# Patient Record
Sex: Female | Born: 1965
Health system: Southern US, Community
[De-identification: ages and names within clinical notes are randomized; demographics above are authoritative.]

## PROBLEM LIST (undated history)

## (undated) DIAGNOSIS — I499 Cardiac arrhythmia, unspecified: Secondary | ICD-10-CM

## (undated) DIAGNOSIS — Z9889 Other specified postprocedural states: Secondary | ICD-10-CM

## (undated) DIAGNOSIS — M1990 Unspecified osteoarthritis, unspecified site: Secondary | ICD-10-CM

## (undated) DIAGNOSIS — T8859XA Other complications of anesthesia, initial encounter: Secondary | ICD-10-CM

## (undated) DIAGNOSIS — R112 Nausea with vomiting, unspecified: Secondary | ICD-10-CM

## (undated) DIAGNOSIS — K219 Gastro-esophageal reflux disease without esophagitis: Secondary | ICD-10-CM

## (undated) DIAGNOSIS — M79606 Pain in leg, unspecified: Secondary | ICD-10-CM

## (undated) DIAGNOSIS — E7800 Pure hypercholesterolemia, unspecified: Secondary | ICD-10-CM

## (undated) HISTORY — PX: VAGINAL HYSTERECTOMY: SUR661

## (undated) HISTORY — PX: SHOULDER ARTHROSCOPY: SHX128

## (undated) HISTORY — DX: Pain in leg, unspecified: M79.606

## (undated) HISTORY — PX: KNEE ARTHROSCOPY: SUR90

## (undated) HISTORY — PX: JOINT REPLACEMENT: SHX530

## (undated) HISTORY — PX: TUBAL LIGATION: SHX77

## (undated) SURGERY — Surgical Case
Anesthesia: *Unknown

---

## 1997-12-03 ENCOUNTER — Ambulatory Visit (HOSPITAL_COMMUNITY): Admission: RE | Admit: 1997-12-03 | Discharge: 1997-12-03 | Payer: Self-pay | Admitting: *Deleted

## 1998-06-21 ENCOUNTER — Observation Stay (HOSPITAL_COMMUNITY): Admission: RE | Admit: 1998-06-21 | Discharge: 1998-06-22 | Payer: Self-pay | Admitting: *Deleted

## 1999-03-11 ENCOUNTER — Other Ambulatory Visit: Admission: RE | Admit: 1999-03-11 | Discharge: 1999-03-11 | Payer: Self-pay | Admitting: *Deleted

## 2000-01-17 ENCOUNTER — Other Ambulatory Visit: Admission: RE | Admit: 2000-01-17 | Discharge: 2000-01-17 | Payer: Self-pay | Admitting: *Deleted

## 2000-07-09 ENCOUNTER — Encounter: Payer: Self-pay | Admitting: *Deleted

## 2000-07-09 ENCOUNTER — Encounter: Admission: RE | Admit: 2000-07-09 | Discharge: 2000-07-09 | Payer: Self-pay | Admitting: *Deleted

## 2001-01-17 ENCOUNTER — Other Ambulatory Visit: Admission: RE | Admit: 2001-01-17 | Discharge: 2001-01-17 | Payer: Self-pay | Admitting: *Deleted

## 2002-02-20 ENCOUNTER — Other Ambulatory Visit: Admission: RE | Admit: 2002-02-20 | Discharge: 2002-02-20 | Payer: Self-pay | Admitting: Obstetrics and Gynecology

## 2002-03-07 ENCOUNTER — Encounter: Payer: Self-pay | Admitting: Obstetrics and Gynecology

## 2002-03-07 ENCOUNTER — Encounter: Admission: RE | Admit: 2002-03-07 | Discharge: 2002-03-07 | Payer: Self-pay | Admitting: Obstetrics and Gynecology

## 2002-04-03 ENCOUNTER — Ambulatory Visit (HOSPITAL_COMMUNITY): Admission: RE | Admit: 2002-04-03 | Discharge: 2002-04-03 | Payer: Self-pay | Admitting: Occupational Medicine

## 2002-04-03 ENCOUNTER — Encounter: Payer: Self-pay | Admitting: Occupational Medicine

## 2002-04-17 ENCOUNTER — Encounter: Admission: RE | Admit: 2002-04-17 | Discharge: 2002-07-16 | Payer: Self-pay | Admitting: Occupational Medicine

## 2002-11-18 ENCOUNTER — Ambulatory Visit (HOSPITAL_BASED_OUTPATIENT_CLINIC_OR_DEPARTMENT_OTHER): Admission: RE | Admit: 2002-11-18 | Discharge: 2002-11-18 | Payer: Self-pay | Admitting: Orthopaedic Surgery

## 2003-06-20 HISTORY — PX: ENDOVENOUS ABLATION SAPHENOUS VEIN W/ LASER: SUR449

## 2005-08-31 ENCOUNTER — Encounter: Admission: RE | Admit: 2005-08-31 | Discharge: 2005-09-14 | Payer: Self-pay | Admitting: Occupational Medicine

## 2007-03-28 ENCOUNTER — Encounter: Admission: RE | Admit: 2007-03-28 | Discharge: 2007-03-28 | Payer: Self-pay | Admitting: Obstetrics and Gynecology

## 2008-04-08 ENCOUNTER — Encounter: Admission: RE | Admit: 2008-04-08 | Discharge: 2008-04-08 | Payer: Self-pay | Admitting: Obstetrics and Gynecology

## 2008-05-27 ENCOUNTER — Ambulatory Visit (HOSPITAL_COMMUNITY): Admission: RE | Admit: 2008-05-27 | Discharge: 2008-05-27 | Payer: Self-pay | Admitting: Obstetrics and Gynecology

## 2008-08-18 ENCOUNTER — Encounter: Admission: RE | Admit: 2008-08-18 | Discharge: 2008-08-18 | Payer: Self-pay | Admitting: Internal Medicine

## 2008-09-16 ENCOUNTER — Ambulatory Visit (HOSPITAL_COMMUNITY): Admission: RE | Admit: 2008-09-16 | Discharge: 2008-09-16 | Payer: Self-pay | Admitting: Internal Medicine

## 2008-11-11 ENCOUNTER — Encounter: Admission: RE | Admit: 2008-11-11 | Discharge: 2008-12-16 | Payer: Self-pay | Admitting: Occupational Medicine

## 2009-04-16 ENCOUNTER — Encounter: Admission: RE | Admit: 2009-04-16 | Discharge: 2009-04-16 | Payer: Self-pay | Admitting: Obstetrics and Gynecology

## 2009-08-17 ENCOUNTER — Emergency Department (HOSPITAL_COMMUNITY): Admission: EM | Admit: 2009-08-17 | Discharge: 2009-08-17 | Payer: Self-pay | Admitting: Family Medicine

## 2010-04-18 ENCOUNTER — Encounter: Admission: RE | Admit: 2010-04-18 | Discharge: 2010-04-18 | Payer: Self-pay | Admitting: Obstetrics and Gynecology

## 2010-06-09 ENCOUNTER — Ambulatory Visit
Admission: RE | Admit: 2010-06-09 | Discharge: 2010-06-09 | Payer: Self-pay | Source: Home / Self Care | Attending: Orthopaedic Surgery | Admitting: Orthopaedic Surgery

## 2010-07-01 ENCOUNTER — Emergency Department (HOSPITAL_COMMUNITY)
Admission: EM | Admit: 2010-07-01 | Discharge: 2010-07-01 | Payer: Self-pay | Source: Home / Self Care | Admitting: Emergency Medicine

## 2010-07-06 ENCOUNTER — Ambulatory Visit
Admission: RE | Admit: 2010-07-06 | Discharge: 2010-07-06 | Payer: Self-pay | Source: Home / Self Care | Attending: Occupational Medicine | Admitting: Occupational Medicine

## 2010-08-29 LAB — POCT HEMOGLOBIN-HEMACUE: Hemoglobin: 15.4 g/dL — ABNORMAL HIGH (ref 12.0–15.0)

## 2010-11-04 NOTE — Op Note (Signed)
NAME:  Samantha Curtis, HOOBLER                            ACCOUNT NO.:  [id number]   MEDICAL RECORD NO.:  [id number]                   PATIENT TYPE:  AMB   LOCATION:  DSC                                  FACILITY:  [facility]   PHYSICIAN:  Lubertha Basque. Jerl Santos, M.D.             DATE OF BIRTH:  [birth year]   DATE OF PROCEDURE:  11/18/2002  DATE OF DISCHARGE:                                 OPERATIVE REPORT   PREOPERATIVE DIAGNOSES:  1. Left shoulder impingement.  2. Left shoulder partial rotator cuff tear.   POSTOPERATIVE DIAGNOSES:  1. Left shoulder impingement.  2. Left shoulder partial rotator cuff tear.   PROCEDURES:  1. Left shoulder arthroscopic acromioplasty.  2. Left shoulder arthroscopic debridement.   ANESTHESIA:  General and block.   SURGEON:  Lubertha Basque. Jerl Santos, M.D.   ASSISTANT:  Lindwood Qua, P.A.   INDICATION FOR PROCEDURE:  The patient is a 45 year old woman with more than  six months of left shoulder pain related to an accident at work.  This has  persisted despite oral anti-inflammatories, physical therapy, and a  subacromial injection which did afford her transient relief.  She has  undergone an MRI scan, which shows things consistent with impingement.  She  is offered an arthroscopy.  She has pain at rest and pain with use of the  arm.  Informed operative consent was obtained after discussion of the  possible complications of, reaction to anesthesia, and infection.   DESCRIPTION OF PROCEDURE:  The patient was taken to the operating suite,  where a general anesthetic was applied without difficulty.  She was also  given a block in the preanesthesia area.  She was positioned in the beach  chair position and prepped and draped in normal sterile fashion.  After the  administration of preoperative IV antibiotics, an arthroscopy of the left  shoulder was performed through a total of two portals.  The glenohumeral  joint showed no degenerative change, and the biceps tendon and  labral  structures were all intact.  She had a normal-appearing rotator cuff from  below.  In the subacromial space she had some significant bursitis,  addressed with a thorough bursectomy.  She had an irritated area in her  rotator cuff consistent with a small partial-thickness tear, addressed with  a brief debridement.  She had a prominent subacromial morphology especially  due to the fact that she had an os acromiale, which did add to her  impingement.  An acromioplasty was done, and this left just a small portion  of this os acromiale behind so it was felt prudent to go ahead and remove  the rest of this structure, so that was done.  The acromioplasty was done  flat from the lateral aspect, and then the bur was placed in the posterior  aspect as a finishing task.  The [place] joint was addressed, as she had no pain  in that location.  The shoulder was thoroughly irrigated at the end of the  case, followed by placement of Marcaine with epinephrine.  Simple sutures of  nylon were used to loosely reapproximate the portals, followed by Adaptic  and a dry gauze dressing with tape.  Estimated blood loss and intraoperative  fluids can be obtained from anesthesia records.   DISPOSITION:  The patient was extubated in the operating room and taken to  the recovery room in stable condition.  Plans were for her to go home the  same day and to follow up in the office in less than a week.  I will contact  her by phone tonight.                                               Lubertha Basque Jerl Santos, M.D.    PGD/MEDQ  D:  11/18/2002  T:  11/18/2002  Job:  [id number]

## 2011-03-13 ENCOUNTER — Other Ambulatory Visit (HOSPITAL_COMMUNITY): Payer: Self-pay | Admitting: Obstetrics and Gynecology

## 2011-03-13 DIAGNOSIS — Z1231 Encounter for screening mammogram for malignant neoplasm of breast: Secondary | ICD-10-CM

## 2011-03-24 LAB — LIPID PANEL: VLDL: 13 mg/dL (ref 0–40)

## 2011-03-24 LAB — TSH: TSH: 1.055 u[IU]/mL (ref 0.350–4.500)

## 2011-04-20 ENCOUNTER — Ambulatory Visit (HOSPITAL_COMMUNITY)
Admission: RE | Admit: 2011-04-20 | Discharge: 2011-04-20 | Disposition: A | Discharge status: Home / Self Care | Payer: 59 | Source: Ambulatory Visit | Attending: Obstetrics and Gynecology | Admitting: Obstetrics and Gynecology

## 2011-04-20 DIAGNOSIS — Z1231 Encounter for screening mammogram for malignant neoplasm of breast: Secondary | ICD-10-CM

## 2012-03-11 ENCOUNTER — Other Ambulatory Visit (HOSPITAL_COMMUNITY): Payer: Self-pay | Admitting: Obstetrics and Gynecology

## 2012-03-11 DIAGNOSIS — Z1231 Encounter for screening mammogram for malignant neoplasm of breast: Secondary | ICD-10-CM

## 2012-04-22 ENCOUNTER — Ambulatory Visit (HOSPITAL_COMMUNITY)
Admission: RE | Admit: 2012-04-22 | Discharge: 2012-04-22 | Disposition: A | Discharge status: Home / Self Care | Payer: 59 | Source: Ambulatory Visit | Attending: Obstetrics and Gynecology | Admitting: Obstetrics and Gynecology

## 2012-04-22 DIAGNOSIS — Z1231 Encounter for screening mammogram for malignant neoplasm of breast: Secondary | ICD-10-CM | POA: Insufficient documentation

## 2013-03-18 ENCOUNTER — Other Ambulatory Visit (HOSPITAL_COMMUNITY): Payer: Self-pay | Admitting: Obstetrics and Gynecology

## 2013-03-18 DIAGNOSIS — Z1231 Encounter for screening mammogram for malignant neoplasm of breast: Secondary | ICD-10-CM

## 2013-04-02 ENCOUNTER — Ambulatory Visit (INDEPENDENT_AMBULATORY_CARE_PROVIDER_SITE_OTHER): Payer: 59 | Admitting: Emergency Medicine

## 2013-04-02 ENCOUNTER — Encounter: Payer: Self-pay | Admitting: Emergency Medicine

## 2013-04-02 VITALS — BP 114/79 | Ht 63.0 in | Wt 145.0 lb

## 2013-04-02 DIAGNOSIS — M7710 Lateral epicondylitis, unspecified elbow: Secondary | ICD-10-CM

## 2013-04-02 DIAGNOSIS — M7711 Lateral epicondylitis, right elbow: Secondary | ICD-10-CM | POA: Insufficient documentation

## 2013-04-02 NOTE — Progress Notes (Signed)
Patient ID: Samantha Curtis, female   DOB: Oct 16, 1965, 47 y.o.   MRN: [id number] 47 year old female with complaint of progressively worsening right elbow pain. Pain worse when doing heavy lifting. Noted on lateral aspect of her right elbow. Denies specific inciting injury. Has been taking anti-inflammatories with minimal relief. 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 Has been icing at night without significant relief. No radiation of pain.  No past medical history on file. No past surgical history on file. No Known Allergies  Review of systems as per history of present illness otherwise negative  Examination: BP 114/79  Ht 5\' 3"  (1.6 m)  Wt 145 lb (65.772 kg)  BMI 25.69 kg/m2 Well-developed well-nourished 47 year old female awake alert oriented no acute distress  Right Elbow: Unremarkable to inspection. Range of motion full pronation, supination, flexion, extension. Strength is full to all of the above directions Stable to varus, valgus stress. Negative moving valgus stress test. Tenderness to palpation noted over the lateral upper condyle. Ulnar nerve does not sublux. Negative cubital tunnel Tinel's.   Muscular skeletal ultrasound performed in the office of the right elbow with images obtained in the lateral epicondyle.  Findings consistent with lateral epicondylitis with fluid around the tendon. No evidence of acute tendon rupture.

## 2013-04-02 NOTE — Assessment & Plan Note (Signed)
Patient given lateral epicondylitis exercises and stretching to do at home. Recommended ice and continued anti-inflammatories as needed. Body helix compression sleeve for the right elbow given. She'll followup in one month

## 2013-04-16 ENCOUNTER — Ambulatory Visit: Payer: 59 | Admitting: Emergency Medicine

## 2013-04-23 ENCOUNTER — Ambulatory Visit (HOSPITAL_COMMUNITY): Payer: 59

## 2013-04-28 ENCOUNTER — Ambulatory Visit (HOSPITAL_COMMUNITY)
Admission: RE | Admit: 2013-04-28 | Discharge: 2013-04-28 | Disposition: A | Discharge status: Home / Self Care | Payer: 59 | Source: Ambulatory Visit | Attending: Obstetrics and Gynecology | Admitting: Obstetrics and Gynecology

## 2013-04-28 DIAGNOSIS — Z1231 Encounter for screening mammogram for malignant neoplasm of breast: Secondary | ICD-10-CM | POA: Insufficient documentation

## 2013-04-30 ENCOUNTER — Other Ambulatory Visit: Payer: Self-pay | Admitting: Obstetrics and Gynecology

## 2013-04-30 DIAGNOSIS — R928 Other abnormal and inconclusive findings on diagnostic imaging of breast: Secondary | ICD-10-CM

## 2013-05-20 ENCOUNTER — Ambulatory Visit
Admission: RE | Admit: 2013-05-20 | Discharge: 2013-05-20 | Disposition: A | Discharge status: Home / Self Care | Payer: 59 | Source: Ambulatory Visit | Attending: Obstetrics and Gynecology | Admitting: Obstetrics and Gynecology

## 2013-05-20 DIAGNOSIS — R928 Other abnormal and inconclusive findings on diagnostic imaging of breast: Secondary | ICD-10-CM

## 2014-03-18 ENCOUNTER — Other Ambulatory Visit (HOSPITAL_COMMUNITY): Payer: Self-pay | Admitting: Obstetrics and Gynecology

## 2014-03-18 DIAGNOSIS — Z1231 Encounter for screening mammogram for malignant neoplasm of breast: Secondary | ICD-10-CM

## 2014-04-30 ENCOUNTER — Ambulatory Visit (HOSPITAL_COMMUNITY)
Admission: RE | Admit: 2014-04-30 | Discharge: 2014-04-30 | Disposition: A | Discharge status: Home / Self Care | Payer: 59 | Source: Ambulatory Visit | Attending: Obstetrics and Gynecology | Admitting: Obstetrics and Gynecology

## 2014-04-30 DIAGNOSIS — Z1231 Encounter for screening mammogram for malignant neoplasm of breast: Secondary | ICD-10-CM | POA: Diagnosis present

## 2014-08-31 DIAGNOSIS — R079 Chest pain, unspecified: Secondary | ICD-10-CM | POA: Insufficient documentation

## 2014-08-31 DIAGNOSIS — R002 Palpitations: Secondary | ICD-10-CM | POA: Insufficient documentation

## 2014-09-02 ENCOUNTER — Other Ambulatory Visit: Payer: Self-pay

## 2014-09-02 DIAGNOSIS — R002 Palpitations: Secondary | ICD-10-CM

## 2014-09-04 ENCOUNTER — Encounter (INDEPENDENT_AMBULATORY_CARE_PROVIDER_SITE_OTHER): Payer: 59

## 2014-09-04 ENCOUNTER — Encounter: Payer: Self-pay | Admitting: Radiology

## 2014-09-04 DIAGNOSIS — R002 Palpitations: Secondary | ICD-10-CM

## 2014-09-04 NOTE — Progress Notes (Signed)
Patient ID: Samantha Curtis, female   DOB: 04-24-1966, 49 y.o.   MRN: [id number] [employer] 48hr holter applied

## 2014-09-17 ENCOUNTER — Telehealth: Payer: Self-pay

## 2014-09-17 NOTE — Telephone Encounter (Signed)
error 

## 2014-09-24 ENCOUNTER — Encounter: Payer: Self-pay | Admitting: *Deleted

## 2014-10-12 ENCOUNTER — Ambulatory Visit (INDEPENDENT_AMBULATORY_CARE_PROVIDER_SITE_OTHER): Payer: 59 | Admitting: Sports Medicine

## 2014-10-12 ENCOUNTER — Encounter: Payer: Self-pay | Admitting: Sports Medicine

## 2014-10-12 VITALS — BP 145/92 | HR 81 | Ht 63.0 in | Wt 147.0 lb

## 2014-10-12 DIAGNOSIS — M25571 Pain in right ankle and joints of right foot: Secondary | ICD-10-CM

## 2014-10-12 NOTE — Progress Notes (Signed)
   Subjective:    Patient ID: Samantha Curtis, female    DOB: 10-30-1965, 49 y.o.   MRN: [id number]  HPI  chief complaint: Right foot pain  49 year old female comes in today complaining of several months of right foot pain. No injury that she can recall but gradual onset of pain that she localizes to the medial foot. She localizes it primarily to the medial most aspect of her first metatarsal. She does have some radiating pain into her arch proximally as well as but denies any pain at the heel. She has not noticed any swelling. Her pain is much worse when standing for long periods of time which she has to do as part of her job. 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 She takes naproxen sodium on an occasional basis and does find to be helpful. She denies numbness or tingling. Some pain at rest. She is also started using a frozen water bottle to ice her foot and has found that to be helpful as well.  Past medical history reviewed Medications reviewed Allergies reviewed    Review of Systems     Objective:   Physical Exam Well-developed, well-nourished. No acute distress. Vital signs reviewed.  Right foot: Slight pes planus but there is collapse of the transverse arches bilaterally. She is tender to palpation along the medial most aspect of the first metatarsal head as well as along the course of the first metatarsal itself. Some tenderness in the plantar fashion this area as well. No soft tissue swelling. No tenderness to palpation at the calcaneal insertion of the plantar fascia. No pain with metatarsal squeeze. Neurovascular intact distally. Walking without significant limp.       Assessment & Plan:  Foot pain secondary to mild pes planus and transverse arch collapse  Patient did not bring her tennis shoes with her today. I've asked her to return to the office in the next several days to that we can fit those with green sports insoles with both scaphoid pad and metatarsal pads  bilaterally. She will take naproxen sodium 500 mg once daily with food for 7 days. I provided her with an arch strap to wear with activity and she will follow-up with me in 4 weeks. If symptoms persist I would consider imaging both in the form of ultrasound as well as with x-rays. Call with questions or concerns prior to her follow-up visit.  Of note, she was also complaining of some medial right elbow pain. Brief exam showed tenderness to palpation directly over the medial epicondyle. I've explained to her that her naproxen sodium along with slightly altering her work-related activity should hopefully help with this. We will reevaluate this further at follow-up.

## 2014-11-09 ENCOUNTER — Encounter: Payer: Self-pay | Admitting: Sports Medicine

## 2014-11-09 ENCOUNTER — Ambulatory Visit (INDEPENDENT_AMBULATORY_CARE_PROVIDER_SITE_OTHER): Payer: 59 | Admitting: Sports Medicine

## 2014-11-09 VITALS — BP 105/67 | HR 73 | Ht 63.0 in | Wt 147.0 lb

## 2014-11-09 DIAGNOSIS — M7701 Medial epicondylitis, right elbow: Secondary | ICD-10-CM | POA: Diagnosis not present

## 2014-11-09 NOTE — Progress Notes (Signed)
   Subjective:    Patient ID: Samantha Curtis, female    DOB: Aug 16, 1965, 49 y.o.   MRN: [id number]  HPI   Patient comes in today for follow-up on right foot pain. Pain is about 90% improved. She found the green sports insoles and scaphoid pads uncomfortable so she purchased a different off-the-shelf insert which has been much more comfortable. Her pain is primarily now on the arch of the foot. Worse with first step and standing for long periods of time.  Her main complaint today is persistent medial sided right elbow pain. Symptoms have been present now for 6 months. It is worse with wrist related activity. 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 No associated numbness or tingling.    Review of Systems     Objective:   Physical Exam Well developed, well-nourished. No acute distress  Right foot: Patient is tender to palpation along the arch of the foot. No soft tissue swelling. No tenderness at the first MTP nor at the heel. Negative calcaneal squeeze.  Right elbow: Full range of motion. No effusion. No soft tissue swelling. She is tender to palpation at the origin of the common flexor tendon. No other bony or soft tissue tenderness to direct palpation. Neurovascularly intact distally.       Assessment & Plan:  Improved right foot pain secondary to plantar fasciitis Right elbow pain secondary to medial epicondylitis versus partial common flexor tendon tear  Patient will continue with her off-the-shelf inserts. She will start some simple plantar fascial stretching as well. For her chronic medial elbow pain I've referred [facility] Ave [facility]. Follow-up with me when necessary.

## 2014-11-30 ENCOUNTER — Other Ambulatory Visit: Payer: Self-pay | Admitting: Orthopedic Surgery

## 2014-11-30 DIAGNOSIS — M8580 Other specified disorders of bone density and structure, unspecified site: Secondary | ICD-10-CM

## 2014-12-02 ENCOUNTER — Ambulatory Visit: Payer: 59 | Attending: Orthopedic Surgery

## 2014-12-02 DIAGNOSIS — M25521 Pain in right elbow: Secondary | ICD-10-CM | POA: Diagnosis present

## 2014-12-02 DIAGNOSIS — R29898 Other symptoms and signs involving the musculoskeletal system: Secondary | ICD-10-CM

## 2014-12-02 DIAGNOSIS — M6281 Muscle weakness (generalized): Secondary | ICD-10-CM | POA: Diagnosis not present

## 2014-12-02 NOTE — Telephone Encounter (Signed)
Erroneous encounter

## 2014-12-02 NOTE — Therapy (Addendum)
[facility] [address], [address] [place], [place], [address] Phone: [phone]   Fax:  [phone]  Physical Therapy Treatment  Patient Details  Name: Samantha Curtis MRN: [id number] Date of Birth: 1966/03/24 Referring Provider:  Jones Broom, MD  Encounter Date: 12/02/2014      PT End of Session - 12/02/14 1607    Visit Number 1   Date for PT Re-Evaluation 12/30/14   PT Start Time 1515   PT Stop Time 1600   PT Time Calculation (min) 45 min   Activity Tolerance Patient tolerated treatment well   Behavior During Therapy [facility] for tasks assessed/performed      History reviewed. No pertinent past medical history.  Past Surgical History  Procedure Laterality Date  . Abdominal hysterectomy      There were no vitals filed for this visit.  Visit Diagnosis:  Elbow pain, right - Plan: PT plan of care cert/re-cert  Decreased grip strength of right hand - Plan: PT plan of care cert/re-cert      Subjective Assessment - 12/02/14 1521    Subjective Pain began 6 months ago; noticed decreased ability to squeeze objects and increased tenderness at the elbow. 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    Patient Stated Goals Pain relief, strengthenig home exericse program    Currently in Pain? Yes   Pain Score 2    Pain Location Elbow   Pain Orientation Right   Pain Descriptors / Indicators Dull;Aching   Pain Type Chronic pain   Pain Onset More than a month ago   Pain Frequency Constant   Aggravating Factors  Work activities; tenderness to activities; lifting and gripping 10 lbs X-ray detector, lifting items, gripping items    Pain Relieving Factors Injection, medication, Voltran gel             [facility] PT Assessment - 12/02/14 0001    Assessment   Medical Diagnosis M77.01 -Rt golfer's elbow   Onset Date/Surgical Date 06/02/14   Hand Dominance Right   Next MD Visit July 18th    Prior Therapy Previous PT  shoulder/knee replacement   Precautions   Precautions None   Restrictions   Weight Bearing Restrictions No   Balance Screen   Has the patient fallen in the past 6 months No   Has the patient had a decrease in activity level because of a fear of falling?  No   Is the patient reluctant to leave their home because of a fear of falling?  No   Home Tourist information centre manager residence   Prior Function   Level of Independence Independent   Cognition   Overall Cognitive Status Within Functional Limits for tasks assessed   Observation/Other Assessments   Focus on Therapeutic Outcomes (FOTO)  37%    ROM / Strength   AROM / PROM / Strength AROM;Strength;PROM   AROM   Overall AROM  Within functional limits for tasks performed  Full wrist/shoulder/elbow ROM    PROM   Overall PROM  Within functional limits for tasks performed   Strength   Overall Strength Within functional limits for tasks performed  5/5 for bilateral UE motions   Overall Strength Comments Lt grip strength: 60 lbs; Rt grip strength: 40 lbs    Palpation   Palpation comment tenderness to palpation at medial epicondyle  [facility] Adult PT Treatment/Exercise - 12/02/14 0001    Modalities   Modalities Ultrasound   Ultrasound   Ultrasound Location Rt. medial epicondyle   Ultrasound Parameters 3.3Mhz; 20% duty cycle; 1.0 W/cm2   Ultrasound Goals Pain                PT Education - 12/02/14 1553    Education provided Yes   Education Details HEP: grip (verbal education),wrist flexion, extension and radial deviation, elbow flexion stretch   Person(s) Educated Patient   Methods Explanation;Demonstration;Handout   Comprehension Verbalized understanding;Returned demonstration             PT Long Term Goals - 12/02/14 1619    PT LONG TERM GOAL #1   Title Independent with HEP    Time 4   Period Weeks   Status New   PT LONG TERM GOAL #2   Title Increase Rt. grip  strength to 50 lbs. 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 elbow pain to 1/10 in order to proper UE exercise program.    Time 4   Period Weeks   Status New   PT LONG TERM GOAL #4   Title Verbalize understanding of how to intiate and perform an exercise program for strength    Time 4   Period Weeks   Status New               Plan - 12/02/14 1607    Clinical Impression Statement 49 y.o woman presents with pain at medial epicondyle that began 6 months ago; received injection on 6/1 that has decreased overall pain. Noted decreased grip strength on Rt. compared ot Lt.  Pain and decreased strength has been limiting her ability to perform work activities.  Will benefit skilled PT for  pain management and UE strengthening.    Pt will benefit from skilled therapeutic intervention in order to improve on the following deficits Pain;Decreased strength;Decreased activity tolerance   PT Frequency 1x / week   PT Duration 4 weeks   PT Treatment/Interventions Iontophoresis /ml Dexamethasone;Moist Heat;Ultrasound;Electrical Stimulation;Neuromuscular re-education;Therapeutic exercise;Manual techniques;Passive range of motion;Patient/family education;Energy conservation   PT Next Visit Plan Biceps/triceps strengthening, wrist strengthening exercises, ultrasound.  Pt would like to build HEP to perform UE strength at home   Consulted and Agree with Plan of Care Patient        Problem List Patient Active Problem List   Diagnosis Date Noted  . Lateral epicondylitis of right elbow 04/02/2013   Reyes Ivan, SPT 12/03/2014 7:12 AM During this treatment session, the therapist was present, participating in, and directing the treatment. TAKACS,KELLY 12/03/2014, 7:12 AM  [facility] [address] [place], [place],  [address] Phone: [phone]   Fax:  [phone]

## 2014-12-02 NOTE — Patient Instructions (Signed)
AROM: Elbow Flexion / Extension   With left hand palm up, gently bend elbow as far as possible. Then straighten arm as far as possible.  Hold 10 seconds Repeat _5__ times per set. Do ____ sets per session. Do __3__ sessions per day.  Copyright  VHI. All rights reserved.  Wrist Flexor Stretch   Keeping elbow straight, grasp left hand and slowly bend wrist back until stretch is felt. Hold __10-20__ seconds. Relax. Repeat _3___ times per set. Do ____ sets per session. Do __3__ sessions per day.  Copyright  VHI. All rights reserved.  Wrist Extensor Stretch   Keeping elbow straight, grasp left hand and slowly bend wrist forward until stretch is felt. Hold __10-20__ seconds. Relax. Repeat __3__ times per set. Do ____ sets per session. Do __3__ sessions per day.  Copyright  VHI. All rights reserved.  Extension: Wrist   Right forearm on thigh, wrist extending off knee, palm down, curl wrist up. Repeat _10___ times per set. Do _2___ sets per session. Do 1-2x/day Use _1-2___ lb weight (or soup can/can of beans)  Copyright  VHI. All rights reserved.  Flexion (Resistive)   With hand palm-up and holding can or 1-2# weight bend hand toward you at wrist. Hold ____ seconds. Relax slowly. Repeat __2x10__ times. Do __1-2__ sessions per day.  Copyright  VHI. All rights reserved.  Radial Deviation (Resistive)   Holding 1-2# or can, bend wrist upward, with thumb pointing toward you. Hold ____ seconds. Repeat _2x10___ times. Do _1-2___ sessions per day. Activity: Use this movement to pick up a cup.*  Copyright  VHI. All rights reserved.  [facility] [address] [place], [place] [address] Phone # [phone] Fax [phone]

## 2014-12-04 ENCOUNTER — Ambulatory Visit
Admission: RE | Admit: 2014-12-04 | Discharge: 2014-12-04 | Disposition: A | Discharge status: Home / Self Care | Payer: 59 | Source: Ambulatory Visit | Attending: Orthopedic Surgery | Admitting: Orthopedic Surgery

## 2014-12-04 DIAGNOSIS — M8580 Other specified disorders of bone density and structure, unspecified site: Secondary | ICD-10-CM

## 2014-12-16 ENCOUNTER — Ambulatory Visit: Payer: 59 | Admitting: Physical Therapy

## 2014-12-16 ENCOUNTER — Encounter: Payer: Self-pay | Admitting: Physical Therapy

## 2014-12-16 DIAGNOSIS — R29898 Other symptoms and signs involving the musculoskeletal system: Secondary | ICD-10-CM

## 2014-12-16 DIAGNOSIS — M25521 Pain in right elbow: Secondary | ICD-10-CM | POA: Diagnosis not present

## 2014-12-16 NOTE — Therapy (Signed)
[facility] [address] [place], [place], [address] Phone: [phone]   Fax:  [phone]  Physical Therapy Treatment  Patient Details  Name: Samantha Curtis MRN: [id number] Date of Birth: [birth year] Referring Provider:  Kelton Pillar, MD  Encounter Date: 12/16/2014      PT End of Session - 12/16/14 1525    Visit Number 2   Date for PT Re-Evaluation 12/30/14   PT Start Time 5366   PT Stop Time 1336   PT Time Calculation (min) 1362 min   Activity Tolerance Patient tolerated treatment well   Behavior During Therapy [facility] for tasks assessed/performed      History reviewed. No pertinent past medical history.  Past Surgical History  Procedure Laterality Date  . Abdominal hysterectomy      There were no vitals filed for this visit.  Visit Diagnosis:  Elbow pain, right  Decreased grip strength of right hand      Subjective Assessment - 12/16/14 1455    Subjective Relief from injection has wore off totally. She reports hurting her wrist possibly from over stretching.   Pain Score 10-Worst pain ever   Pain Location Elbow   Pain Orientation Right   Pain Descriptors / Indicators Burning   Aggravating Factors  Touching it,    Pain Relieving Factors Meds, nothing last few days                         [facility] Adult PT Treatment/Exercise - 12/16/14 0001    Elbow Exercises   Other elbow exercises Red digi flex 3x10    Modalities   Modalities Ultrasound   Moist Heat Therapy   Number Minutes Moist Heat 10 Minutes   Moist Heat Location Elbow   Electrical Stimulation   Electrical Stimulation Location RT elbow   Electrical Stimulation Action IFC   Electrical Stimulation Goals Pain   Ultrasound   Ultrasound Location Rt medial elbow   Ultrasound Parameters 3 Mz 20%, .7wt/cm2   Ultrasound Goals Pain   Iontophoresis   Type of Iontophoresis Dexamethasone   Location RT medial elbow   Dose 1 ml   Time 6hr  Skin intact but swollen                PT Education - 12/16/14 1519    Education provided Yes   Education Details postures and positions at work that decrease/increase loads at elbow. Ionto precautions   Person(s) Educated Patient   Methods Explanation;Demonstration;Handout   Comprehension Verbalized understanding;Returned demonstration             PT Long Term Goals - 12/16/14 1528    PT LONG TERM GOAL #1   Title Independent with HEP    Time 4   Period Weeks   Status On-going   PT LONG TERM GOAL #2   Title Increase Rt. grip strength to 50 lbs. 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 elbow pain to 1/10 in order to proper UE exercise program.    Time 4   Period Weeks   Status On-going  10/10 today   PT LONG TERM GOAL #4   Title Verbalize understanding of how to intiate and perform an exercise program for strength    Time 4   Status On-going  Plan - 12/16/14 1525    Clinical Impression Statement Pt pain increased significantly today. Medial elbow very tender to touch, in fact pt doesn't want anyone to touch due to sensitivity. No goals met today due to pain. She gets to rest  next week being on vaction so she was instructed in rest, ice or heat, whichever felt better.    Pt will benefit from skilled therapeutic intervention in order to improve on the following deficits Pain;Decreased strength;Decreased activity tolerance   PT Frequency 1x / week   PT Duration 4 weeks   PT Treatment/Interventions Iontophoresis 4mg /ml Dexamethasone;Moist Heat;Ultrasound;Electrical Stimulation;Neuromuscular re-education;Therapeutic exercise;Manual techniques;Passive range of motion;Patient/family education;Energy conservation   PT Next Visit Plan See how pain is first, then proceed. #2 ionto   Consulted and Agree with Plan of  Care Patient        Problem List Patient Active Problem List   Diagnosis Date Noted  . Lateral epicondylitis of right elbow 04/02/2013    Audrick Lamoureaux, PTA 12/16/2014, 3:37 PM  [facility] [address] [place], [place], [address] Phone: [phone]   Fax:  [phone]

## 2014-12-16 NOTE — Patient Instructions (Signed)

## 2014-12-30 ENCOUNTER — Ambulatory Visit: Payer: 59 | Attending: Orthopedic Surgery | Admitting: Physical Therapy

## 2014-12-30 ENCOUNTER — Encounter: Payer: Self-pay | Admitting: Physical Therapy

## 2014-12-30 DIAGNOSIS — M6281 Muscle weakness (generalized): Secondary | ICD-10-CM | POA: Insufficient documentation

## 2014-12-30 DIAGNOSIS — M25521 Pain in right elbow: Secondary | ICD-10-CM | POA: Insufficient documentation

## 2014-12-30 NOTE — Therapy (Signed)
[facility] [address], [address] [place], [place], [address] Phone: [phone]   Fax:  [phone]  Physical Therapy Treatment  Patient Details  Name: Samantha Curtis MRN: [id number] Date of Birth: 12-09-1965 Referring Provider:  Maurice Small, MD  Encounter Date: 12/30/2014      PT End of Session - 12/30/14 1443    Visit Number 3   Date for PT Re-Evaluation 12/30/14   PT Start Time 1404   PT Stop Time 1445   PT Time Calculation (min) 41 min   Activity Tolerance Patient tolerated treatment well   Behavior During Therapy [facility] for tasks assessed/performed      History reviewed. No pertinent past medical history.  Past Surgical History  Procedure Laterality Date  . Abdominal hysterectomy      There were no vitals filed for this visit.  Visit Diagnosis:  Elbow pain, right      Subjective Assessment - 12/30/14 1408    Subjective While on vacation pt had no elbow pain. She went back to work Monday with an increase in symptoms. She is able to implement some of the techniques we discussed regarding load reduction at elbow. Patches really helped.   Currently in Pain? Yes   Pain Score 6    Pain Location Elbow   Pain Orientation Right   Pain Descriptors / Indicators Burning   Aggravating Factors  Touching it   Pain Relieving Factors Ionto, meds, ice   Multiple Pain Sites No            [facility] PT Assessment - 12/30/14 0001    Strength   Overall Strength Comments RT 60#                     [facility] Adult PT Treatment/Exercise - 12/30/14 0001    Ultrasound   Ultrasound Location Rt medial elbow   Ultrasound Parameters 3.3 Mhz 20% .7cm2   Ultrasound Goals Pain   Iontophoresis   Type of Iontophoresis Dexamethasone   Location RT medial elbow   Dose 1 ml   Time 6 hr, pt verbally understands wear time  Skin intact   Manual Therapy   Manual Therapy --  Ice massage with probe x 10 min                 PT Education - 12/30/14 1453    Education provided Yes   Education Details Benefits, how and where to purchase golfers elbow strap to wear at work.    Person(s) Educated Patient   Methods Explanation;Demonstration;Tactile cues;Verbal cues;Handout   Comprehension Verbalized understanding;Returned demonstration             PT Long Term Goals - 12/30/14 1448    PT LONG TERM GOAL #1   Title Independent with HEP    Time 4   Period Weeks   Status On-going   PT LONG TERM GOAL #2   Title Increase Rt. grip strength to 50 lbs. 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 Work duties absolutely increase her symtpoms BUT by implementing some load reducing techniques as much as she can, she reports this has been helpful so her pain does not increase to where it was two weeks ago.     Pt will benefit from skilled therapeutic intervention in order to improve on the following deficits Pain;Decreased strength;Decreased activity tolerance   PT Frequency 1x / week   PT Duration 4 weeks   PT Treatment/Interventions Iontophoresis 4mg /ml Dexamethasone;Moist Heat;Ultrasound;Electrical Stimulation;Neuromuscular re-education;Therapeutic exercise;Manual techniques;Passive range of motion;Patient/family education;Energy conservation   PT Next Visit Plan Ionto, US, ice massage to medial elbow if pain still high.   Consulted and Agree with Plan of Care Patient         Problem List Patient Active Problem List   Diagnosis Date Noted  . Lateral epicondylitis of right elbow 04/02/2013    Ledon Weihe, PTA 12/30/2014, 2:55 PM  [facility] [facility] [address], [address] [place], [place], [address] Phone: [phone]   Fax:  [phone]

## 2015-01-04 ENCOUNTER — Ambulatory Visit: Payer: 59 | Admitting: Physical Therapy

## 2015-01-04 DIAGNOSIS — M25521 Pain in right elbow: Secondary | ICD-10-CM

## 2015-01-04 DIAGNOSIS — R29898 Other symptoms and signs involving the musculoskeletal system: Secondary | ICD-10-CM

## 2015-01-04 NOTE — Therapy (Signed)
[facility] [address], [address] [place], [place], [address] Phone: [phone]   Fax:  [phone]  Physical Therapy Treatment  Patient Details  Name: Samantha MartRana S Wiacek MRN: [id number] Date of Birth: 09-17-1965 Referring Provider:  Maurice SmallGriffin, Elaine, MD  Encounter Date: 01/04/2015      PT End of Session - 01/04/15 1537    Visit Number 4   Date for PT Re-Evaluation 12/30/14   PT Start Time 1530   PT Stop Time 1615   PT Time Calculation (min) 45 min   Activity Tolerance Patient tolerated treatment well   Behavior During Therapy [facility] for tasks assessed/performed      No past medical history on file.  Past Surgical History  Procedure Laterality Date  . Abdominal hysterectomy      There were no vitals filed for this visit.  Visit Diagnosis:  Elbow pain, right  Decreased grip strength of right hand      Subjective Assessment - 01/04/15 1533    Subjective Pt has pain on med epicondyle, she bought a tennis elbow Gel/Air support. Ice massage and Ionto helped   Currently in Pain? Yes   Pain Score --  up to 9/10   Pain Location Elbow   Pain Orientation Right;Mid   Pain Descriptors / Indicators Burning   Pain Type Chronic pain   Pain Onset More than a month ago   Pain Frequency Constant   Aggravating Factors  touching it, lifting as a small watermelon, or the films at work   Pain Relieving Factors ionto, meds, ice, braces   Multiple Pain Sites No                         [facility] Adult PT Treatment/Exercise - 01/04/15 0001    Modalities   Modalities Ultrasound   Ultrasound   Ultrasound Location Rt medialepicondyle   Ultrasound Parameters 20%, 3.3Mhz, 0.8 W/cm   Ultrasound Goals Pain   Iontophoresis   Type of Iontophoresis Dexamethasone   Location RT medial elbow   Dose # 3,  1 ml   Time 6 hr, pt verbally understands wear time   Manual Therapy   Manual Therapy Soft tissue mobilization  gentle  crossfriction to Rt med elbow, and softittuework to F   Manual therapy comments Triceps stretch and posterior capsule stretch   Soft tissue mobilization used ice massage b/w to reduce pain                PT Education - 01/04/15 1619    Education provided Yes   Education Details Iontophoresis precautions   Person(s) Educated Patient   Methods Explanation   Comprehension Verbalized understanding             PT Long Term Goals - 01/04/15 1539    PT LONG TERM GOAL #1   Title Independent with HEP    Time 4   Period Weeks   Status On-going   PT LONG TERM GOAL #2   Title Increase Rt. grip strength to 50 lbs. 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 elbow pain to 1/10 in order to proper UE exercise program.    Time 4   Period Weeks   Status On-going   PT LONG TERM GOAL #4   Title Verbalize understanding of  how to intiate and perform an exercise program for strength    Time 4   Period Weeks   Status On-going               Plan - 01/04/15 1538    Clinical Impression Statement Pt unable to perform stretches since that triggers pain, but has benefits from Ultrasound, Ionto and manual   Pt will benefit from skilled therapeutic intervention in order to improve on the following deficits Pain;Decreased strength;Decreased activity tolerance   Rehab Potential Good   PT Frequency 1x / week   PT Duration 4 weeks   PT Treatment/Interventions Iontophoresis /ml Dexamethasone;Moist Heat;Ultrasound;Electrical Stimulation;Neuromuscular re-education;Therapeutic exercise;Manual techniques;Passive range of motion;Patient/family education;Energy conservation   PT Next Visit Plan Ionto, [place], ice massage to medial elbow if pain still high.   Consulted and Agree with Plan of Care Patient        Problem List Patient Active Problem List   Diagnosis Date Noted  .  Lateral epicondylitis of right elbow 04/02/2013    NAUMANN-HOUEGNIFIO,Rogue Pautler PTA 01/04/2015, 4:23 PM  [facility] [address] [place], [place], [address] Phone: [phone]   Fax:  [phone]

## 2015-01-12 ENCOUNTER — Ambulatory Visit: Payer: 59

## 2015-01-12 DIAGNOSIS — M25521 Pain in right elbow: Secondary | ICD-10-CM

## 2015-01-12 DIAGNOSIS — R29898 Other symptoms and signs involving the musculoskeletal system: Secondary | ICD-10-CM

## 2015-01-12 NOTE — Therapy (Signed)
[facility] [address] [place], [place], [address] Phone: [phone]   Fax:  [phone]  Physical Therapy Treatment  Patient Details  Name: Samantha Curtis MRN: [id number] Date of Birth: July 28, 1965 Referring Provider:  Tania Ade, MD  Encounter Date: 01/12/2015      PT End of Session - 01/12/15 1531    Visit Number 5   Date for PT Re-Evaluation 02/12/15   PT Start Time 1500   PT Stop Time 1531   PT Time Calculation (min) 31 min   Activity Tolerance Patient limited by pain   Behavior During Therapy [facility] for tasks assessed/performed      No past medical history on file.  Past Surgical History  Procedure Laterality Date  . Abdominal hysterectomy      There were no vitals filed for this visit.  Visit Diagnosis:  Decreased grip strength of right hand - Plan: PT plan of care cert/re-cert  Elbow pain, right - Plan: PT plan of care cert/re-cert      Subjective Assessment - 01/12/15 1508    Subjective Pt reports very minimal change in symptoms since the start of care.     Currently in Pain? Yes   Pain Score 4   up to 8/10 with use   Pain Location Elbow   Pain Orientation Right;Mid   Pain Descriptors / Indicators Burning   Pain Type Chronic pain   Pain Onset More than a month ago   Pain Frequency Constant   Aggravating Factors  lifting at work, movement of the Rt arm, stretching exercises   Pain Relieving Factors ionto, ice breaks, medication            [facility] PT Assessment - 01/12/15 0001    Assessment   Medical Diagnosis M77.01 -Rt golfer's elbow   Onset Date/Surgical Date 06/02/14   Hand Dominance Right   Observation/Other Assessments   Focus on Therapeutic Outcomes (FOTO)  62% limitation   Strength   Overall Strength Comments 43#   pain with this motion                     [facility] Adult PT Treatment/Exercise - 01/12/15 0001    Modalities   Modalities Ultrasound   Ultrasound   Ultrasound Location Rt medial epicondyle   Ultrasound Parameters 20% pulsed, 3 MHz x 8 minutes   Ultrasound Goals Pain   Iontophoresis   Type of Iontophoresis Dexamethasone   Location RT medial elbow   Dose #4 1 ml   Time 6 hour wear   Manual Therapy   Manual Therapy Soft tissue mobilization  using cryostim                PT Education - 01/12/15 1536    Education provided Yes   Education Details ionto    Person(s) Educated Patient   Methods Explanation   Comprehension Verbalized understanding             PT Long Term Goals - 01/12/15 1506    PT LONG TERM GOAL #1   Title Independent with HEP    Time 4   Period Weeks   Status Not Met  Pt not able to do stretching exercises due to pain   PT LONG TERM GOAL #2   Title Increase Rt. grip strength to 50 lbs. 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 elbow pain to 1/10 in order to proper UE exercise program.    Time 4   Period Weeks   Status On-going  8/10 pain with lifting and use of Rt arm               Plan - 01/12/15 1531    Clinical Impression Statement Pt with continued Rt medial elbow pain that limits ability to use Rt UE to lift at work.  Pt with 8/10 pain with work activity.  Pt is not able to tolerate PT exercises including gentle stretching exercises.  Pt will see MD tomorrow to discuss lack of progress.     Pt will benefit from skilled therapeutic intervention in order to improve on the following deficits Pain;Decreased strength;Decreased activity tolerance   Rehab Potential Good   PT Frequency 1x / week   PT Duration 4 weeks   PT Treatment/Interventions Iontophoresis 4mg /ml Dexamethasone;Moist Heat;Ultrasound;Electrical Stimulation;Neuromuscular re-education;Therapeutic exercise;Manual techniques;Passive range of motion;Patient/family  education;Energy conservation   PT Next Visit Plan 2 more ionto patches.  See what MD says.  Gentle flexibility and modalities.    Consulted and Agree with Plan of Care Patient        Problem List Patient Active Problem List   Diagnosis Date Noted  . Lateral epicondylitis of right elbow 04/02/2013    TAKACS,KELLY, PT 01/12/2015, 3:38 PM  [place] [facility] [address] [place], [place], [address] Phone: [phone]   Fax:  [phone]

## 2015-01-12 NOTE — Patient Instructions (Signed)

## 2015-01-18 ENCOUNTER — Ambulatory Visit: Payer: 59 | Attending: Orthopedic Surgery | Admitting: Physical Therapy

## 2015-01-18 ENCOUNTER — Encounter: Payer: Self-pay | Admitting: Physical Therapy

## 2015-01-18 DIAGNOSIS — M25521 Pain in right elbow: Secondary | ICD-10-CM | POA: Insufficient documentation

## 2015-01-18 NOTE — Therapy (Addendum)
[facility] [address] [place], [place], [address] Phone: [phone]   Fax:  [phone]  Physical Therapy Treatment  Patient Details  Name: Samantha Curtis MRN: [id number] Date of Birth: August 08, 1965 Referring Provider:  Kelton Pillar, MD  Encounter Date: 01/18/2015      PT End of Session - 01/18/15 1524    Visit Number 6   Date for PT Re-Evaluation 02/12/15   PT Start Time 3154   PT Stop Time 1525   PT Time Calculation (min) 40 min   Activity Tolerance Patient limited by pain   Behavior During Therapy [facility] for tasks assessed/performed      History reviewed. No pertinent past medical history.  Past Surgical History  Procedure Laterality Date  . Abdominal hysterectomy      There were no vitals filed for this visit.  Visit Diagnosis:  Elbow pain, right      Subjective Assessment - 01/18/15 1517    Subjective MD gave pt a wrist brace to wear to rest the wrist movement. This is helping she reports. Will see MD in September.    Currently in Pain? Yes   Pain Score 3    Pain Location Elbow   Pain Orientation Right;Medial   Pain Descriptors / Indicators Discomfort;Dull   Aggravating Factors  Work duties   Pain Relieving Factors Ionto, brace, ice, [place]   Multiple Pain Sites No                         [facility] Adult PT Treatment/Exercise - 01/18/15 0001    Ultrasound   Ultrasound Location Rt medial elbow   Ultrasound Parameters 50%, 1.2wt/cm 8 min 3MZ   Ultrasound Goals Pain   Iontophoresis   Type of Iontophoresis Dexamethasone   Location RT medial elbow   Dose #5 1 ml   Time 6 hour wear  Skin intact today   Manual Therapy   Manual Therapy --  ice massage to Rt medial elbow with cryostim                     PT Long Term Goals - 01/18/15 1528    PT LONG TERM GOAL #1   Time 4   Period Weeks   Status On-going  Exercises still on hold secondary to pain at elbow.   PT LONG  TERM GOAL #2   Title Increase Rt. grip strength to 50 lbs. 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    PT LONG TERM GOAL #3   Title Will reduce Rt. elbow pain to 1/10 in order to proper UE exercise program.    Time 4   Period Weeks   Status On-going  Progressing towards, this week pain more 3-4/10 level.   PT LONG TERM GOAL #4   Title Verbalize understanding of how to intiate and perform an exercise program for strength    Time 4   Period Weeks   Status On-going  Limited by pain.               Plan - 01/18/15 1526    Clinical Impression Statement Pt saw MD to discuss current level. He wants her wear a brace for her wrist to limit flex/ext which she reports helps.    Pt will benefit from skilled therapeutic intervention in order to  improve on the following deficits Pain;Decreased strength;Decreased activity tolerance   Rehab Potential Good   PT Frequency 1x / week   PT Duration 4 weeks   PT Treatment/Interventions Iontophoresis 4mg /ml Dexamethasone;Moist Heat;Ultrasound;Electrical Stimulation;Neuromuscular re-education;Therapeutic exercise;Manual techniques;Passive range of motion;Patient/family education;Energy conservation   PT Next Visit Plan Ionto, [place], see if pt wants to DC or be put on hold and possibly resume PT later to see if the elbow willl tolerate more exercises.    Consulted and Agree with Plan of Care Patient        Problem List Patient Active Problem List   Diagnosis Date Noted  . Lateral epicondylitis of right elbow 04/02/2013    Jojuan Champney, PTA 01/18/2015, 3:31 PM PHYSICAL THERAPY DISCHARGE SUMMARY  Visits from Start of Care: 6  Current functional level related to goals / functional outcomes: Pt attended 6 PT sessions including iontophoresis. Pt was having continued pain in the Rt elbow that was limiting her use at work.  Pt was going to follow-up with MD to  discuss progress.     Remaining deficits: See above.  Pain with use of Rt UE.     Education / Equipment: HEP Plan: Patient agrees to discharge.  Patient goals were partially met. Patient is being discharged due to lack of progress.  ?????   Sigurd Sos, PT 02/09/2015 3:47 PM  [facility] [address], [place], [place], [address] Phone: [phone]   Fax:  [phone]

## 2015-01-26 ENCOUNTER — Ambulatory Visit: Payer: 59

## 2015-02-02 ENCOUNTER — Encounter: Payer: 59 | Admitting: Physical Therapy

## 2015-04-06 ENCOUNTER — Other Ambulatory Visit: Payer: Self-pay

## 2015-04-06 DIAGNOSIS — Z1231 Encounter for screening mammogram for malignant neoplasm of breast: Secondary | ICD-10-CM

## 2015-05-03 ENCOUNTER — Other Ambulatory Visit: Payer: Self-pay

## 2015-05-03 ENCOUNTER — Ambulatory Visit
Admission: RE | Admit: 2015-05-03 | Discharge: 2015-05-03 | Disposition: A | Discharge status: Home / Self Care | Payer: 59 | Source: Ambulatory Visit

## 2015-05-03 DIAGNOSIS — Z1231 Encounter for screening mammogram for malignant neoplasm of breast: Secondary | ICD-10-CM

## 2015-06-24 MED FILL — predniSONE 20 MG TABS: 20 | 5 days supply | Qty: 10 | Fill #0

## 2015-07-06 MED FILL — OMEPRAZOLE DR 20 MG CAPSULE: 20 | 90 days supply | Qty: 90 | Fill #0

## 2015-09-30 DIAGNOSIS — Z1389 Encounter for screening for other disorder: Secondary | ICD-10-CM | POA: Diagnosis not present

## 2015-09-30 DIAGNOSIS — Z6826 Body mass index (BMI) 26.0-26.9, adult: Secondary | ICD-10-CM | POA: Diagnosis not present

## 2015-09-30 DIAGNOSIS — Z130 Encounter for screening for diseases of the blood and blood-forming organs and certain disorders involving the immune mechanism: Secondary | ICD-10-CM | POA: Diagnosis not present

## 2015-09-30 DIAGNOSIS — Z01419 Encounter for gynecological examination (general) (routine) without abnormal findings: Secondary | ICD-10-CM | POA: Diagnosis not present

## 2016-01-05 DIAGNOSIS — R002 Palpitations: Secondary | ICD-10-CM | POA: Diagnosis not present

## 2016-01-05 MED FILL — METOPROLOL TARTRATE 25 MG T: 25 | 15 days supply | Qty: 30 | Fill #0

## 2016-01-31 MED FILL — METOPROLOL TARTRATE 25 MG T: 25 | 15 days supply | Qty: 30 | Fill #1

## 2016-02-01 ENCOUNTER — Telehealth: Payer: Self-pay | Admitting: *Deleted

## 2016-02-01 NOTE — Telephone Encounter (Signed)
Returning Samantha Curtis's earlier telephone voice mail message regarding scheduling an appointment for VV consultation with Dr. Arbie CookeyEarly.  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  She states she is experiencing bilateral leg pain and painful VV.  Informed her scheduler would call her to set up bilateral venous reflux exam and a VV consultation with Dr. Arbie CookeyEarly.  Samantha Curtis verbalized understanding.

## 2016-02-02 ENCOUNTER — Telehealth: Payer: Self-pay | Admitting: Vascular Surgery

## 2016-02-02 NOTE — Telephone Encounter (Signed)
Sched appt 10/10 lab at 3:00, MD at 3:45. Spoke to pt to inform them of appt. Mailed ppw.

## 2016-02-02 NOTE — Telephone Encounter (Signed)
-----   Message from Micah FlesherSonya D Rankin, RN sent at 02/01/2016  5:30 PM EDT ----- Regarding: scheduling Please schedule Samantha Curtis for a new VV/consultation with Dr. Arbie CookeyEarly and bilateral venous reflux exam. 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  She has bilateral leg pain and painful VV. She requests a late afternoon appointment (2 PM or later) due to her work schedule and short staffing issues at the hospital.  Thanks!

## 2016-02-03 ENCOUNTER — Other Ambulatory Visit: Payer: Self-pay | Admitting: Vascular Surgery

## 2016-02-03 DIAGNOSIS — I83813 Varicose veins of bilateral lower extremities with pain: Secondary | ICD-10-CM

## 2016-02-16 DIAGNOSIS — M25562 Pain in left knee: Secondary | ICD-10-CM | POA: Diagnosis not present

## 2016-02-16 DIAGNOSIS — M25561 Pain in right knee: Secondary | ICD-10-CM | POA: Diagnosis not present

## 2016-02-29 MED FILL — METOPROLOL TARTRATE 25 MG T: 25 | 30 days supply | Qty: 60 | Fill #0

## 2016-03-13 DIAGNOSIS — M25561 Pain in right knee: Secondary | ICD-10-CM | POA: Diagnosis not present

## 2016-03-13 DIAGNOSIS — M170 Bilateral primary osteoarthritis of knee: Secondary | ICD-10-CM | POA: Diagnosis not present

## 2016-03-13 DIAGNOSIS — M25562 Pain in left knee: Secondary | ICD-10-CM | POA: Diagnosis not present

## 2016-03-13 DIAGNOSIS — G8929 Other chronic pain: Secondary | ICD-10-CM | POA: Diagnosis not present

## 2016-03-24 ENCOUNTER — Encounter: Payer: Self-pay | Admitting: Vascular Surgery

## 2016-03-27 ENCOUNTER — Other Ambulatory Visit: Payer: Self-pay | Admitting: Obstetrics and Gynecology

## 2016-03-27 DIAGNOSIS — Z1231 Encounter for screening mammogram for malignant neoplasm of breast: Secondary | ICD-10-CM

## 2016-03-28 ENCOUNTER — Encounter: Payer: Self-pay | Admitting: Vascular Surgery

## 2016-03-28 ENCOUNTER — Ambulatory Visit (HOSPITAL_COMMUNITY)
Admission: RE | Admit: 2016-03-28 | Discharge: 2016-03-28 | Disposition: A | Discharge status: Home / Self Care | Payer: 59 | Source: Ambulatory Visit | Attending: Vascular Surgery | Admitting: Vascular Surgery

## 2016-03-28 ENCOUNTER — Ambulatory Visit (INDEPENDENT_AMBULATORY_CARE_PROVIDER_SITE_OTHER): Payer: 59 | Admitting: Vascular Surgery

## 2016-03-28 VITALS — BP 107/71 | HR 66 | Temp 97.8°F | Resp 18 | Ht 63.0 in | Wt 149.6 lb

## 2016-03-28 DIAGNOSIS — I83893 Varicose veins of bilateral lower extremities with other complications: Secondary | ICD-10-CM

## 2016-03-28 DIAGNOSIS — I83813 Varicose veins of bilateral lower extremities with pain: Secondary | ICD-10-CM | POA: Diagnosis not present

## 2016-03-28 NOTE — Progress Notes (Signed)
Vascular and Vein Specialist of [facility]  Patient name: Samantha Curtis MRN: [id number] DOB: Jul 23, 1965 Sex: female  REASON FOR CONSULT: Evaluate lower extremity venous pathology  HPI: Samantha Curtis is a 50 y.o. female, who is seen today for discussion of bilateral lower extremity venous discomfort. She is status post ablation of her left great saphenous vein at [facility] imaging several years ago. She reports minimal improvement in symptoms following this. 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 She does report progressive large telangiectasia on her right leg in the medial thigh medial calf and popliteal fossa which are painful. 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 Portions she has no history of DVT. She gets minimal symptomatic relief with elevation. She reports this is a burning and stinging sensation and a heavy feeling with prolonged standing.  Past Medical History:  Diagnosis Date  . Leg pain     History reviewed. No pertinent family history.  SOCIAL HISTORY: Social History   Social History  . Marital status: Married    Spouse name: N/A  . Number of children: N/A  . Years of education: N/A   Occupational History  . Not on file.   Social History Main Topics  . Smoking status: Never Smoker  . Smokeless tobacco: Never Used  . Alcohol use No  . Drug use: Unknown  . Sexual activity: Not on file   Other Topics Concern  . Not on file   Social History Narrative  . No narrative on file    Allergies  Allergen Reactions  . Amoxicillin-Pot Clavulanate Itching  . Levofloxacin Nausea Only    Current Outpatient Prescriptions  Medication Sig Dispense Refill  . aspirin 81 MG chewable tablet Chew by mouth daily.    . cetirizine (ZYRTEC) 10 MG tablet Take 10 mg by mouth daily.    . metoprolol tartrate (LOPRESSOR) 25 MG tablet   1    . RANITIDINE HCL PO Take by mouth daily.    Marland Kitchen omeprazole (PRILOSEC) 20 MG capsule 1 capsule     No current facility-administered medications for this visit.     REVIEW OF SYSTEMS:  [X]  denotes positive finding, [ ]  denotes negative finding Cardiac  Comments:  Chest pain or chest pressure:    Shortness of breath upon exertion:    Short of breath when lying flat:    Irregular heart rhythm: x       Vascular    Pain in calf, thigh, or hip brought on by ambulation:    Pain in feet at night that wakes you up from your sleep:     Blood clot in your veins:    Leg swelling:         Pulmonary    Oxygen at home:    Productive cough:     Wheezing:         Neurologic    Sudden weakness in arms or legs:     Sudden numbness in arms or legs:     Sudden onset of difficulty speaking or slurred speech:    Temporary loss of vision in one eye:     Problems with dizziness:         Gastrointestinal    Blood in stool:     Vomited blood:         Genitourinary    Burning when urinating:     Blood in urine:  Psychiatric    Major depression:         Hematologic    Bleeding problems:    Problems with blood clotting too easily:        Skin    Rashes or ulcers:        Constitutional    Fever or chills:      PHYSICAL EXAM: Vitals:   03/28/16 1538  BP: 107/71  Pulse: 66  Resp: 18  Temp: 97.8 F (36.6 C)  TempSrc: Oral  SpO2: 98%  Weight: 149 lb 9.6 oz (67.9 kg)  Height: 5\' 3"  (1.6 m)    GENERAL: The patient is a well-nourished female, in no acute distress. The vital signs are documented above. CARDIOVASCULAR: Plus dorsalis pedis pulses bilaterally PULMONARY: There is good air exchange  ABDOMEN: Soft and non-tender  MUSCULOSKELETAL: There are no major deformities or cyanosis. NEUROLOGIC: No focal weakness or paresthesias are detected. SKIN: There are no ulcers or rashes noted. PSYCHIATRIC: The patient has a normal affect. She does have bilateral reticular veins and  large show pronounced telangiectasia which are raised above the skin. She does have some tenderness over these. DATA:  Venous duplex today reveals reflux throughout her great saphenous vein on the right and no reflux in her small saphenous vein on the right. She does have ablated left great saphenous vein.  MEDICAL ISSUES: Her right leg symptoms do appear to be related to venous pathology. I discussed this at length with patient. I'm somewhat concerned that she had similar treatment to her left leg with the minimal improvement in her symptoms. She has not trialed compression garments for some time. Again I discussed the importance of elevation and compression when possible. This is difficult due to her occupation. She will wear her thigh-high compression garments we will see her again in 3 months for continued discussion. She may have some symptomatic relief with sclerotherapy of these large elevated telangiectasia   Larina Earthlyodd F. Burns Timson, MD FACS [facility] Office Tel [phone] Pager [phone]

## 2016-04-05 DIAGNOSIS — M170 Bilateral primary osteoarthritis of knee: Secondary | ICD-10-CM | POA: Diagnosis not present

## 2016-04-05 DIAGNOSIS — M25561 Pain in right knee: Secondary | ICD-10-CM | POA: Diagnosis not present

## 2016-04-17 MED FILL — METOPROLOL TARTRATE 25 MG T: 25 | 30 days supply | Qty: 60 | Fill #1

## 2016-05-03 ENCOUNTER — Ambulatory Visit
Admission: RE | Admit: 2016-05-03 | Discharge: 2016-05-03 | Disposition: A | Discharge status: Home / Self Care | Payer: 59 | Source: Ambulatory Visit | Attending: Obstetrics and Gynecology | Admitting: Obstetrics and Gynecology

## 2016-05-03 DIAGNOSIS — Z1231 Encounter for screening mammogram for malignant neoplasm of breast: Secondary | ICD-10-CM | POA: Diagnosis not present

## 2016-05-04 DIAGNOSIS — M170 Bilateral primary osteoarthritis of knee: Secondary | ICD-10-CM | POA: Diagnosis not present

## 2016-05-04 DIAGNOSIS — M25561 Pain in right knee: Secondary | ICD-10-CM | POA: Diagnosis not present

## 2016-05-12 MED FILL — METOPROLOL TARTRATE 25 MG T: 25 | 30 days supply | Qty: 60 | Fill #2

## 2016-05-24 DIAGNOSIS — Z6826 Body mass index (BMI) 26.0-26.9, adult: Secondary | ICD-10-CM | POA: Diagnosis not present

## 2016-05-24 DIAGNOSIS — R030 Elevated blood-pressure reading, without diagnosis of hypertension: Secondary | ICD-10-CM | POA: Diagnosis not present

## 2016-05-24 DIAGNOSIS — M170 Bilateral primary osteoarthritis of knee: Secondary | ICD-10-CM | POA: Diagnosis not present

## 2016-05-29 MED FILL — DICLOFENAC SODIUM 1% GEL: 1 | 4 days supply | Qty: 100 | Fill #0

## 2016-05-31 ENCOUNTER — Encounter: Payer: Self-pay | Admitting: Physical Therapy

## 2016-05-31 ENCOUNTER — Ambulatory Visit: Payer: 59 | Attending: Anesthesiology | Admitting: Physical Therapy

## 2016-05-31 ENCOUNTER — Ambulatory Visit: Payer: 59 | Admitting: Physical Therapy

## 2016-05-31 DIAGNOSIS — M25561 Pain in right knee: Secondary | ICD-10-CM | POA: Diagnosis not present

## 2016-05-31 DIAGNOSIS — M25662 Stiffness of left knee, not elsewhere classified: Secondary | ICD-10-CM | POA: Insufficient documentation

## 2016-05-31 DIAGNOSIS — M25661 Stiffness of right knee, not elsewhere classified: Secondary | ICD-10-CM | POA: Insufficient documentation

## 2016-05-31 DIAGNOSIS — G8929 Other chronic pain: Secondary | ICD-10-CM | POA: Diagnosis not present

## 2016-05-31 DIAGNOSIS — M6281 Muscle weakness (generalized): Secondary | ICD-10-CM | POA: Insufficient documentation

## 2016-05-31 DIAGNOSIS — M25562 Pain in left knee: Secondary | ICD-10-CM | POA: Insufficient documentation

## 2016-05-31 NOTE — Therapy (Signed)
[facility] [address], [address] [place], [place], [address] Phone: [phone]   Fax:  [phone]  Physical Therapy Evaluation  Patient Details  Name: Gevena MartRana S Duce MRN: [id number] Date of Birth: [birth year] Referring Provider: Vito BackersAngela M. [facility]  Encounter Date: 05/31/2016      PT End of Session - 05/31/16 1542    Visit Number 1   Date for PT Re-Evaluation 07/26/16   PT Start Time 1445   PT Stop Time 1530   PT Time Calculation (min) 45 min   Activity Tolerance Patient tolerated treatment well   Behavior During Therapy [facility] for tasks assessed/performed      Past Medical History:  Diagnosis Date  . Leg pain     Past Surgical History:  Procedure Laterality Date  . ABDOMINAL HYSTERECTOMY    . ENDOVENOUS ABLATION SAPHENOUS VEIN W/ LASER Left 2005   [facility]  . KNEE ARTHROSCOPY Right   . SHOULDER ARTHROSCOPY Right     There were no vitals filed for this visit.       Subjective Assessment - 05/31/16 1456    Subjective Patient has chronic bilateral knee pain for years.  Patient is had genicular nerve ablation of knees last week, 05/05/2016. It helped several days.  Patient reports her quads are so tight that she is unable to bend her knees. The more she stands then increase in swelling.    Patient Stated Goals reduce pain to return to exercises, bicycle with seat low   Currently in Pain? Yes   Pain Score 10-Worst pain ever  morning is 3/10 bil.    Pain Location Knee   Pain Orientation Left;Right   Pain Descriptors / Indicators Tightness;Pressure   Pain Type Chronic pain   Pain Onset More than a month ago   Pain Frequency Constant   Aggravating Factors  bending knee; standing, stairs and has to hold rails, heavy to bring foot onto pedals with driving   Pain Relieving Factors elevate knees with ice   Effect of Pain on Daily Activities unable to do daily activities   Multiple Pain Sites No             [facility] PT Assessment - 05/31/16 0001      Assessment   Medical Diagnosis M17. 0 osteoarthritis of both knees, unspecified osteo   Referring Provider Vito BackersAngela M. Darket [facility]   Onset Date/Surgical Date 05/05/16   Prior Therapy yes     Precautions   Precautions None     Restrictions   Weight Bearing Restrictions No     Balance Screen   Has the patient fallen in the past 6 months No   Has the patient had a decrease in activity level because of a fear of falling?  No   Is the patient reluctant to leave their home because of a fear of falling?  No     Home Environment   Living Environment Private residence   Type of Home House   Home Layout Multi-level   Alternate Level Stairs-Number of Steps 20     Prior Function   Level of Independence Independent   Vocation Full time employment   Vocation Requirements x-ray tech, standing 9 hours per day     Cognition   Overall Cognitive Status Within Functional Limits for tasks assessed     Observation/Other Assessments   Focus on Therapeutic Outcomes (FOTO)  70% limitation     Observation/Other Assessments-Edema    Edema Circumferential  Circumferential Edema   Circumferential - Right 39 cm   Circumferential - Left  39cm     Posture/Postural Control   Posture/Postural Control No significant limitations     ROM / Strength   AROM / PROM / Strength AROM;PROM;Strength     AROM   Right Knee Extension --  -10 sitting   Right Knee Flexion 75   Left Knee Extension 0  0 sitting   Left Knee Flexion 90     PROM   Right Knee Flexion 95   Left Knee Extension 0   Left Knee Flexion 120     Strength   Right Knee Flexion 3/5   Right Knee Extension 3-/5   Left Knee Flexion 3/5   Left Knee Extension 3/5     Palpation   Patella mobility decreased patella mobility bil.    Palpation comment palpable tenderness located on lateral distal knee where quads are bil.      Ambulation/Gait   Ambulation/Gait Yes   Gait Pattern  Decreased step length - right;Decreased step length - left;Decreased stance time - right;Decreased stance time - left;Decreased hip/knee flexion - right;Decreased hip/knee flexion - left                   [facility] Adult PT Treatment/Exercise - 05/31/16 0001      Modalities   Modalities Iontophoresis;Ultrasound     Ultrasound   Ultrasound Location bil. lateral superior knees    Ultrasound Parameters 8 min each, 1 mhz, 1.2 w/cm2, 100%   Ultrasound Goals Pain     Iontophoresis   Type of Iontophoresis Dexamethasone   Location lateral superior bil. knees   Dose 1 ml  #1   Time 6 hour patch                PT Education - 05/31/16 1520    Education provided Yes   Education Details iontophoresis, heel slides, using the bike.    Person(s) Educated Patient   Methods Explanation;Verbal cues;Handout   Comprehension Returned demonstration          PT Short Term Goals - 05/31/16 1553      PT SHORT TERM GOAL #1   Title independent with initial HEP   Time 4   Period Weeks   Status New     PT SHORT TERM GOAL #2   Title flexion bilateral knees AROM >/= 115 degrees   Time 4   Period Weeks   Status New     PT SHORT TERM GOAL #3   Title swelling by end of day reduced >/= 50% due to improve mobility and decreased pain of bilateral knees   Time 4   Period Weeks   Status New     PT SHORT TERM GOAL #4   Title going up and down steps with >/= 25% greater ease   Time 4   Period Weeks   Status New     PT SHORT TERM GOAL #5   Title pain by end of day for work decreased >/= 25%   Time 4   Period Weeks   Status New           PT Long Term Goals - 05/31/16 1522      PT LONG TERM GOAL #1   Title Independent with HEP    Time 8   Period Weeks   Status New     PT LONG TERM GOAL #2   Title increase bilateral ROM >/= 125 degrees AROM  so she is able to go up and down steps without holding onto the rails   Time 8   Period Weeks   Status New     PT LONG TERM  GOAL #3   Title bilateral knee strength >/= 4+/5 so she is able to stand at work for 9 hour shift with reduction of swelling   Time 8   Period Weeks   Status New     PT LONG TERM GOAL #4   Title bring right leg from gas to brake with reduction of heaviness of leg >/= 75%   Time 8   Period Weeks   Status New     PT LONG TERM GOAL #5   Title be able to squat to pick up item on the floor with pain decreased >/= 50% and increased flexion   Time 8   Period Weeks   Status New     Additional Long Term Goals   Additional Long Term Goals Yes     PT LONG TERM GOAL #6   Title FOTO score </= 44% limitation   Time 8   Period Weeks   Status New               Plan - 05/31/16 1543    Clinical Impression Statement Patient is a 50 year old female with bilateral knee pain.  Patient had genicular nerve ablation on 05/05/2016. Since then she has pain and difficulty with knee ROM.  Patient reports swellling of bilateral knees at the end of a 9 hour work day wiht standing on her feet. Patient has decreased bilateral knee ROM and strength limiting her with steps and squatting.  Patient reports her bilateral knee pain is 5-10/10 especially increases by the end of the day.  Patient has to pull  herself up by the rails when going up and down steps.  Patient has heaviness feeling in right leg when going from gas to brake pedal. Decreased mobility of bilateral patellas and tenderness located on superior lateral knees with a swelling feeling. Patient is of low complexity evaluation with pain evolving and no comorbidities that will impact her care.  Patient will benefit from skilled therapy to improve bilatereal knee ROM and strength while reducing her pain to return to prior funciton.    Rehab Potential Excellent   Clinical Impairments Affecting Rehab Potential None   PT Frequency 2x / week   PT Duration 8 weeks   PT Treatment/Interventions Cryotherapy;Electrical Stimulation;Iontophoresis 4mg /ml  Dexamethasone;Moist Heat;Ultrasound;Gait training;Patient/family education;Neuromuscular re-education;Therapeutic exercise;Therapeutic activities;Manual techniques;Passive range of motion;Dry needling;Energy conservation;Vasopneumatic Device   PT Next Visit Plan knee ROM, soft tissue work to bil. knees, joint mobilization to bil. knees, ultrasound to bil. knees, patella mobilization, ionto #2   PT Home Exercise Plan knee ROM and strength   Recommended Other Services None   Consulted and Agree with Plan of Care Patient      Patient will benefit from skilled therapeutic intervention in order to improve the following deficits and impairments:  Decreased strength, Decreased mobility, Increased edema, Impaired flexibility, Pain, Decreased activity tolerance, Decreased endurance, Increased muscle spasms, Difficulty walking, Decreased range of motion  Visit Diagnosis: Muscle weakness (generalized) - Plan: PT plan of care cert/re-cert  Chronic pain of left knee - Plan: PT plan of care cert/re-cert  Chronic pain of right knee - Plan: PT plan of care cert/re-cert  Stiffness of left knee, not elsewhere classified - Plan: PT plan of care cert/re-cert  Stiffness of right knee, not elsewhere  classified - Plan: PT plan of care cert/re-cert     Problem List Patient Active Problem List   Diagnosis Date Noted  . Lateral epicondylitis of right elbow 04/02/2013    Eulis Foster, PT 05/31/16 3:59 PM   [facility] [address], [address] [place], [place], [address] Phone: [phone]   Fax:  [phone]  Name: TARIA CASTRILLO MRN: [id number] Date of Birth: 1966/02/26

## 2016-05-31 NOTE — Patient Instructions (Addendum)
IONTOPHORESIS PATIENT PRECAUTIONS & CONTRAINDICATIONS:  . Redness under one or both electrodes can occur.  This characterized by a uniform redness that usually disappears within 12 hours of treatment. . Small pinhead size blisters may result in response to the drug.  Contact your physician if the problem persists more than 24 hours. . On rare occasions, iontophoresis therapy can result in temporary skin reactions such as rash, inflammation, irritation or burns.  The skin reactions may be the result of individual sensitivity to the ionic solution used, the condition of the skin at the start of treatment, reaction to the materials in the electrodes, allergies or sensitivity to dexamethasone, or a poor connection between the patch and your skin.  Discontinue using iontophoresis if you have any of these reactions and report to your therapist. . Remove the Patch or electrodes if you have any undue sensation of pain or burning during the treatment and report discomfort to your therapist. . Tell your Therapist if you have had known adverse reactions to the application of electrical current. . If using the Patch, the LED light will turn off when treatment is complete and the patch can be removed.  Approximate treatment time is 1-3 hours.  Remove the patch when light goes off or after 6 hours. . The Patch can be worn during normal activity, however excessive motion where the electrodes have been placed can cause poor contact between the skin and the electrode or uneven electrical current resulting in greater risk of skin irritation. Marland Kitchen. Keep out of the reach of children.   . DO NOT use if you have a cardiac pacemaker or any other electrically sensitive implanted device. . DO NOT use if you have a known sensitivity to dexamethasone. . DO NOT use during Magnetic Resonance Imaging (MRI). . DO NOT use over broken or compromised skin (e.g. sunburn, cuts, or acne) due to the increased risk of skin reaction. . DO  NOT SHAVE over the area to be treated:  To establish good contact between the Patch and the skin, excessive hair may be clipped. . DO NOT place the Patch or electrodes on or over your eyes, directly over your heart, or brain. . DO NOT reuse the Patch or electrodes as this may cause burns to occur.   Self-Mobilization: Heel Slide (Supine)    Slide left heel toward buttocks until a gentle stretch is felt. Hold _5___ seconds. Relax. Repeat _10___ times per set. Do __1__ sets per session. Do _2___ sessions per day.  http://orth.exer.us/710   Copyright  VHI. All rights reserved.  Do the bike for 10 minutes per day. Pedal back and forth if you cannot go all the way around.  [facility] [address], [address] [place], [place] [address] Phone # [phone] Fax [phone]

## 2016-06-01 ENCOUNTER — Ambulatory Visit: Payer: 59 | Admitting: Physical Therapy

## 2016-06-01 DIAGNOSIS — M25661 Stiffness of right knee, not elsewhere classified: Secondary | ICD-10-CM | POA: Diagnosis not present

## 2016-06-01 DIAGNOSIS — M25562 Pain in left knee: Secondary | ICD-10-CM

## 2016-06-01 DIAGNOSIS — M25561 Pain in right knee: Secondary | ICD-10-CM | POA: Diagnosis not present

## 2016-06-01 DIAGNOSIS — M25662 Stiffness of left knee, not elsewhere classified: Secondary | ICD-10-CM

## 2016-06-01 DIAGNOSIS — G8929 Other chronic pain: Secondary | ICD-10-CM | POA: Diagnosis not present

## 2016-06-01 DIAGNOSIS — M6281 Muscle weakness (generalized): Secondary | ICD-10-CM

## 2016-06-01 NOTE — Patient Instructions (Signed)
     Abduction: Clam (Eccentric) - Side-Lying   Lie on side with knees bent with red band around thighs.   Lift top knee, keeping feet together. Keep trunk steady. Slowly lower for 3-5 seconds. _10__ reps per set, _1__ sets per day, _7__ days per week.   Copyright  VHI. All rights reserved.     Elevate legs to help with swelling.   Watch knee over point of shoe alignment.

## 2016-06-01 NOTE — Therapy (Signed)
[facility] [address], [address] [place], [place], [address] Phone: [phone]   Fax:  [phone]  Physical Therapy Treatment  Patient Details  Name: Samantha MartRana S Schmieg MRN: [id number] Date of Birth: [birth year] Referring Provider: Vito BackersAngela M. [facility]  Encounter Date: 06/01/2016      PT End of Session - 06/01/16 1559    Visit Number 2   Date for PT Re-Evaluation 07/26/16   PT Start Time 1528   PT Stop Time 1606   PT Time Calculation (min) 38 min   Activity Tolerance Patient tolerated treatment well      Past Medical History:  Diagnosis Date  . Leg pain     Past Surgical History:  Procedure Laterality Date  . ABDOMINAL HYSTERECTOMY    . ENDOVENOUS ABLATION SAPHENOUS VEIN W/ LASER Left 2005   [facility]  . KNEE ARTHROSCOPY Right   . SHOULDER ARTHROSCOPY Right     There were no vitals filed for this visit.      Subjective Assessment - 06/01/16 1530    Subjective The patch on my knees helped.  Right knee hurts worse than left 4/10.  Bending knee, standing and going up stairs aggravates.  Swelling at night.  I need the railing to support me going up stairs.     Currently in Pain? Yes   Pain Score 4    Pain Location Knee   Pain Orientation Right;Left   Pain Type Chronic pain                         [facility] Adult PT Treatment/Exercise - 06/01/16 0001      Knee/Hip Exercises: Aerobic   Stationary Bike 3 min full revolutions   Nustep L1 8 min legs only     Knee/Hip Exercises: Seated   Other Seated Knee/Hip Exercises knee extension with ankle pump 10x   Other Seated Knee/Hip Exercises quad set presses into the floor 10x     Knee/Hip Exercises: Supine   Bridges Strengthening;Both;1 set;10 reps   Other Supine Knee/Hip Exercises LEs on green ball with ankle pumps, HS sets, knee flexion, ball squeezes, red band clams 10x each     Knee/Hip Exercises: Sidelying   Clams red band 10x right and  left     Iontophoresis   Type of Iontophoresis Dexamethasone   Location lateral superior bil. knees   Dose 1 ml  #2   Time 6 hour patch                PT Education - 06/01/16 1552    Education provided Yes   Education Details clams     Person(s) Educated Patient   Methods Explanation;Handout;Demonstration   Comprehension Verbalized understanding;Returned demonstration          PT Short Term Goals - 06/01/16 1727      PT SHORT TERM GOAL #1   Title independent with initial HEP   Time 4   Period Weeks   Status On-going     PT SHORT TERM GOAL #2   Title flexion bilateral knees AROM >/= 115 degrees   Time 4   Period Weeks   Status On-going     PT SHORT TERM GOAL #3   Title swelling by end of day reduced >/= 50% due to improve mobility and decreased pain of bilateral knees   Time 4   Period Weeks   Status On-going     PT SHORT TERM GOAL #  4   Title going up and down steps with >/= 25% greater ease   Time 4   Period Weeks   Status On-going     PT SHORT TERM GOAL #5   Title pain by end of day for work decreased >/= 25%   Time 4   Period Weeks   Status On-going           PT Long Term Goals - 06/01/16 1727      PT LONG TERM GOAL #1   Title Independent with HEP    Time 8   Period Weeks   Status On-going     PT LONG TERM GOAL #2   Title increase bilateral ROM >/= 125 degrees AROM so she is able to go up and down steps without holding onto the rails   Time 8   Period Weeks   Status On-going     PT LONG TERM GOAL #3   Title bilateral knee strength >/= 4+/5 so she is able to stand at work for 9 hour shift with reduction of swelling   Period Weeks   Status On-going     PT LONG TERM GOAL #4   Title bring right leg from gas to brake with reduction of heaviness of leg >/= 75%   Time 8   Period Weeks   Status On-going     PT LONG TERM GOAL #5   Title be able to squat to pick up item on the floor with pain decreased >/= 50% and increased flexion    Period Weeks   Status On-going     PT LONG TERM GOAL #6   Title FOTO score </= 44% limitation   Time 8   Period Weeks   Status On-going               Plan - 06/01/16 1600    Clinical Impression Statement The presents with right > left lateral knee pain and 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   Verbal cues for patellofemoral alignment.  Able to participate in low level ROM and strength exercises with some initial discomfort but patient reports pain improves with repetition.  Therapist closely monitoring throughout treatment session.     PT Next Visit Plan knee ROM, soft tissue work to bil. knees, joint mobilization to bil. knees, ultrasound to bil. knees as needed, patella mobilization, ionto #3      Patient will benefit from skilled therapeutic intervention in order to improve the following deficits and impairments:     Visit Diagnosis: Muscle weakness (generalized)  Chronic pain of left knee  Chronic pain of right knee  Stiffness of left knee, not elsewhere classified  Stiffness of right knee, not elsewhere classified     Problem List Patient Active Problem List   Diagnosis Date Noted  . Lateral epicondylitis of right elbow 04/02/2013   Samantha SharpsStacy Terren Haberle, PT 06/01/16 5:29 PM Phone: [phone] Fax: [phone]  Vivien PrestoSimpson, Samantha [address] 06/01/2016, 5:29 PM  [facility] [address], [address] [place], [place], [address] Phone: [phone]   Fax:  [phone]  Name: Samantha MartRana S Gerstenberger MRN: [id number] Date of Birth: May 21, 1966

## 2016-06-05 ENCOUNTER — Encounter: Payer: Self-pay | Admitting: Physical Therapy

## 2016-06-05 ENCOUNTER — Ambulatory Visit: Payer: 59 | Admitting: Physical Therapy

## 2016-06-05 DIAGNOSIS — G8929 Other chronic pain: Secondary | ICD-10-CM

## 2016-06-05 DIAGNOSIS — M6281 Muscle weakness (generalized): Secondary | ICD-10-CM | POA: Diagnosis not present

## 2016-06-05 DIAGNOSIS — M25661 Stiffness of right knee, not elsewhere classified: Secondary | ICD-10-CM | POA: Diagnosis not present

## 2016-06-05 DIAGNOSIS — M25562 Pain in left knee: Secondary | ICD-10-CM | POA: Diagnosis not present

## 2016-06-05 DIAGNOSIS — M25561 Pain in right knee: Secondary | ICD-10-CM

## 2016-06-05 DIAGNOSIS — M25662 Stiffness of left knee, not elsewhere classified: Secondary | ICD-10-CM

## 2016-06-05 NOTE — Therapy (Signed)
[facility] [address], [address] [place], [place], [address] Phone: [phone]   Fax:  [phone]  Physical Therapy Treatment  Patient Details  Name: Samantha Curtis MRN: [id number] Date of Birth: 09-05-1965 Referring Provider: Vito BackersAngela M. Darket [facility]  Encounter Date: 06/05/2016      PT End of Session - 06/05/16 1516    Visit Number 3   Date for PT Re-Evaluation 07/26/16   PT Start Time 1430   PT Stop Time 1520   PT Time Calculation (min) 50 min   Activity Tolerance Patient tolerated treatment well   Behavior [facility] for tasks assessed/performed      Past Medical History:  Diagnosis Date  . Leg pain     Past Surgical History:  Procedure Laterality Date  . ABDOMINAL HYSTERECTOMY    . ENDOVENOUS ABLATION SAPHENOUS VEIN W/ LASER Left 2005   [facility]  . KNEE ARTHROSCOPY Right   . SHOULDER ARTHROSCOPY Right     There were no vitals filed for this visit.      Subjective Assessment - 06/05/16 1450    Subjective I felt great until I squatted this AM, then my pain increased. Walking makes it better.    Currently in Pain? --  Rt knee 5/10, LT knee 4/10   Pain Descriptors / Indicators Sore   Aggravating Factors  Squatting   Pain Relieving Factors Walking, meds   Multiple Pain Sites No                         [facility] Adult PT Treatment/Exercise - 06/05/16 0001      Knee/Hip Exercises: Stretches   Active Hamstring Stretch Both;20 seconds   Piriformis Stretch Both;3 reps;20 seconds     Knee/Hip Exercises: Aerobic   Nustep L1 x 10 min  Concurrent review of goals & status     Knee/Hip Exercises: Standing   Walking with Sports Cord 20# 10x forward/backward     Knee/Hip Exercises: Supine   Straight Leg Raises Strengthening;Both;3 sets;10 reps   Other Supine Knee/Hip Exercises Ball squeeze glute cocontraction   5 sec hold 10x     Knee/Hip Exercises: Sidelying   Clams red band  10x right and left  VC for technique/form     Iontophoresis   Type of Iontophoresis Dexamethasone   Location lateral superior bil. knees   Dose 1ml  #3 skin intact   Time 6 hour patch                PT Education - 06/05/16 1516    Education provided Yes   Education Details HEP: hip stretches, hamstring and piriformins bil   Person(s) Educated Patient   Methods Explanation;Demonstration;Verbal cues   Comprehension Verbalized understanding;Returned demonstration          PT Short Term Goals - 06/01/16 1727      PT SHORT TERM GOAL #1   Title independent with initial HEP   Time 4   Period Weeks   Status On-going     PT SHORT TERM GOAL #2   Title flexion bilateral knees AROM >/= 115 degrees   Time 4   Period Weeks   Status On-going     PT SHORT TERM GOAL #3   Title swelling by end of day reduced >/= 50% due to improve mobility and decreased pain of bilateral knees   Time 4   Period Weeks   Status On-going  PT SHORT TERM GOAL #4   Title going up and down steps with >/= 25% greater ease   Time 4   Period Weeks   Status On-going     PT SHORT TERM GOAL #5   Title pain by end of day for work decreased >/= 25%   Time 4   Period Weeks   Status On-going           PT Long Term Goals - 06/01/16 1727      PT LONG TERM GOAL #1   Title Independent with HEP    Time 8   Period Weeks   Status On-going     PT LONG TERM GOAL #2   Title increase bilateral ROM >/= 125 degrees AROM so she is able to go up and down steps without holding onto the rails   Time 8   Period Weeks   Status On-going     PT LONG TERM GOAL #3   Title bilateral knee strength >/= 4+/5 so she is able to stand at work for 9 hour shift with reduction of swelling   Period Weeks   Status On-going     PT LONG TERM GOAL #4   Title bring right leg from gas to brake with reduction of heaviness of leg >/= 75%   Time 8   Period Weeks   Status On-going     PT LONG TERM GOAL #5   Title  be able to squat to pick up item on the floor with pain decreased >/= 50% and increased flexion   Period Weeks   Status On-going     PT LONG TERM GOAL #6   Title FOTO score </= 44% limitation   Time 8   Period Weeks   Status On-going               Plan - 06/05/16 1504    Clinical Impression Statement Pt thinks the pain is lessening overall, patches are helpful. Pt presented today with tight hamstrings and piriformis bilaterally.  This was added to HEP. Pt tolerated weightbearing exercises well today.    Rehab Potential Excellent   Clinical Impairments Affecting Rehab Potential None   PT Frequency 2x / week   PT Duration 8 weeks   PT Treatment/Interventions Cryotherapy;Electrical Stimulation;Iontophoresis 4mg /ml Dexamethasone;Moist Heat;Ultrasound;Gait training;Patient/family education;Neuromuscular re-education;Therapeutic exercise;Therapeutic activities;Manual techniques;Passive range of motion;Dry needling;Energy conservation;Vasopneumatic Device   PT Next Visit Plan Ionto #4, nustep, review hip stretches, quad strength, ultrasound if pain over 4/10.   Consulted and Agree with Plan of Care Patient      Patient will benefit from skilled therapeutic intervention in order to improve the following deficits and impairments:  Decreased strength, Decreased mobility, Increased edema, Impaired flexibility, Pain, Decreased activity tolerance, Decreased endurance, Increased muscle spasms, Difficulty walking, Decreased range of motion  Visit Diagnosis: Muscle weakness (generalized)  Chronic pain of left knee  Chronic pain of right knee  Stiffness of left knee, not elsewhere classified  Stiffness of right knee, not elsewhere classified     Problem List Patient Active Problem List   Diagnosis Date Noted  . Lateral epicondylitis of right elbow 04/02/2013    Kayzen Kendzierski, PTA 06/05/2016, 3:21 PM  [facility] [address], [address] [place], [place], [address] Phone: [phone]   Fax:  [phone]  Name: Samantha Curtis MRN: [id number] Date of Birth: Dec 06, 1965

## 2016-06-06 ENCOUNTER — Encounter: Payer: 59 | Admitting: Physical Therapy

## 2016-06-07 ENCOUNTER — Ambulatory Visit: Payer: 59

## 2016-06-07 DIAGNOSIS — M25662 Stiffness of left knee, not elsewhere classified: Secondary | ICD-10-CM | POA: Diagnosis not present

## 2016-06-07 DIAGNOSIS — G8929 Other chronic pain: Secondary | ICD-10-CM

## 2016-06-07 DIAGNOSIS — M25561 Pain in right knee: Secondary | ICD-10-CM

## 2016-06-07 DIAGNOSIS — M170 Bilateral primary osteoarthritis of knee: Secondary | ICD-10-CM | POA: Diagnosis not present

## 2016-06-07 DIAGNOSIS — M25661 Stiffness of right knee, not elsewhere classified: Secondary | ICD-10-CM | POA: Diagnosis not present

## 2016-06-07 DIAGNOSIS — M25562 Pain in left knee: Secondary | ICD-10-CM

## 2016-06-07 DIAGNOSIS — M6281 Muscle weakness (generalized): Secondary | ICD-10-CM | POA: Diagnosis not present

## 2016-06-07 MED FILL — METOPROLOL TARTRATE 25 MG T: 25 | 90 days supply | Qty: 180 | Fill #0

## 2016-06-07 NOTE — Patient Instructions (Addendum)
Leg Extension (Hamstring)   Sit toward front edge of chair, with leg out straight, heel on floor, toes pointing toward body. Keeping back straight, bend forward at hip.  Hold 20 seconds, perform 3 reps. 2-3 times a day.Hamstring Step 1   Straighten left knee. Keep knee level with other knee or on bolster. Hold _20__ seconds. Relax knee by returning foot to start. Repeat _3__ times.   Hamstring Stretch (Standing)   Standing, place one heel on chair or bench. Use one or both hands on thigh for support. Keeping torso straight, lean forward slowly until a stretch is felt in back of same thigh. Hold _20___ seconds. Perform 3 reps.  2-3 times a day.  Repeat with other leg.  Piriformis Stretch, Sitting    Sit, one ankle on opposite knee, same-side hand on crossed knee. Push down on knee, keeping spine straight. Lean torso forward, with flat back, until tension is felt in hamstrings and gluteals of crossed-leg side. Hold _20__ seconds.  Repeat _3__ times per session. Do _3__ sessions per day.  Copyright  VHI. All rights reserved.  Piriformis Stretch, Supine    Lie supine, folded towel under sacrum, one ankle crossed onto opposite knee. Holding bottom leg behind knee, gently pull legs toward chest and roll toward top-leg side. Feel stretch in hip or pelvic region. Hold __20_ seconds.  Repeat __3_ times per session. Do __3_ sessions per day.  Copyright  VHI. All rights reserved.  IONTOPHORESIS PATIENT PRECAUTIONS & CONTRAINDICATIONS:  . Redness under one or both electrodes can occur.  This characterized by a uniform redness that usually disappears within 12 hours of treatment. . Small pinhead size blisters may result in response to the drug.  Contact your physician if the problem persists more than 24 hours. . On rare occasions, iontophoresis therapy can result in temporary skin reactions such as rash, inflammation, irritation or burns.  The skin reactions may be the result of individual  sensitivity to the ionic solution used, the condition of the skin at the start of treatment, reaction to the materials in the electrodes, allergies or sensitivity to dexamethasone, or a poor connection between the patch and your skin.  Discontinue using iontophoresis if you have any of these reactions and report to your therapist. . Remove the Patch or electrodes if you have any undue sensation of pain or burning during the treatment and report discomfort to your therapist. . Tell your Therapist if you have had known adverse reactions to the application of electrical current. . If using the Patch, the LED light will turn off when treatment is complete and the patch can be removed.  Approximate treatment time is 1-3 hours.  Remove the patch when light goes off or after 6 hours. . The Patch can be worn during normal activity, however excessive motion where the electrodes have been placed can cause poor contact between the skin and the electrode or uneven electrical current resulting in greater risk of skin irritation. Marland Kitchen. Keep out of the reach of children.   . DO NOT use if you have a cardiac pacemaker or any other electrically sensitive implanted device. . DO NOT use if you have a known sensitivity to dexamethasone. . DO NOT use during Magnetic Resonance Imaging (MRI). . DO NOT use over broken or compromised skin (e.g. sunburn, cuts, or acne) due to the increased risk of skin reaction. . DO NOT SHAVE over the area to be treated:  To establish good contact between the Patch and the  skin, excessive hair may be clipped. . DO NOT place the Patch or electrodes on or over your eyes, directly over your heart, or brain. . DO NOT reuse the Patch or electrodes as this may cause burns to occur.  [facility] [address], [address] [place], [place] [address] Phone # [phone] Fax [phone]

## 2016-06-07 NOTE — Therapy (Addendum)
[facility] [address], [place], [place], [address] Phone: [phone]   Fax:  [phone]  Physical Therapy Treatment  Patient Details  Name: Samantha Curtis MRN: [id number] Date of Birth: 1965/08/05 Referring Provider: Daylene Posey [facility]  Encounter Date: 06/07/2016      PT End of Session - 06/07/16 1438    Visit Number 4   Date for PT Re-Evaluation 07/26/16   PT Start Time 1400   PT Stop Time 1440   PT Time Calculation (min) 40 min   Activity Tolerance Patient tolerated treatment well   Behavior During Therapy [facility] for tasks assessed/performed      Past Medical History:  Diagnosis Date  . Leg pain     Past Surgical History:  Procedure Laterality Date  . ABDOMINAL HYSTERECTOMY    . ENDOVENOUS ABLATION SAPHENOUS VEIN W/ LASER Left 2005   [facility]  . KNEE ARTHROSCOPY Right   . SHOULDER ARTHROSCOPY Right     There were no vitals filed for this visit.      Subjective Assessment - 06/07/16 1355    Subjective I've been doing my hip stretches and I am feeling better.     Patient Stated Goals reduce pain to return to exercises, bicycle with seat low   Currently in Pain? No/denies   Aggravating Factors  bending knee, walking   Pain Relieving Factors medication            [facility] PT Assessment - 06/07/16 0001      Assessment   Medical Diagnosis M17. 0 osteoarthritis of both knees, unspecified osteo     AROM   Right Knee Flexion 98   Left Knee Extension 0   Left Knee Flexion 120     Strength   Right Knee Flexion 4+/5   Right Knee Extension 4+/5   Left Knee Flexion 4+/5   Left Knee Extension 4+/5                     [facility] Adult PT Treatment/Exercise - 06/07/16 0001      Knee/Hip Exercises: Stretches   Active Hamstring Stretch Both;20 seconds   Piriformis Stretch Both;3 reps;20 seconds     Knee/Hip Exercises: Aerobic   Nustep L1 x 10 min  Concurrent review of status      Knee/Hip Exercises: Standing   Walking with Sports Cord --     Knee/Hip Exercises: Supine   Straight Leg Raises Strengthening;Both;3 sets;10 reps   Other Supine Knee/Hip Exercises Ball squeeze glute cocontraction   5 sec hold 10x     Knee/Hip Exercises: Sidelying   Clams red band 10x right and left  VC for technique/form     Iontophoresis   Type of Iontophoresis Dexamethasone   Location lateral superior bil. knees   Dose 14m  #4 bil knees skin intact   Time 6 hour patch                PT Education - 06/07/16 1413    Education provided Yes   Education Details hip stretches, ionto info   Person(s) Educated Patient   Methods Explanation;Demonstration;Handout   Comprehension Verbalized understanding;Returned demonstration          PT Short Term Goals - 06/01/16 1727      PT SHORT TERM GOAL #1   Title independent with initial HEP   Time 4   Period Weeks   Status On-going     PT SHORT TERM GOAL #  2   Title flexion bilateral knees AROM >/= 115 degrees   Time 4   Period Weeks   Status On-going     PT SHORT TERM GOAL #3   Title swelling by end of day reduced >/= 50% due to improve mobility and decreased pain of bilateral knees   Time 4   Period Weeks   Status On-going     PT SHORT TERM GOAL #4   Title going up and down steps with >/= 25% greater ease   Time 4   Period Weeks   Status On-going     PT SHORT TERM GOAL #5   Title pain by end of day for work decreased >/= 25%   Time 4   Period Weeks   Status On-going           PT Long Term Goals - 06/01/16 1727      PT LONG TERM GOAL #1   Title Independent with HEP    Time 8   Period Weeks   Status On-going     PT LONG TERM GOAL #2   Title increase bilateral ROM >/= 125 degrees AROM so she is able to go up and down steps without holding onto the rails   Time 8   Period Weeks   Status On-going     PT LONG TERM GOAL #3   Title bilateral knee strength >/= 4+/5 so she is able to stand at  work for 9 hour shift with reduction of swelling   Period Weeks   Status On-going     PT LONG TERM GOAL #4   Title bring right leg from gas to brake with reduction of heaviness of leg >/= 75%   Time 8   Period Weeks   Status On-going     PT LONG TERM GOAL #5   Title be able to squat to pick up item on the floor with pain decreased >/= 50% and increased flexion   Period Weeks   Status On-going     PT LONG TERM GOAL #6   Title FOTO score </= 44% limitation   Time 8   Period Weeks   Status On-going               Plan - 06/07/16 1358    Clinical Impression Statement Pt is working to stretch her hips at home and feels this is helping.  Pt reports that she is feeling better overall and reports 70% overall improvement.  Pt with continued hip stiffness and bil. LE weakness.  Pt will continue to benefit from skilled PT for knee strength, hip flexibility and endurance tasks.  2 more ionto patches remain.     Rehab Potential Excellent   PT Frequency 2x / week   PT Duration 8 weeks   PT Treatment/Interventions Cryotherapy;Electrical Stimulation;Iontophoresis 78m/ml Dexamethasone;Moist Heat;Ultrasound;Gait training;Patient/family education;Neuromuscular re-education;Therapeutic exercise;Therapeutic activities;Manual techniques;Passive range of motion;Dry needling;Energy conservation;Vasopneumatic Device   PT Next Visit Plan Ionto #4, nustep, review hip stretches, quad strength.  Pt going to MD to discuss future PT-see what MD says   Consulted and Agree with Plan of Care Patient;Family member/caregiver   Family Member Consulted Pt's son      Patient will benefit from skilled therapeutic intervention in order to improve the following deficits and impairments:  Decreased strength, Decreased mobility, Increased edema, Impaired flexibility, Pain, Decreased activity tolerance, Decreased endurance, Increased muscle spasms, Difficulty walking, Decreased range of motion  Visit  Diagnosis: Muscle weakness (generalized)  Chronic pain of  left knee  Chronic pain of right knee  Stiffness of left knee, not elsewhere classified  Stiffness of right knee, not elsewhere classified     Problem List Patient Active Problem List   Diagnosis Date Noted  . Lateral epicondylitis of right elbow 04/02/2013     Sigurd Sos, PT 06/07/16 2:41 PM PHYSICAL THERAPY DISCHARGE SUMMARY  Visits from Start of Care: 4  Current functional level related to goals / functional outcomes: Pt attended 4 visits and reported 70% overall improvement since the start of care.  Pt didn't return after visit on 06/07/16 and was going to the MD that day to determine need for future PT.     Remaining deficits: See above for most current status.     Education / Equipment: HEP Plan: Patient agrees to discharge.  Patient goals were partially met. Patient is being discharged due to not returning since the last visit.  ?????        Sigurd Sos, PT 06/27/16 7:31 AM  [facility] [address], [place], [place], [address] Phone: [phone]   Fax:  [phone]  Name: Samantha Curtis MRN: [id number] Date of Birth: 1965-10-17

## 2016-07-04 ENCOUNTER — Ambulatory Visit: Payer: 59 | Admitting: Vascular Surgery

## 2016-07-11 ENCOUNTER — Ambulatory Visit: Payer: 59 | Admitting: Vascular Surgery

## 2016-07-27 DIAGNOSIS — J0100 Acute maxillary sinusitis, unspecified: Secondary | ICD-10-CM | POA: Diagnosis not present

## 2016-07-27 DIAGNOSIS — R6883 Chills (without fever): Secondary | ICD-10-CM | POA: Diagnosis not present

## 2016-07-27 DIAGNOSIS — J9801 Acute bronchospasm: Secondary | ICD-10-CM | POA: Diagnosis not present

## 2016-07-27 MED FILL — DOXYCYCLINE HYCLATE 100 MG: 100 | 5 days supply | Qty: 10 | Fill #0

## 2016-07-27 MED FILL — predniSONE 20 MG TABS: 20 | 7 days supply | Qty: 7 | Fill #0

## 2016-08-14 DIAGNOSIS — J3089 Other allergic rhinitis: Secondary | ICD-10-CM | POA: Diagnosis not present

## 2016-08-14 DIAGNOSIS — J45991 Cough variant asthma: Secondary | ICD-10-CM | POA: Diagnosis not present

## 2016-08-15 ENCOUNTER — Other Ambulatory Visit (HOSPITAL_COMMUNITY): Payer: Self-pay | Admitting: Internal Medicine

## 2016-08-15 ENCOUNTER — Ambulatory Visit (HOSPITAL_COMMUNITY)
Admission: RE | Admit: 2016-08-15 | Discharge: 2016-08-15 | Disposition: A | Discharge status: Home / Self Care | Payer: 59 | Source: Ambulatory Visit | Attending: Internal Medicine | Admitting: Internal Medicine

## 2016-08-15 DIAGNOSIS — J9811 Atelectasis: Secondary | ICD-10-CM | POA: Insufficient documentation

## 2016-08-15 DIAGNOSIS — R05 Cough: Secondary | ICD-10-CM

## 2016-08-15 DIAGNOSIS — R059 Cough, unspecified: Secondary | ICD-10-CM

## 2016-08-15 DIAGNOSIS — J45909 Unspecified asthma, uncomplicated: Secondary | ICD-10-CM

## 2016-08-15 DIAGNOSIS — I517 Cardiomegaly: Secondary | ICD-10-CM | POA: Insufficient documentation

## 2016-08-15 DIAGNOSIS — J45991 Cough variant asthma: Secondary | ICD-10-CM | POA: Insufficient documentation

## 2016-08-18 DIAGNOSIS — R0789 Other chest pain: Secondary | ICD-10-CM | POA: Diagnosis not present

## 2016-08-18 DIAGNOSIS — I493 Ventricular premature depolarization: Secondary | ICD-10-CM | POA: Diagnosis not present

## 2016-08-18 DIAGNOSIS — J209 Acute bronchitis, unspecified: Secondary | ICD-10-CM | POA: Diagnosis not present

## 2016-08-18 DIAGNOSIS — I517 Cardiomegaly: Secondary | ICD-10-CM | POA: Diagnosis not present

## 2016-08-21 ENCOUNTER — Other Ambulatory Visit (HOSPITAL_COMMUNITY): Payer: Self-pay | Admitting: Internal Medicine

## 2016-08-21 DIAGNOSIS — I493 Ventricular premature depolarization: Secondary | ICD-10-CM

## 2016-08-31 ENCOUNTER — Ambulatory Visit (HOSPITAL_COMMUNITY): Payer: 59

## 2016-10-07 DIAGNOSIS — M1712 Unilateral primary osteoarthritis, left knee: Secondary | ICD-10-CM | POA: Diagnosis not present

## 2016-10-07 DIAGNOSIS — M1711 Unilateral primary osteoarthritis, right knee: Secondary | ICD-10-CM | POA: Diagnosis not present

## 2016-10-09 MED FILL — MELOXICAM 15 MG TABLET: 15 | 30 days supply | Qty: 30 | Fill #0

## 2016-11-07 DIAGNOSIS — H524 Presbyopia: Secondary | ICD-10-CM | POA: Diagnosis not present

## 2016-11-22 ENCOUNTER — Encounter: Payer: Self-pay | Admitting: Vascular Surgery

## 2016-11-28 ENCOUNTER — Encounter: Payer: Self-pay | Admitting: Vascular Surgery

## 2016-11-28 ENCOUNTER — Ambulatory Visit (INDEPENDENT_AMBULATORY_CARE_PROVIDER_SITE_OTHER): Payer: 59 | Admitting: Vascular Surgery

## 2016-11-28 VITALS — BP 107/77 | HR 54 | Temp 98.0°F | Resp 16 | Ht 63.0 in | Wt 137.0 lb

## 2016-11-28 DIAGNOSIS — I83893 Varicose veins of bilateral lower extremities with other complications: Secondary | ICD-10-CM

## 2016-11-28 NOTE — Progress Notes (Signed)
[facility]  Patient name: Samantha Curtis MRN: [id number] DOB: 12-Jan-1966 Sex: female  REASON FOR VISIT: Follow-up of bilateral lower from the venous pathology.  HPI: Samantha Curtis is a 51 y.o. female here today for continued follow-up. She had prior ablation of her left great saphenous vein with [facility]. Was seen by myself in October 2017 at which time she was having increased difficulty in her right leg with right leg swelling and pain. 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 He's had no relief with graduated compression garments. She also has degenerative arthritis in both knees and is leaning to have knee replacement in the next 6 months. Portions she is had no episodes of bleeding or DVT  Past Medical History:  Diagnosis Date  . Leg pain     History reviewed. No pertinent family history.  SOCIAL HISTORY: Social History  Substance Use Topics  . Smoking status: Never Smoker  . Smokeless tobacco: Never Used  . Alcohol use No    Allergies  Allergen Reactions  . Amoxicillin-Pot Clavulanate Itching  . Levofloxacin Nausea Only    Current Outpatient Prescriptions  Medication Sig Dispense Refill  . aspirin 81 MG chewable tablet Chew by mouth daily.    . cetirizine (ZYRTEC) 10 MG tablet Take 10 mg by mouth daily.    . metoprolol tartrate (LOPRESSOR) 25 MG tablet   1  . naproxen (NAPROSYN) 500 MG tablet Take 500 mg by mouth 2 (two) times daily with a meal.    . omeprazole (PRILOSEC) 20 MG capsule 1 capsule    . RANITIDINE HCL PO Take by mouth daily.     No current facility-administered medications for this visit.     REVIEW OF SYSTEMS:  [X]  denotes positive finding, [ ]  denotes negative finding Cardiac  Comments:  Chest pain or chest pressure:    Shortness of breath upon exertion:    Short of breath when lying flat:    Irregular heart rhythm:          Vascular    Pain in calf, thigh, or hip brought on by ambulation:    Pain in feet at night that wakes you up from your sleep:     Blood clot in your veins:    Leg swelling:  x         PHYSICAL EXAM: Vitals:   11/28/16 1438  BP: 107/77  Pulse: (!) 54  Resp: 16  Temp: 98 F (36.7 C)  SpO2: 99%  Weight: 137 lb (62.1 kg)  Height: 5\' 3"  (1.6 m)    GENERAL: The patient is a well-nourished female, in no acute distress. The vital signs are documented above. CARDIOVASCULAR: 2+ dorsalis pedis pulses bilaterally. Large telangiectasia over both lower extremity more so on her right calf extending down onto her ankle PULMONARY: There is good air exchange  MUSCULOSKELETAL: There are no major deformities or cyanosis. NEUROLOGIC: No focal weakness or paresthesias are detected. SKIN: There are no ulcers or rashes noted. PSYCHIATRIC: The patient has a normal affect.  DATA:  I reviewed her formal duplex from October 2017 and reimage her veins. She does have distention of her great saphenous vein on the right throughout its course with reflux.  MEDICAL ISSUES: I do feel that she has failed conservative treatment. She would be a good candidate for ablation of her right great saphenous vein. She is having the surgery and will defer this  until after the surgery. She plans on doing this in the very first of the year. We will see her again in February for continued discussion. In the meantime she will continue her graduated compression garments and elevation    Larina Earthly, MD [facility] Tel [phone] Pager [phone]

## 2016-12-08 NOTE — Addendum Note (Signed)
Addended by: Burton ApleyPETTY, Ailene Royal A on: 12/08/2016 02:35 PM   Modules accepted: Orders

## 2017-02-23 DIAGNOSIS — K219 Gastro-esophageal reflux disease without esophagitis: Secondary | ICD-10-CM | POA: Diagnosis not present

## 2017-02-23 DIAGNOSIS — E7800 Pure hypercholesterolemia, unspecified: Secondary | ICD-10-CM | POA: Diagnosis not present

## 2017-02-23 DIAGNOSIS — M170 Bilateral primary osteoarthritis of knee: Secondary | ICD-10-CM | POA: Diagnosis not present

## 2017-02-23 DIAGNOSIS — L659 Nonscarring hair loss, unspecified: Secondary | ICD-10-CM | POA: Diagnosis not present

## 2017-02-23 DIAGNOSIS — I493 Ventricular premature depolarization: Secondary | ICD-10-CM | POA: Diagnosis not present

## 2017-02-23 DIAGNOSIS — Z0000 Encounter for general adult medical examination without abnormal findings: Secondary | ICD-10-CM | POA: Diagnosis not present

## 2017-02-23 DIAGNOSIS — I517 Cardiomegaly: Secondary | ICD-10-CM | POA: Diagnosis not present

## 2017-02-23 MED FILL — METOPROLOL TARTRATE 25 MG T: 25 | 30 days supply | Qty: 30 | Fill #0

## 2017-03-12 DIAGNOSIS — Z1211 Encounter for screening for malignant neoplasm of colon: Secondary | ICD-10-CM | POA: Diagnosis not present

## 2017-03-13 DIAGNOSIS — R300 Dysuria: Secondary | ICD-10-CM | POA: Diagnosis not present

## 2017-03-20 ENCOUNTER — Other Ambulatory Visit: Payer: Self-pay | Admitting: Obstetrics and Gynecology

## 2017-03-20 DIAGNOSIS — Z1231 Encounter for screening mammogram for malignant neoplasm of breast: Secondary | ICD-10-CM

## 2017-03-28 MED FILL — METOPROLOL TARTRATE 25 MG T: 25 | 30 days supply | Qty: 30 | Fill #1

## 2017-04-25 DIAGNOSIS — R195 Other fecal abnormalities: Secondary | ICD-10-CM | POA: Diagnosis not present

## 2017-04-25 DIAGNOSIS — K219 Gastro-esophageal reflux disease without esophagitis: Secondary | ICD-10-CM | POA: Diagnosis not present

## 2017-04-30 DIAGNOSIS — M1711 Unilateral primary osteoarthritis, right knee: Secondary | ICD-10-CM | POA: Diagnosis not present

## 2017-04-30 DIAGNOSIS — M1712 Unilateral primary osteoarthritis, left knee: Secondary | ICD-10-CM | POA: Diagnosis not present

## 2017-04-30 MED FILL — METOPROLOL TARTRATE 25 MG T: 25 | 30 days supply | Qty: 30 | Fill #2

## 2017-05-01 MED FILL — PEG-3350 SOLUTION: 420 | 1 days supply | Qty: 4000 | Fill #0

## 2017-05-07 ENCOUNTER — Ambulatory Visit
Admission: RE | Admit: 2017-05-07 | Discharge: 2017-05-07 | Disposition: A | Discharge status: Home / Self Care | Payer: 59 | Source: Ambulatory Visit | Attending: Obstetrics and Gynecology | Admitting: Obstetrics and Gynecology

## 2017-05-07 DIAGNOSIS — Z1231 Encounter for screening mammogram for malignant neoplasm of breast: Secondary | ICD-10-CM

## 2017-05-31 MED FILL — METOPROLOL TARTRATE 25 MG T: 25 | 30 days supply | Qty: 30 | Fill #3

## 2017-06-01 DIAGNOSIS — Z1211 Encounter for screening for malignant neoplasm of colon: Secondary | ICD-10-CM | POA: Diagnosis not present

## 2017-06-01 MED FILL — raNITIdine HCL 150 MG TABS: 150 | 30 days supply | Qty: 60 | Fill #0

## 2017-06-20 ENCOUNTER — Other Ambulatory Visit: Payer: Self-pay | Admitting: Orthopaedic Surgery

## 2017-06-22 MED FILL — METOPROLOL TARTRATE 25 MG T: 25 | 90 days supply | Qty: 90 | Fill #4

## 2017-06-23 MED FILL — raNITIdine HCL 150 MG TABS: 150 | 90 days supply | Qty: 180 | Fill #1

## 2017-06-26 ENCOUNTER — Other Ambulatory Visit: Payer: Self-pay

## 2017-06-26 ENCOUNTER — Encounter (HOSPITAL_COMMUNITY)
Admission: RE | Admit: 2017-06-26 | Discharge: 2017-06-26 | Disposition: A | Discharge status: Home / Self Care | Payer: No Typology Code available for payment source | Source: Ambulatory Visit | Attending: Orthopaedic Surgery | Admitting: Orthopaedic Surgery

## 2017-06-26 ENCOUNTER — Encounter (HOSPITAL_COMMUNITY): Payer: Self-pay

## 2017-06-26 DIAGNOSIS — Z01818 Encounter for other preprocedural examination: Secondary | ICD-10-CM | POA: Insufficient documentation

## 2017-06-26 DIAGNOSIS — Z01810 Encounter for preprocedural cardiovascular examination: Secondary | ICD-10-CM | POA: Insufficient documentation

## 2017-06-26 HISTORY — DX: Cardiac arrhythmia, unspecified: I49.9

## 2017-06-26 HISTORY — DX: Gastro-esophageal reflux disease without esophagitis: K21.9

## 2017-06-26 HISTORY — DX: Unspecified osteoarthritis, unspecified site: M19.90

## 2017-06-26 LAB — BASIC METABOLIC PANEL
ANION GAP: 7 (ref 5–15)
BUN: 6 mg/dL (ref 6–20)
CO2: 28 mmol/L (ref 22–32)
Calcium: 9.4 mg/dL (ref 8.9–10.3)
Chloride: 100 mmol/L — ABNORMAL LOW (ref 101–111)
Creatinine, Ser: 0.74 mg/dL (ref 0.44–1.00)
GFR calc Af Amer: >60 mL/min (ref >60)
GLUCOSE: 87 mg/dL (ref 65–99)
Potassium: 4.5 mmol/L (ref 3.5–5.1)
SODIUM: 135 mmol/L (ref 135–145)

## 2017-06-26 LAB — URINALYSIS, ROUTINE W REFLEX MICROSCOPIC
Bilirubin Urine: NEGATIVE
GLUCOSE, UA: NEGATIVE mg/dL
HGB URINE DIPSTICK: NEGATIVE
KETONES UR: NEGATIVE mg/dL
LEUKOCYTES UA: NEGATIVE
Nitrite: NEGATIVE
PROTEIN: NEGATIVE mg/dL
Specific Gravity, Urine: 1.012 (ref 1.005–1.030)
pH: 5 (ref 5.0–8.0)

## 2017-06-26 LAB — CBC WITH DIFFERENTIAL/PLATELET
BASOS ABS: 0 10*3/uL (ref 0.0–0.1)
Basophils Relative: 0 %
Eosinophils Absolute: 0.1 10*3/uL (ref 0.0–0.7)
Eosinophils Relative: 1 %
HEMATOCRIT: 41.7 % (ref 36.0–46.0)
Hemoglobin: 13.2 g/dL (ref 12.0–15.0)
Lymphocytes Relative: 35 %
Lymphs Abs: 2 10*3/uL (ref 0.7–4.0)
MCH: 30.4 pg (ref 26.0–34.0)
MCHC: 31.7 g/dL (ref 30.0–36.0)
MCV: 96.1 fL (ref 78.0–100.0)
MONO ABS: 0.3 10*3/uL (ref 0.1–1.0)
Monocytes Relative: 6 %
NEUTROS ABS: 3.3 10*3/uL (ref 1.7–7.7)
Neutrophils Relative %: 58 %
Platelets: 220 10*3/uL (ref 150–400)
RBC: 4.34 MIL/uL (ref 3.87–5.11)
RDW: 12.7 % (ref 11.5–15.5)
WBC: 5.7 10*3/uL (ref 4.0–10.5)

## 2017-06-26 LAB — PROTIME-INR
INR: 1.02
PROTHROMBIN TIME: 13.3 s (ref 11.4–15.2)

## 2017-06-26 LAB — SURGICAL PCR SCREEN
MRSA, PCR: NEGATIVE
Staphylococcus aureus: NEGATIVE

## 2017-06-26 LAB — APTT: APTT: 26 s (ref 24–36)

## 2017-06-26 LAB — PREPARE RBC (CROSSMATCH)

## 2017-06-26 NOTE — Pre-Procedure Instructions (Signed)
Samantha Curtis  06/26/2017      [facility] [place] - [address]  [place] [address] Phone: [phone] Fax: [phone]    Your procedure is scheduled on Tuesday 07/03/17  [facility] at 530 A.M.  Call this number if you have problems the morning of surgery:  [phone]   Remember:  Do not eat food or drink liquids after midnight.  Take these medicines the morning of surgery with A SIP OF WATER - METOPROLOL (LOPRESSOR), RANITIDINE (ZANTAC)  7 days prior to surgery STOP taking any Aspirin(unless otherwise instructed by your surgeon), Aleve, Naproxen, Ibuprofen, Motrin, Advil, Goody's, BC's, all herbal medications, fish oil, and all vitamins   Do not wear jewelry, make-up or nail polish.  Do not wear lotions, powders, or perfumes, or deodorant.  Do not shave 48 hours prior to surgery.  Men may shave face and neck.  Do not bring valuables to the hospital.  [facility] is not responsible for any belongings or valuables.  Contacts, dentures or bridgework may not be worn into surgery.  Leave your suitcase in the car.  After surgery it may be brought to your room.  For patients admitted to the hospital, discharge time will be determined by your treatment team.  Patients discharged the day of surgery will not be allowed to drive home.   Name and phone number of your driver:    Special instructions:   - Preparing for Surgery  Before surgery, you can play an important role.  Because skin is not sterile, your skin needs to be as free of germs as possible.  You can reduce the number of germs on you skin by washing with CHG (chlorahexidine gluconate) soap before surgery.  CHG is an antiseptic cleaner which kills germs and bonds with the skin to continue killing germs even after washing.  Please DO NOT use if you have an allergy to CHG or antibacterial soaps.  If your skin becomes  reddened/irritated stop using the CHG and inform your nurse when you arrive at Short Stay.  Do not shave (including legs and underarms) for at least 48 hours prior to the first CHG shower.  You may shave your face.  Please follow these instructions carefully:   1.  Shower with CHG Soap the night before surgery and the                                morning of Surgery.  2.  If you choose to wash your hair, wash your hair first as usual with your       normal shampoo.  3.  After you shampoo, rinse your hair and body thoroughly to remove the                      Shampoo.  4.  Use CHG as you would any other liquid soap.  You can apply chg directly       to the skin and wash gently with scrungie or a clean washcloth.  5.  Apply the CHG Soap to your body ONLY FROM THE NECK DOWN.        Do not use on open wounds or open sores.  Avoid contact with your eyes,       ears, mouth and genitals (private parts).  Wash genitals (private parts)  with your normal soap.  6.  Wash thoroughly, paying special attention to the area where your surgery        will be performed.  7.  Thoroughly rinse your body with warm water from the neck down.  8.  DO NOT shower/wash with your normal soap after using and rinsing off       the CHG Soap.  9.  Pat yourself dry with a clean towel.            10.  Wear clean pajamas.            11.  Place clean sheets on your bed the night of your first shower and do not        sleep with pets.  Day of Surgery  Do not apply any lotions/deoderants the morning of surgery.  Please wear clean clothes to the hospital/surgery center.    Please read over the following fact sheets that you were given. MRSA Information and Surgical Site Infection Prevention

## 2017-06-27 LAB — ABO/RH: ABO/RH(D): AB POS

## 2017-07-02 ENCOUNTER — Other Ambulatory Visit: Payer: Self-pay | Admitting: Orthopaedic Surgery

## 2017-07-02 MED ORDER — LACTATED RINGERS IV SOLN
INTRAVENOUS | Status: DC
  Administered 2017-07-03 (×2): via INTRAVENOUS

## 2017-07-02 MED ORDER — VANCOMYCIN HCL IN DEXTROSE 1-5 GM/200ML-% IV SOLN
1000.0000 mg | INTRAVENOUS | Status: AC
  Administered 2017-07-03: 1000 mg via INTRAVENOUS
  Filled 2017-07-02 (×2): qty 200

## 2017-07-03 ENCOUNTER — Encounter (HOSPITAL_COMMUNITY): Payer: Self-pay | Admitting: *Deleted

## 2017-07-03 ENCOUNTER — Inpatient Hospital Stay (HOSPITAL_COMMUNITY): Payer: No Typology Code available for payment source | Admitting: Anesthesiology

## 2017-07-03 ENCOUNTER — Inpatient Hospital Stay (HOSPITAL_COMMUNITY)
Admission: RE | Admit: 2017-07-03 | Discharge: 2017-07-08 | DRG: 462 | Disposition: A | Discharge status: Home Health | Payer: No Typology Code available for payment source | Source: Ambulatory Visit | Attending: Orthopaedic Surgery | Admitting: Orthopaedic Surgery

## 2017-07-03 ENCOUNTER — Encounter (HOSPITAL_COMMUNITY)
Admission: RE | Disposition: A | Discharge status: Home Health | Payer: Self-pay | Source: Ambulatory Visit | Attending: Orthopaedic Surgery

## 2017-07-03 DIAGNOSIS — Z91040 Latex allergy status: Secondary | ICD-10-CM | POA: Diagnosis not present

## 2017-07-03 DIAGNOSIS — K219 Gastro-esophageal reflux disease without esophagitis: Secondary | ICD-10-CM | POA: Diagnosis not present

## 2017-07-03 DIAGNOSIS — Z90710 Acquired absence of both cervix and uterus: Secondary | ICD-10-CM | POA: Diagnosis not present

## 2017-07-03 DIAGNOSIS — G8918 Other acute postprocedural pain: Secondary | ICD-10-CM | POA: Diagnosis not present

## 2017-07-03 DIAGNOSIS — Z881 Allergy status to other antibiotic agents status: Secondary | ICD-10-CM | POA: Diagnosis not present

## 2017-07-03 DIAGNOSIS — Z79899 Other long term (current) drug therapy: Secondary | ICD-10-CM

## 2017-07-03 DIAGNOSIS — Z96653 Presence of artificial knee joint, bilateral: Secondary | ICD-10-CM | POA: Diagnosis not present

## 2017-07-03 DIAGNOSIS — R030 Elevated blood-pressure reading, without diagnosis of hypertension: Secondary | ICD-10-CM | POA: Diagnosis not present

## 2017-07-03 DIAGNOSIS — M170 Bilateral primary osteoarthritis of knee: Principal | ICD-10-CM | POA: Diagnosis present

## 2017-07-03 DIAGNOSIS — Z880 Allergy status to penicillin: Secondary | ICD-10-CM | POA: Diagnosis not present

## 2017-07-03 HISTORY — PX: TOTAL KNEE ARTHROPLASTY: SHX125

## 2017-07-03 HISTORY — DX: Pure hypercholesterolemia, unspecified: E78.00

## 2017-07-03 SURGERY — ARTHROPLASTY, KNEE, BILATERAL, TOTAL
Anesthesia: Epidural | Site: Knee | Laterality: Bilateral

## 2017-07-03 MED ORDER — FENTANYL CITRATE (PF) 250 MCG/5ML IJ SOLN
INTRAMUSCULAR | Status: AC
  Filled 2017-07-03: qty 5

## 2017-07-03 MED ORDER — PROMETHAZINE HCL 25 MG/ML IJ SOLN
INTRAMUSCULAR | Status: AC
  Filled 2017-07-03: qty 1

## 2017-07-03 MED ORDER — SUCCINYLCHOLINE CHLORIDE 200 MG/10ML IV SOSY
PREFILLED_SYRINGE | INTRAVENOUS | Status: AC
  Filled 2017-07-03: qty 10

## 2017-07-03 MED ORDER — DEXAMETHASONE SODIUM PHOSPHATE 10 MG/ML IJ SOLN
INTRAMUSCULAR | Status: AC
  Filled 2017-07-03: qty 1

## 2017-07-03 MED ORDER — DEXTROSE 5 % IV SOLN
INTRAVENOUS | Status: DC | PRN
  Administered 2017-07-03: 20 ug/min via INTRAVENOUS

## 2017-07-03 MED ORDER — ONDANSETRON HCL 4 MG/2ML IJ SOLN
INTRAMUSCULAR | Status: DC | PRN
  Administered 2017-07-03: 4 mg via INTRAVENOUS

## 2017-07-03 MED ORDER — METOPROLOL TARTRATE 12.5 MG HALF TABLET: 25.0000 mg | ORAL_TABLET | Freq: Once | ORAL | Status: DC

## 2017-07-03 MED ORDER — METOCLOPRAMIDE HCL 5 MG PO TABS: 5.0000 mg | ORAL_TABLET | Freq: Three times a day (TID) | ORAL | Status: DC | PRN

## 2017-07-03 MED ORDER — PROMETHAZINE HCL 25 MG/ML IJ SOLN
6.2500 mg | Freq: Four times a day (QID) | INTRAMUSCULAR | Status: DC | PRN
  Administered 2017-07-03: 6.25 mg via INTRAVENOUS

## 2017-07-03 MED ORDER — BUPIVACAINE HCL (PF) 0.5 % IJ SOLN
INTRAMUSCULAR | Status: DC | PRN
  Administered 2017-07-03 (×3): 5 mL

## 2017-07-03 MED ORDER — METOPROLOL TARTRATE 25 MG PO TABS
25.0000 mg | ORAL_TABLET | Freq: Two times a day (BID) | ORAL | Status: DC
  Administered 2017-07-04 – 2017-07-08 (×8): 25 mg via ORAL
  Filled 2017-07-03 (×9): qty 1

## 2017-07-03 MED ORDER — MENTHOL 3 MG MT LOZG: 1.0000 | LOZENGE | OROMUCOSAL | Status: DC | PRN

## 2017-07-03 MED ORDER — METHOCARBAMOL 500 MG PO TABS
500.0000 mg | ORAL_TABLET | Freq: Four times a day (QID) | ORAL | Status: DC | PRN
  Administered 2017-07-03 – 2017-07-04 (×2): 500 mg via ORAL
  Filled 2017-07-03 (×2): qty 1

## 2017-07-03 MED ORDER — BUPIVACAINE-EPINEPHRINE (PF) 0.5% -1:200000 IJ SOLN
INTRAMUSCULAR | Status: DC | PRN
  Administered 2017-07-03: 30 mL

## 2017-07-03 MED ORDER — LORATADINE 10 MG PO TABS
10.0000 mg | ORAL_TABLET | Freq: Every day | ORAL | Status: DC
  Administered 2017-07-04 – 2017-07-08 (×5): 10 mg via ORAL
  Filled 2017-07-03 (×5): qty 1

## 2017-07-03 MED ORDER — OXYCODONE HCL 5 MG PO TABS: 5.0000 mg | ORAL_TABLET | Freq: Once | ORAL | Status: DC | PRN

## 2017-07-03 MED ORDER — LIDOCAINE 2% (20 MG/ML) 5 ML SYRINGE
INTRAMUSCULAR | Status: AC
  Filled 2017-07-03: qty 5

## 2017-07-03 MED ORDER — METOCLOPRAMIDE HCL 5 MG/ML IJ SOLN: 5.0000 mg | Freq: Three times a day (TID) | INTRAMUSCULAR | Status: DC | PRN

## 2017-07-03 MED ORDER — OXYCODONE HCL 5 MG PO TABS
10.0000 mg | ORAL_TABLET | ORAL | Status: DC | PRN
  Administered 2017-07-03 – 2017-07-08 (×21): 10 mg via ORAL
  Filled 2017-07-03 (×22): qty 2

## 2017-07-03 MED ORDER — TRANEXAMIC ACID 1000 MG/10ML IV SOLN
2000.0000 mg | INTRAVENOUS | Status: AC
  Administered 2017-07-03: 2000 mg via TOPICAL
  Filled 2017-07-03: qty 20

## 2017-07-03 MED ORDER — BISACODYL 5 MG PO TBEC
5.0000 mg | DELAYED_RELEASE_TABLET | Freq: Every day | ORAL | Status: DC | PRN
  Administered 2017-07-05 – 2017-07-06 (×2): 5 mg via ORAL
  Filled 2017-07-03 (×3): qty 1

## 2017-07-03 MED ORDER — BUPIVACAINE LIPOSOME 1.3 % IJ SUSP
20.0000 mL | INTRAMUSCULAR | Status: AC
  Administered 2017-07-03: 20 mL
  Filled 2017-07-03: qty 20

## 2017-07-03 MED ORDER — ACETAMINOPHEN 650 MG RE SUPP
650.0000 mg | RECTAL | Status: DC | PRN
  Administered 2017-07-08: 650 mg via RECTAL

## 2017-07-03 MED ORDER — FENTANYL CITRATE (PF) 100 MCG/2ML IJ SOLN
INTRAMUSCULAR | Status: DC | PRN
  Administered 2017-07-03 (×3): 50 ug via INTRAVENOUS

## 2017-07-03 MED ORDER — METHOCARBAMOL 1000 MG/10ML IJ SOLN: 500.0000 mg | Freq: Four times a day (QID) | INTRAVENOUS | Status: DC | PRN

## 2017-07-03 MED ORDER — PHENOL 1.4 % MT LIQD: 1.0000 | OROMUCOSAL | Status: DC | PRN

## 2017-07-03 MED ORDER — TRANEXAMIC ACID 1000 MG/10ML IV SOLN
1000.0000 mg | Freq: Once | INTRAVENOUS | Status: AC
  Administered 2017-07-03: 1000 mg via INTRAVENOUS
  Filled 2017-07-03: qty 10

## 2017-07-03 MED ORDER — FENTANYL CITRATE (PF) 100 MCG/2ML IJ SOLN: 25.0000 ug | INTRAMUSCULAR | Status: DC | PRN

## 2017-07-03 MED ORDER — DIPHENHYDRAMINE HCL 12.5 MG/5ML PO ELIX
12.5000 mg | ORAL_SOLUTION | ORAL | Status: DC | PRN
  Administered 2017-07-03 – 2017-07-05 (×4): 25 mg via ORAL
  Filled 2017-07-03 (×4): qty 10

## 2017-07-03 MED ORDER — ROCURONIUM BROMIDE 10 MG/ML (PF) SYRINGE
PREFILLED_SYRINGE | INTRAVENOUS | Status: AC
  Filled 2017-07-03: qty 5

## 2017-07-03 MED ORDER — ONDANSETRON HCL 4 MG/2ML IJ SOLN
4.0000 mg | Freq: Once | INTRAMUSCULAR | Status: AC | PRN
  Administered 2017-07-03: 4 mg via INTRAVENOUS

## 2017-07-03 MED ORDER — PROPOFOL 10 MG/ML IV BOLUS
INTRAVENOUS | Status: AC
  Filled 2017-07-03: qty 20

## 2017-07-03 MED ORDER — ROPIVACAINE HCL 2 MG/ML IJ SOLN
8.0000 mL/h | INTRAMUSCULAR | Status: AC
  Administered 2017-07-03: 8 mL/h via EPIDURAL
  Filled 2017-07-03: qty 200

## 2017-07-03 MED ORDER — EPHEDRINE 5 MG/ML INJ
INTRAVENOUS | Status: AC
  Filled 2017-07-03: qty 10

## 2017-07-03 MED ORDER — ONDANSETRON HCL 4 MG/2ML IJ SOLN
INTRAMUSCULAR | Status: AC
  Administered 2017-07-03: 4 mg via INTRAVENOUS
  Filled 2017-07-03: qty 2

## 2017-07-03 MED ORDER — PROPOFOL 1000 MG/100ML IV EMUL
INTRAVENOUS | Status: AC
  Filled 2017-07-03: qty 100

## 2017-07-03 MED ORDER — HYDROMORPHONE HCL 1 MG/ML IJ SOLN
0.5000 mg | INTRAMUSCULAR | Status: DC | PRN
  Administered 2017-07-03: 0.5 mg via INTRAVENOUS
  Administered 2017-07-04 – 2017-07-05 (×9): 1 mg via INTRAVENOUS
  Administered 2017-07-05: 0.5 mg via INTRAVENOUS
  Administered 2017-07-06 – 2017-07-08 (×9): 1 mg via INTRAVENOUS
  Filled 2017-07-03 (×21): qty 1

## 2017-07-03 MED ORDER — SODIUM CHLORIDE 0.9 % IR SOLN
Status: DC | PRN
  Administered 2017-07-03: 3000 mL

## 2017-07-03 MED ORDER — BUPIVACAINE HCL (PF) 0.5 % IJ SOLN
INTRAMUSCULAR | Status: AC
  Filled 2017-07-03: qty 60

## 2017-07-03 MED ORDER — ONDANSETRON HCL 4 MG/2ML IJ SOLN: 4.0000 mg | Freq: Once | INTRAMUSCULAR | Status: DC | PRN

## 2017-07-03 MED ORDER — GLYCOPYRROLATE 0.2 MG/ML IJ SOLN
INTRAMUSCULAR | Status: DC | PRN
  Administered 2017-07-03: .1 mg via INTRAVENOUS
  Administered 2017-07-03: 0.2 mg via INTRAVENOUS
  Administered 2017-07-03: 0.1 mg via INTRAVENOUS

## 2017-07-03 MED ORDER — MIDAZOLAM HCL 5 MG/5ML IJ SOLN
INTRAMUSCULAR | Status: DC | PRN
  Administered 2017-07-03: 2 mg via INTRAVENOUS

## 2017-07-03 MED ORDER — FAMOTIDINE 20 MG PO TABS
20.0000 mg | ORAL_TABLET | Freq: Every day | ORAL | Status: DC
  Administered 2017-07-04 – 2017-07-08 (×5): 20 mg via ORAL
  Filled 2017-07-03 (×5): qty 1

## 2017-07-03 MED ORDER — PROPOFOL 500 MG/50ML IV EMUL
INTRAVENOUS | Status: DC | PRN
  Administered 2017-07-03: 75 ug/kg/min via INTRAVENOUS

## 2017-07-03 MED ORDER — LIDOCAINE HCL (CARDIAC) 20 MG/ML IV SOLN
INTRAVENOUS | Status: DC | PRN
  Administered 2017-07-03: 60 mg via INTRAVENOUS

## 2017-07-03 MED ORDER — DOCUSATE SODIUM 100 MG PO CAPS
100.0000 mg | ORAL_CAPSULE | Freq: Two times a day (BID) | ORAL | Status: DC
  Administered 2017-07-03 – 2017-07-08 (×10): 100 mg via ORAL
  Filled 2017-07-03 (×10): qty 1

## 2017-07-03 MED ORDER — DEXAMETHASONE SODIUM PHOSPHATE 10 MG/ML IJ SOLN
INTRAMUSCULAR | Status: DC | PRN
  Administered 2017-07-03: 10 mg via INTRAVENOUS

## 2017-07-03 MED ORDER — 0.9 % SODIUM CHLORIDE (POUR BTL) OPTIME
TOPICAL | Status: DC | PRN
  Administered 2017-07-03: 1000 mL

## 2017-07-03 MED ORDER — ACETAMINOPHEN 325 MG PO TABS
650.0000 mg | ORAL_TABLET | ORAL | Status: DC | PRN
  Administered 2017-07-04 – 2017-07-07 (×5): 650 mg via ORAL
  Filled 2017-07-03 (×6): qty 2

## 2017-07-03 MED ORDER — PHENYLEPHRINE 40 MCG/ML (10ML) SYRINGE FOR IV PUSH (FOR BLOOD PRESSURE SUPPORT)
PREFILLED_SYRINGE | INTRAVENOUS | Status: AC
  Filled 2017-07-03: qty 10

## 2017-07-03 MED ORDER — OXYCODONE HCL 5 MG/5ML PO SOLN: 5.0000 mg | Freq: Once | ORAL | Status: DC | PRN

## 2017-07-03 MED ORDER — ASPIRIN EC 325 MG PO TBEC
325.0000 mg | DELAYED_RELEASE_TABLET | Freq: Two times a day (BID) | ORAL | Status: DC
  Administered 2017-07-04 – 2017-07-08 (×9): 325 mg via ORAL
  Filled 2017-07-03 (×9): qty 1

## 2017-07-03 MED ORDER — ACETAMINOPHEN 10 MG/ML IV SOLN
INTRAVENOUS | Status: AC
  Administered 2017-07-03: 1000 mg via INTRAVENOUS
  Filled 2017-07-03: qty 100

## 2017-07-03 MED ORDER — BUPIVACAINE IN DEXTROSE 0.75-8.25 % IT SOLN
INTRATHECAL | Status: DC | PRN
  Administered 2017-07-03: 2 mL via INTRATHECAL

## 2017-07-03 MED ORDER — SODIUM CHLORIDE 0.9 % IJ SOLN
INTRAMUSCULAR | Status: DC | PRN
  Administered 2017-07-03: 50 mL

## 2017-07-03 MED ORDER — ONDANSETRON HCL 4 MG PO TABS: 4.0000 mg | ORAL_TABLET | Freq: Four times a day (QID) | ORAL | Status: DC | PRN

## 2017-07-03 MED ORDER — ALUM & MAG HYDROXIDE-SIMETH 200-200-20 MG/5ML PO SUSP: 30.0000 mL | ORAL | Status: DC | PRN

## 2017-07-03 MED ORDER — METOPROLOL TARTRATE 12.5 MG HALF TABLET
ORAL_TABLET | ORAL | Status: AC
  Administered 2017-07-03: 25 mg via ORAL
  Filled 2017-07-03: qty 2

## 2017-07-03 MED ORDER — BUPIVACAINE-EPINEPHRINE (PF) 0.5% -1:200000 IJ SOLN
INTRAMUSCULAR | Status: AC
  Filled 2017-07-03: qty 30

## 2017-07-03 MED ORDER — MIDAZOLAM HCL 2 MG/2ML IJ SOLN
INTRAMUSCULAR | Status: AC
  Filled 2017-07-03: qty 2

## 2017-07-03 MED ORDER — TRANEXAMIC ACID 1000 MG/10ML IV SOLN
1000.0000 mg | INTRAVENOUS | Status: AC
  Administered 2017-07-03: 1000 mg via INTRAVENOUS
  Filled 2017-07-03: qty 1100

## 2017-07-03 MED ORDER — ONDANSETRON HCL 4 MG/2ML IJ SOLN
INTRAMUSCULAR | Status: AC
  Filled 2017-07-03: qty 2

## 2017-07-03 MED ORDER — ACETAMINOPHEN 10 MG/ML IV SOLN
1000.0000 mg | Freq: Once | INTRAVENOUS | Status: AC
  Administered 2017-07-03: 1000 mg via INTRAVENOUS

## 2017-07-03 MED ORDER — VANCOMYCIN HCL IN DEXTROSE 1-5 GM/200ML-% IV SOLN
1000.0000 mg | Freq: Two times a day (BID) | INTRAVENOUS | Status: AC
  Administered 2017-07-03: 1000 mg via INTRAVENOUS
  Filled 2017-07-03: qty 200

## 2017-07-03 MED ORDER — CHLORHEXIDINE GLUCONATE 4 % EX LIQD: 60.0000 mL | Freq: Once | CUTANEOUS | Status: DC

## 2017-07-03 MED ORDER — LACTATED RINGERS IV SOLN
INTRAVENOUS | Status: DC
  Administered 2017-07-03: 17:00:00 via INTRAVENOUS

## 2017-07-03 MED ORDER — ONDANSETRON HCL 4 MG/2ML IJ SOLN
4.0000 mg | Freq: Four times a day (QID) | INTRAMUSCULAR | Status: DC | PRN
Start: 2017-07-03 — End: 2017-07-08
  Administered 2017-07-03: 4 mg via INTRAVENOUS
  Filled 2017-07-03: qty 2

## 2017-07-03 MED ORDER — OXYCODONE HCL 5 MG PO TABS
5.0000 mg | ORAL_TABLET | ORAL | Status: DC | PRN
  Administered 2017-07-03: 5 mg via ORAL
  Filled 2017-07-03: qty 1

## 2017-07-03 MED ORDER — STERILE WATER FOR IRRIGATION IR SOLN
Status: DC | PRN
  Administered 2017-07-03: 1000 mL

## 2017-07-03 SURGICAL SUPPLY — 58 items
BAG DECANTER FOR FLEXI CONT (MISCELLANEOUS) ×4 IMPLANT
BANDAGE ESMARK 6X9 LF (GAUZE/BANDAGES/DRESSINGS) ×1 IMPLANT
BLADE CLIPPER SURG (BLADE) IMPLANT
BLADE SAGITTAL 25.0X1.19X90 (BLADE) ×4 IMPLANT
BLADE SAW SGTL 13.0X1.19X90.0M (BLADE) ×2 IMPLANT
BLADE SURG 10 STRL SS (BLADE) ×4 IMPLANT
BNDG COHESIVE 6X5 TAN STRL LF (GAUZE/BANDAGES/DRESSINGS) ×2 IMPLANT
BNDG ELASTIC 6X10 VLCR STRL LF (GAUZE/BANDAGES/DRESSINGS) ×4 IMPLANT
BNDG ESMARK 6X9 LF (GAUZE/BANDAGES/DRESSINGS) ×2
BOWL SMART MIX CTS (DISPOSABLE) ×4 IMPLANT
CAPT KNEE TOTAL 3 ATTUNE ×4 IMPLANT
CEMENT HV SMART SET (Cement) ×8 IMPLANT
CLSR STERI-STRIP ANTIMIC 1/2X4 (GAUZE/BANDAGES/DRESSINGS) ×4 IMPLANT
COVER SURGICAL LIGHT HANDLE (MISCELLANEOUS) ×2 IMPLANT
CUFF TOURNIQUET SINGLE 34IN LL (TOURNIQUET CUFF) ×4 IMPLANT
DECANTER SPIKE VIAL GLASS SM (MISCELLANEOUS) ×2 IMPLANT
DRAPE EXTREMITY BILATERAL (DRAPES) ×2 IMPLANT
DRAPE HALF SHEET 40X57 (DRAPES) ×4 IMPLANT
DRAPE U-SHAPE 47X51 STRL (DRAPES) ×4 IMPLANT
DRSG AQUACEL AG ADV 3.5X10 (GAUZE/BANDAGES/DRESSINGS) ×4 IMPLANT
DURAPREP 26ML APPLICATOR (WOUND CARE) ×4 IMPLANT
ELECT REM PT RETURN 9FT ADLT (ELECTROSURGICAL) ×2
ELECTRODE REM PT RTRN 9FT ADLT (ELECTROSURGICAL) ×1 IMPLANT
GLOVE BIO SURGEON STRL SZ8 (GLOVE) ×8 IMPLANT
GLOVE BIOGEL PI IND STRL 8 (GLOVE) ×2 IMPLANT
GLOVE BIOGEL PI INDICATOR 8 (GLOVE) ×2
GOWN STRL REUS W/ TWL LRG LVL3 (GOWN DISPOSABLE) ×2 IMPLANT
GOWN STRL REUS W/ TWL XL LVL3 (GOWN DISPOSABLE) ×2 IMPLANT
GOWN STRL REUS W/TWL LRG LVL3 (GOWN DISPOSABLE) ×2
GOWN STRL REUS W/TWL XL LVL3 (GOWN DISPOSABLE) ×2
HANDPIECE INTERPULSE COAX TIP (DISPOSABLE) ×1
HOOD PEEL AWAY FACE SHEILD DIS (HOOD) ×4 IMPLANT
IMMOBILIZER KNEE 22 UNIV (SOFTGOODS) ×4 IMPLANT
KIT BASIN OR (CUSTOM PROCEDURE TRAY) ×2 IMPLANT
KIT ROOM TURNOVER OR (KITS) ×2 IMPLANT
MANIFOLD NEPTUNE II (INSTRUMENTS) ×2 IMPLANT
NEEDLE HYPO 22GX1.5 SAFETY (NEEDLE) ×4 IMPLANT
NS IRRIG 1000ML POUR BTL (IV SOLUTION) ×2 IMPLANT
PACK TOTAL JOINT (CUSTOM PROCEDURE TRAY) ×2 IMPLANT
PAD ARMBOARD 7.5X6 YLW CONV (MISCELLANEOUS) ×6 IMPLANT
SET HNDPC FAN SPRY TIP SCT (DISPOSABLE) ×1 IMPLANT
STOCKINETTE IMPERVIOUS LG (DRAPES) ×2 IMPLANT
STRIP CLOSURE SKIN 1/2X4 (GAUZE/BANDAGES/DRESSINGS) ×8 IMPLANT
SUCTION FRAZIER HANDLE 10FR (MISCELLANEOUS) ×1
SUCTION TUBE FRAZIER 10FR DISP (MISCELLANEOUS) ×1 IMPLANT
SUT VIC AB 0 CT1 27 (SUTURE) ×4
SUT VIC AB 0 CT1 27XBRD ANBCTR (SUTURE) ×4 IMPLANT
SUT VIC AB 2-0 CT1 27 (SUTURE) ×4
SUT VIC AB 2-0 CT1 TAPERPNT 27 (SUTURE) ×4 IMPLANT
SUT VIC AB 3-0 X1 27 (SUTURE) ×4 IMPLANT
SUT VLOC 180 0 24IN GS25 (SUTURE) ×4 IMPLANT
SYR 50ML SLIP (SYRINGE) IMPLANT
SYRINGE 60CC LL (MISCELLANEOUS) ×4 IMPLANT
TOWEL OR 17X24 6PK STRL BLUE (TOWEL DISPOSABLE) ×2 IMPLANT
TOWEL OR 17X26 10 PK STRL BLUE (TOWEL DISPOSABLE) ×2 IMPLANT
TRAY CATH 16FR W/PLASTIC CATH (SET/KITS/TRAYS/PACK) IMPLANT
TRAY FOLEY CATH 14FRSI W/METER (CATHETERS) ×2 IMPLANT
WATER STERILE IRR 1000ML POUR (IV SOLUTION) IMPLANT

## 2017-07-03 NOTE — Op Note (Signed)
PREOP DIAGNOSIS: DJD RIGHT KNEE and DJD LEFT KNEE POSTOP DIAGNOSIS: same PROCEDURE: RIGHT TKR and LEFT TKR ANESTHESIA: Epidural and MAC ATTENDING SURGEON: Nowell Sites G ASSISTANT: Loni Dolly PA  INDICATIONS FOR PROCEDURE: Samantha Curtis is a 53 y.o. female who has struggled for a long time with pain due to degenerative arthritis of the both knees.  The patient has failed many conservative non-operative measures and at this point has pain which limits the ability to sleep and walk.  The patient is offered total knee replacement on both sidest.  Informed operative consent was obtained after discussion of possible risks of anesthesia, infection, neurovascular injury, DVT, and death.  The importance of the post-operative rehabilitation protocol to optimize result was stressed extensively with the patient.  SUMMARY OF FINDINGS AND PROCEDURE:  Samantha Curtis was taken to the operative suite where under the above anesthesia a right knee replacement was performed.  There were advanced degenerative changes and the bone quality was excellent.  We used the DePuy Attune system and placed size 5 femur, 5 tibia, 38 mm all polyethylene patella, and a size 5 mm spacer.  Loni Dolly PA-C assisted throughout and was invaluable to the completion of the case in that he helped retract and maintain exposure while I placed components.  He also helped close thereby minimizing OR time. Once the right sided procedure was completed we performed an identical procedure on the opposite left knee utilizing the same size parts.  The patient was admitted for appropriate post-op care to include perioperative antibiotics and mechanical and pharmacologic measures for DVT prophylaxis.  DESCRIPTION OF PROCEDURE:  Samantha Curtis was taken to the operative suite where the above anesthesia was applied.  The patient was positioned supine and prepped and draped in normal sterile fashion.  An appropriate time out was performed.  After the  administration of vancomycin  pre-op antibiotic the leg was elevated and exsanguinated and a tourniquet inflated. A standard longitudinal incision was made on the anterior knee.  Dissection was carried down to the extensor mechanism.  All appropriate anti-infective measures were used including the pre-operative antibiotic, betadine impregnated drape, and closed hooded exhaust systems for each member of the surgical team.  A medial parapatellar incision was made in the extensor mechanism and the knee cap flipped and the knee flexed.  Some residual meniscal tissues were removed along with any remaining ACL/PCL tissue.  A guide was placed on the tibia and a flat cut was made on it's superior surface.  An intramedullary guide was placed in the femur and was utilized to make anterior and posterior cuts creating an appropriate flexion gap.  A second intramedullary guide was placed in the femur to make a distal cut properly balancing the knee with an extension gap equal to the flexion gap.  The three bones sized to the above mentioned sizes and the appropriate guides were placed and utilized.  A trial reduction was done and the knee easily came to full extension and the patella tracked well on flexion.  The trial components were removed and all bones were cleaned with pulsatile lavage and then dried thoroughly.  Cement was mixed and was pressurized onto the bones followed by placement of the aforementioned components.  Excess cement was trimmed and pressure was held on the components until the cement had hardened.  The tourniquet was deflated and a small amount of bleeding was controlled with cautery and pressure.  The knee was irrigated thoroughly.  The extensor mechanism was re-approximated  with V-loc suture in running fashion.  The knee was flexed and the repair was solid.  The subcutaneous tissues were re-approximated with #0 and #2-0 vicryl and the skin closed with a subcuticular stitch and steristrips.  A sterile  dressing was applied.  We then performed an identical procedure on the opposite knee placing the same size parts.  Intraoperative fluids, EBL, and tourniquet time can be obtained from anesthesia records.  DISPOSITION:  The patient was taken to recovery room in stable condition and admitted for appropriate post-op care to include peri-operative antibiotic and DVT prophylaxis with mechanical and pharmacologic measures.  Samantha Curtis G 07/03/2017, 10:51 AM

## 2017-07-03 NOTE — Interval H&P Note (Signed)
History and Physical Interval Note:  07/03/2017 7:24 AM  Samantha Curtis  has presented today for surgery, with the diagnosis of BILATERAL KNEE DEGENERATIVE JOINT DISEASE  The various methods of treatment have been discussed with the patient and family. After consideration of risks, benefits and other options for treatment, the patient has consented to  Procedure(s): TOTAL KNEE BILATERAL (Bilateral) as a surgical intervention .  The patient's history has been reviewed, patient examined, no change in status, stable for surgery.  I have reviewed the patient's chart and labs.  Questions were answered to the patient's satisfaction.     Samantha Curtis G

## 2017-07-03 NOTE — Anesthesia Preprocedure Evaluation (Signed)
Anesthesia Evaluation  Patient identified by MRN, date of birth, ID band Patient awake    Reviewed: Allergy & Precautions, NPO status , Patient's Chart, lab work & pertinent test results  Airway Mallampati: II  TM Distance: >3 FB Neck ROM: Full    Dental  (+) Teeth Intact, Dental Advisory Given   Pulmonary    breath sounds clear to auscultation       Cardiovascular  Rhythm:Regular Rate:Normal     Neuro/Psych    GI/Hepatic   Endo/Other    Renal/GU      Musculoskeletal   Abdominal   Peds  Hematology   Anesthesia Other Findings   Reproductive/Obstetrics                             Anesthesia Physical Anesthesia Plan  ASA: II  Anesthesia Plan: Spinal and Epidural   Post-op Pain Management:    Induction:   PONV Risk Score and Plan: Ondansetron and Dexamethasone  Airway Management Planned: Simple Face Mask and Natural Airway  Additional Equipment:   Intra-op Plan:   Post-operative Plan:   Informed Consent: I have reviewed the patients History and Physical, chart, labs and discussed the procedure including the risks, benefits and alternatives for the proposed anesthesia with the patient or authorized representative who has indicated his/her understanding and acceptance.     Plan Discussed with: CRNA and Anesthesiologist  Anesthesia Plan Comments:         Anesthesia Quick Evaluation

## 2017-07-03 NOTE — Progress Notes (Signed)
Anesthesiology Note:  As noted Ms. Pelot underwent bilateral total knee arthroplasties under combined  spinal and epidural anesthesia. Patient now on 5N, epidural ropivacaine 0.2% at 8 cc/hr. She can move her toes but unable to lift her legs. No LE pain, some upper back discomfort. She says she does not like being unable to move her legs.   Plan: Will pause epidural infusion and observe if knee pain becomes pronounced will restart epidural at a lower rate.  Kipp Broodavid Kenyana Husak

## 2017-07-03 NOTE — Anesthesia Postprocedure Evaluation (Signed)
Anesthesia Post Note  Patient: Samantha Curtis  Procedure(s) Performed: TOTAL KNEE BILATERAL (Bilateral Knee)     Patient location during evaluation: PACU Anesthesia Type: Spinal Level of consciousness: awake Pain management: pain level controlled Vital Signs Assessment: post-procedure vital signs reviewed and stable Respiratory status: spontaneous breathing, respiratory function stable and patient connected to nasal cannula oxygen Cardiovascular status: blood pressure returned to baseline and stable Postop Assessment: no headache, no backache, no apparent nausea or vomiting and spinal receding Anesthetic complications: no    Last Vitals:  Vitals:   07/03/17 1615 07/03/17 1629  BP: 99/60 101/63  Pulse: (!) 52 (!) 52  Resp: 13 16  Temp: 36.5 C   SpO2: 100% 100%    Last Pain:  Vitals:   07/03/17 1808  TempSrc:   PainSc: Asleep                 Terrilee Dudzik COKER

## 2017-07-03 NOTE — Anesthesia Procedure Notes (Signed)
Spinal  Patient location during procedure: OR Start time: 07/03/2017 7:47 AM Staffing Anesthesiologist: Mal AmabileFoster, Lamond Glantz, MD Performed: anesthesiologist  Preanesthetic Checklist Completed: patient identified, site marked, surgical consent, pre-op evaluation, timeout performed, IV checked, risks and benefits discussed and monitors and equipment checked Spinal Block Patient position: sitting Prep: site prepped and draped and DuraPrep Patient monitoring: cardiac monitor, continuous pulse ox, blood pressure and heart rate Approach: midline Location: L3-4 Injection technique: catheter Needle Needle type: Tuohy and Spinocan  Needle gauge: 25 G Needle length: 12.7 cm Needle insertion depth: 5 cm Catheter type: closed end flexible Catheter size: 19 g Catheter at skin depth: 4 cm Assessment Sensory level: T4 Additional Notes Epidural performed as above. SAB performed through Epidural needle. CSF clear, free flow, no paresthesias. LA injected and spinal needle withdrawn. Epidural catheter threaded 5cm into the epidural space. Epidural needle withdrawn and sterile dressing applied. Patient placed in the supine position. Adequate sensory level.

## 2017-07-03 NOTE — Transfer of Care (Signed)
Immediate Anesthesia Transfer of Care Note  Patient: Samantha Curtis  Procedure(s) Performed: TOTAL KNEE BILATERAL (Bilateral Knee)  Patient Location: PACU  Anesthesia Type:Spinal and Epidural  Level of Consciousness: awake, sedated and drowsy  Airway & Oxygen Therapy: Patient Spontanous Breathing and Patient connected to face mask oxygen  Post-op Assessment: Report given to RN and Post -op Vital signs reviewed and stable  Post vital signs: Reviewed and stable  Last Vitals:  Vitals:   07/03/17 0556  BP: 116/74  Pulse: 63  Resp: 18  Temp: 36.8 C  SpO2: 100%    Last Pain:  Vitals:   07/03/17 0556  TempSrc: Oral         Complications: No apparent anesthesia complications

## 2017-07-03 NOTE — Anesthesia Procedure Notes (Signed)
Procedure Name: MAC Date/Time: 07/03/2017 7:54 AM Performed by: Izora Gala, CRNA Pre-anesthesia Checklist: Patient identified, Emergency Drugs available, Suction available and Patient being monitored Patient Re-evaluated:Patient Re-evaluated prior to induction Oxygen Delivery Method: Simple face mask Preoxygenation: Pre-oxygenation with 100% oxygen Induction Type: IV induction Ventilation: Mask ventilation without difficulty and Oral airway inserted - appropriate to patient size Placement Confirmation: positive ETCO2

## 2017-07-03 NOTE — H&P (Signed)
Samantha CorningPeter Enrica Corliss, MD  Samantha ColanderMike Carnaghi, PA-C  Samantha FlorenceAndrew Nida, PA-C                                  [facility]                [address], [place], [place]  [id number]   ORTHOPAEDIC CONSULTATION  Samantha MartRana S Curtis            MRN:  [id number] DOB/SEX:  1966-02-02/female     CHIEF COMPLAINT:  Painful knees  HISTORY: Samantha S Hamziis a 52 y.o. female with years of bilateral knee pain. At this point she cannot kneel or climb or rest. She has been treated with injections, medications, braces, even arthroscopy in 2011.  She is offered TKR bilat at this time.   PAST MEDICAL HISTORY: Patient Active Problem List   Diagnosis Date Noted  . Lateral epicondylitis of right elbow 04/02/2013   Past Medical History:  Diagnosis Date  . Arthritis   . Dysrhythmia    PALPITATIONS  . GERD (gastroesophageal reflux disease)   . Leg pain    Past Surgical History:  Procedure Laterality Date  . ABDOMINAL HYSTERECTOMY    . ENDOVENOUS ABLATION SAPHENOUS VEIN W/ LASER Left 2005   [facility]  . KNEE ARTHROSCOPY Right   . SHOULDER ARTHROSCOPY Right   . TUBAL LIGATION       MEDICATIONS:   Current Facility-Administered Medications:  .  bupivacaine liposome (EXPAREL) 1.3 % injection 266 mg, 20 mL, Infiltration, To OR, Samantha Sherrard, MD .  chlorhexidine (HIBICLENS) 4 % liquid 4 application, 60 mL, Topical, Once, Samantha Corningalldorf, Samantha Amescua, MD .  lactated ringers infusion, , Intravenous, Continuous, Samantha Corningalldorf, Samantha Vanderslice, MD .  metoprolol tartrate (LOPRESSOR) tablet 25 mg, 25 mg, Oral, Once, Samantha Curtis, David, MD .  tranexamic acid (CYKLOKAPRON) 2,000 mg in sodium chloride 0.9 % 50 mL Topical Application, 2,000 mg, Topical, To OR, Samantha Mcadoo, MD .  tranexamic acid (CYKLOKAPRON) 2,000 mg in sodium chloride 0.9 % 50 mL Topical Application, 2,000 mg, Topical, To OR, Samantha Pinho, MD .  vancomycin (VANCOCIN) IVPB 1000 mg/200 mL premix, 1,000 mg, Intravenous, To Samantha Curtis, Samantha Divelbiss, MD, 1,000 mg at 07/03/17  0645  ALLERGIES:   Allergies  Allergen Reactions  . Flexeril [Cyclobenzaprine] Hives  . Latex Other (See Comments)    Sores in mouth from dentist  . Amoxicillin-Pot Clavulanate Itching    Has patient had a PCN reaction causing immediate rash, facial/tongue/throat swelling, SOB or lightheadedness with hypotension: No Has patient had a PCN reaction causing severe rash involving mucus membranes or skin necrosis: No Has patient had a PCN reaction that required hospitalization: No Has patient had a PCN reaction occurring within the last 10 years: No If all of the above answers are "NO", then may proceed with Cephalosporin use.   . Levofloxacin Nausea Only    REVIEW OF SYSTEMS: REVIEWED IN DETAIL IN CHART  FAMILY HISTORY:  History reviewed. No pertinent family history.  SOCIAL HISTORY:   Social History   Tobacco Use  . Smoking status: Never Smoker  . Smokeless tobacco: Never Used  Substance Use Topics  . Alcohol use: No     EXAMINATION: Vital signs in last 24 hours: Temp:  [98.3 F (36.8 C)] 98.3 F (36.8 C) (01/15 0556) Pulse Rate:  [63] 63 (01/15 0556) Resp:  [18] 18 (01/15 0556) BP: (116)/(74) 116/74 (01/15 0556) SpO2:  [100 %] 100 % (  01/15 0556)  BP 116/74   Pulse 63   Temp 98.3 F (36.8 C) (Oral)   Resp 18   SpO2 100%   General Appearance:    Alert, cooperative, no distress, appears stated age  Head:    Normocephalic, without obvious abnormality, atraumatic  Eyes:    PERRL, conjunctiva/corneas clear, EOM's intact, fundi    benign, both eyes  Ears:    Normal TM's and external ear canals, both ears  Nose:   Nares normal, septum midline, mucosa normal, no drainage    or sinus tenderness  Throat:   Lips, mucosa, and tongue normal; teeth and gums normal  Neck:   Supple, symmetrical, trachea midline, no adenopathy;    thyroid:  no enlargement/tenderness/nodules; no carotid   bruit or JVD  Back:     Symmetric, no curvature, ROM normal, no CVA tenderness  Lungs:      Clear to auscultation bilaterally, respirations unlabored  Chest Wall:    No tenderness or deformity   Heart:    Regular rate and rhythm, S1 and S2 normal, no murmur, rub   or gallop  Breast Exam:    No tenderness, masses, or nipple abnormality  Abdomen:     Soft, non-tender, bowel sounds active all four quadrants,    no masses, no organomegaly  Genitalia:    Rectal:    Extremities:   Extremities normal, atraumatic, no cyanosis or edema  Pulses:   2+ and symmetric all extremities  Skin:   Skin color, texture, turgor normal, no rashes or lesions  Lymph nodes:   Cervical, supraclavicular, and axillary nodes normal  Neurologic:   CNII-XII intact, normal strength, sensation and reflexes    throughout    Musculoskeletal Exam:   Both knees move 0-120 with crepitation and medial joint line pain.   DIAGNOSTIC STUDIES: Recent laboratory studies: Recent Labs    06/26/17 1442  WBC 5.7  HGB 13.2  HCT 41.7  PLT 220   Recent Labs    06/26/17 1442  NA 135  K 4.5  CL 100*  CO2 28  BUN 6  CREATININE 0.74  GLUCOSE 87  CALCIUM 9.4   Lab Results  Component Value Date   INR 1.02 06/26/2017     Recent Radiographic Studies :  Dg Chest 2 View  Result Date: 06/27/2017 CLINICAL DATA:  Bilateral knee replacements, preop. EXAM: CHEST  2 VIEW COMPARISON:  None. FINDINGS: Heart and mediastinal contours are within normal limits. No focal opacities or effusions. No acute bony abnormality. IMPRESSION: No active cardiopulmonary disease. Electronically Signed   By: Charlett Nose M.D.   On: 06/27/2017 08:10    ASSESSMENT:  Bilateral end stage patellofemoral DJD   PLAN:  Plan is for bilateral TKR.  Risks discussed with patient in detail. Postop rehab stressed. Will plan on ASA for DVT prophylaxis.  Samantha Curtis G 07/03/2017, 7:20 AM

## 2017-07-04 ENCOUNTER — Encounter (HOSPITAL_COMMUNITY): Payer: Self-pay | Admitting: Physical Medicine and Rehabilitation

## 2017-07-04 ENCOUNTER — Other Ambulatory Visit: Payer: Self-pay

## 2017-07-04 DIAGNOSIS — K219 Gastro-esophageal reflux disease without esophagitis: Secondary | ICD-10-CM

## 2017-07-04 DIAGNOSIS — M170 Bilateral primary osteoarthritis of knee: Principal | ICD-10-CM

## 2017-07-04 DIAGNOSIS — R030 Elevated blood-pressure reading, without diagnosis of hypertension: Secondary | ICD-10-CM

## 2017-07-04 DIAGNOSIS — G8918 Other acute postprocedural pain: Secondary | ICD-10-CM

## 2017-07-04 DIAGNOSIS — Z96653 Presence of artificial knee joint, bilateral: Secondary | ICD-10-CM

## 2017-07-04 MED ORDER — METHOCARBAMOL 750 MG PO TABS
750.0000 mg | ORAL_TABLET | Freq: Four times a day (QID) | ORAL | Status: DC | PRN
  Administered 2017-07-04 – 2017-07-08 (×13): 750 mg via ORAL
  Filled 2017-07-04 (×13): qty 1

## 2017-07-04 NOTE — Progress Notes (Signed)
Physical Therapy Treatment Patient Details Name: Samantha Curtis MRN: [id number] DOB: 06-Sep-1965 Today's Date: 07/04/2017    History of Present Illness 52yo female s/p B TKRs done 07/03/17. PMH dysrhythmia, GERD, hx R knee and R shoulder arthroscopies     PT Comments    Patient received in bed, lethargic but able to open eyes and follow cues from PT, willing and appears able to participate in PT session today. Began with review of TKR exercises, focusing on supine exercises today due to high pain levels in bilateral knees this afternoon, husband present and observed exercises as well today. She continues to require Mod assist for functional bed mobility and transfers today, able to ambulate approximately 3 ft forward and backward in front of the bed as well as able to take 3 side steps at the edge of the bed before losing her balance and requiring Mod assist for safe return to sitting at EOB. She was left in bed with all needs met and questions/concerns addressed this afternoon.    Follow Up Recommendations  DC plan and follow up therapy as arranged by surgeon;CIR(strongly recommend CIR )     Equipment Recommendations  Rolling walker with 5" wheels;3in1 (PT)    Recommendations for Other Services       Precautions / Restrictions Precautions Precautions: Fall;Knee Precaution Comments: B knee immobilizers  Required Braces or Orthoses: Knee Immobilizer - Right;Knee Immobilizer - Left Knee Immobilizer - Right: On except when in CPM Knee Immobilizer - Left: On except when in CPM Restrictions Weight Bearing Restrictions: Yes RLE Weight Bearing: Weight bearing as tolerated LLE Weight Bearing: Weight bearing as tolerated    Mobility  Bed Mobility Overal bed mobility: Needs Assistance Bed Mobility: Supine to Sit;Sit to Supine     Supine to sit: Mod assist Sit to supine: Mod assist   General bed mobility comments: assist to bring legs around to EOB, also assist with bring LEs back into  the bed at end of session   Transfers Overall transfer level: Needs assistance Equipment used: Rolling walker (2 wheeled) Transfers: Sit to/from Stand Sit to Stand: Mod assist Stand pivot transfers: Min assist       General transfer comment: Mod assist to get up to feet/min guard to ensure balance standing at EOB inside of walker   Ambulation/Gait Ambulation/Gait assistance: Min guard Ambulation Distance (Feet): 3 Feet Assistive device: Rolling walker (2 wheeled) Gait Pattern/deviations: Step-through pattern;Decreased step length - right;Decreased step length - left;Decreased dorsiflexion - right;Decreased dorsiflexion - left;Antalgic;Trunk flexed     General Gait Details: able to gait train 61f forward and backward in front of bed, able to also take 3 side steps up to [facility]before losing balance and requiring Mod assist for safe return to sitting at [employer]   Stairs            Wheelchair Mobility    Modified Rankin (Stroke Patients Only)       Balance Overall balance assessment: Needs assistance Sitting-balance support: Bilateral upper extremity supported;Feet supported Sitting balance-[name]: Good     Standing balance support: Bilateral upper extremity supported Standing balance-[name]: Poor Standing balance comment: relies heavily on RW                            Cognition Arousal/Alertness: Lethargic;Suspect due to medications Behavior During Therapy: [facility] tasks assessed/performed Overall Cognitive Status: Within Functional Limits for tasks assessed  General Comments: patient with high reported pain levels today, eyes partially closed but able to follow cues and appropriately respond to PT       Exercises Total Joint Exercises Ankle Circles/Pumps: Both;5 reps;Supine Quad Sets: Both;5 reps;Supine Towel Squeeze: Both;5 reps;Supine Short Arc Quad: Both;5 reps;Supine Heel Slides: Both;5  reps;Supine Hip ABduction/ADduction: Both;5 reps;Supine Straight Leg Raises: 5 reps;Both;Supine    General Comments General comments (skin integrity, edema, etc.): continues to be pain limited, lethargic today possible due to medications       Pertinent Vitals/Pain Pain Assessment: 0-10 Pain Score: 8  Pain Location: B knees, R>L  Pain Descriptors / Indicators: Aching;Sore;Guarding;Operative site guarding Pain Intervention(s): Monitored during session;Limited activity within patient's tolerance;Repositioned    Home Living Family/patient expects to be discharged to:: Private residence Living Arrangements: Spouse/significant other Available Help at Discharge: Family Type of Home: House Home Access: Stairs to enter Entrance Stairs-Rails: Left Home Layout: Two level Home Equipment: Environmental consultant - 2 wheels;Shower seat - built in Additional Comments: works for [employer]. pt has fellow employee enternig the room throughout session. Spouse is present at evaluation. Staff friends making recommendations to patient during session that are not appropriate to her person needs example : WHy does she have those black things I didnt have those. You dont need those! She doesnt have to have those does she?    Prior Function Level of Independence: Independent          PT Goals (current goals can now be found in the care plan section) Acute Rehab PT Goals Patient Stated Goal: to get better/go home as independently as possible  PT Goal Formulation: With patient Time For Goal Achievement: 07/18/17 Potential to Achieve Goals: Good Progress towards PT goals: Progressing toward goals    Frequency    7X/week      PT Plan Current plan remains appropriate    Co-evaluation              AM-PAC PT "6 Clicks" Daily Activity  Outcome Measure  Difficulty turning over in bed (including adjusting bedclothes, sheets and blankets)?: Unable Difficulty moving from lying on back to sitting on the side of the  bed? : Unable Difficulty sitting down on and standing up from a chair with arms (e.g., wheelchair, bedside commode, etc,.)?: Unable Help needed moving to and from a bed to chair (including a wheelchair)?: A Lot Help needed walking in hospital room?: A Lot Help needed climbing 3-5 steps with a railing? : Total 6 Click Score: 8    End of Session Equipment Utilized During Treatment: Gait belt;Left knee immobilizer;Right knee immobilizer Activity Tolerance: Patient limited by pain Patient left: in bed;with call bell/phone within reach;with family/visitor present   PT Visit Diagnosis: Unsteadiness on feet (R26.81);Muscle weakness (generalized) (M62.81);Difficulty in walking, not elsewhere classified (R26.2);Pain Pain - Right/Left: (bilateral knees ) Pain - part of body: Knee     Time: 1435-1510 PT Time Calculation (min) (ACUTE ONLY): 35 min  Charges:  $Gait Training: 8-22 mins $Therapeutic Exercise: 8-22 mins                    G Codes:       Deniece Ree PT, DPT, CBIS  Supplemental Physical Therapist [place]   Pager [phone]

## 2017-07-04 NOTE — Progress Notes (Signed)
As per telephone call with Dr Noreene LarssonJoslin, he ordered to have epidural restarted at a rate of 4cc/hr.

## 2017-07-04 NOTE — Progress Notes (Signed)
Telephone call to Dr. Noreene LarssonJoslin informed that patient pain is not able to be managed with medication. Patient request that epidural be restarted. Charge RN Roni made aware and spoke with MD. 10mg  Oxi IR given. Will continue to monitor. Ilean SkillVeronica Tiffney Haughton LPN

## 2017-07-04 NOTE — Addendum Note (Signed)
Addendum  created 07/04/17 0756 by Kipp BroodJoslin, Ranisha Allaire, MD   Order list changed, Sign clinical note

## 2017-07-04 NOTE — Progress Notes (Signed)
Dr Noreene LarssonJoslin requested that foley remain while epidural in place. Ilean SkillVeronica Kaya Pottenger LPN

## 2017-07-04 NOTE — Evaluation (Signed)
Physical Therapy Evaluation Patient Details Name: Samantha Curtis MRN: [id number] DOB: 11-30-1965 Today's Date: 07/04/2017   History of Present Illness  52yo female s/p B TKRs done 07/03/17. PMH dysrhythmia, GERD, hx R knee and R shoulder arthroscopies   Clinical Impression  Patient received in bed, pleasant and willing to participate with skilled PT services this morning but reporting high levels of pain in B knees, R>L; note epidural catheter was removed this morning by MD. L knee AROM 26 degrees extension to 43 degrees flexion, R knee AROM 21 degrees extension to 35 degrees flexion in supine, pain limited. Patient requires Mod assist for functional bed mobility and sit to stand transfer, however she is able to perform stand pivot transfer to chair with RW and Min assist/Mod VC for safety. Did not attempt ambulation beyond 2-3 steps during stand pivot transfer to chair due to high pain levels, RN notified regarding pain this morning. Patient may benefit from referral to CIR pending MD approval. She was left up in the chair with all needs met and RN present/attending.     Follow Up Recommendations DC plan and follow up therapy as arranged by surgeon;CIR(consider CIR to optimize functional performance and safety for return home )    Equipment Recommendations  Rolling walker with 5" wheels;3in1 (PT)    Recommendations for Other Services       Precautions / Restrictions Precautions Precautions: Fall;Knee Precaution Comments: B knee immobilizers  Required Braces or Orthoses: Knee Immobilizer - Right;Knee Immobilizer - Left Knee Immobilizer - Right: On except when in CPM Knee Immobilizer - Left: On except when in CPM Restrictions Weight Bearing Restrictions: Yes RLE Weight Bearing: Weight bearing as tolerated LLE Weight Bearing: Weight bearing as tolerated      Mobility  Bed Mobility Overal bed mobility: Needs Assistance Bed Mobility: Supine to Sit     Supine to sit: Mod assist      General bed mobility comments: assist bringing LEs to side of bed and powering up trunk, VC for sliding forward to EOB so feet are on floor   Transfers Overall transfer level: Needs assistance Equipment used: Rolling walker (2 wheeled) Transfers: Sit to/from [facility] Sit to Stand: Mod assist Stand pivot transfers: Min assist       General transfer comment: Mod assist to get to feet, Min assist/Mod VC for stand pivot transfer   Ambulation/Gait             General Gait Details: DNT today due to pain, able to take 2-3 steps during stand pivot but very pain limited   Stairs            Wheelchair Mobility    Modified Rankin (Stroke Patients Only)       Balance Overall balance assessment: Needs assistance Sitting-balance support: Bilateral upper extremity supported;Feet supported Sitting balance-[name]: Good     Standing balance support: Bilateral upper extremity supported;During functional activity Standing balance-[name]: Poor Standing balance comment: reliant on B UE support, posterior unsteadiness noted                              Pertinent Vitals/Pain Pain Assessment: 0-10 Pain Score: 9  Pain Location: B knees, R>L  Pain Descriptors / Indicators: Aching;Sore;Guarding;Operative site guarding Pain Intervention(s): Limited activity within patient's tolerance;Monitored during session;Patient requesting pain meds-RN notified;Repositioned    Home Living Family/patient expects to be discharged to:: Private residence Living Arrangements: Spouse/significant other Available Help  at Discharge: Family Type of Home: House Home Access: Stairs to enter Entrance Stairs-Rails: Left Entrance Stairs-Number of Steps: 1 with [place]: Two level Home Equipment: Walker - 2 wheels;Shower seat - built in      Prior Function Level of Independence: Independent               Journalist, newspaper         Extremity/Trunk Assessment   Upper Extremity Assessment Upper Extremity Assessment: Defer to OT evaluation    Lower Extremity Assessment Lower Extremity Assessment: Generalized weakness    Cervical / Trunk Assessment Cervical / Trunk Assessment: Normal  Communication   Communication: No difficulties  Cognition Arousal/Alertness: Awake/alert Behavior During Therapy: WFL for tasks assessed/performed Overall Cognitive Status: Within Functional Limits for tasks assessed                                        General Comments      Exercises Total Joint Exercises Goniometric ROM: L knee AROM 26 degrees extension 43 degrees flexion; R knee AROM 21 degrees extension 35 degrees flexion in supine    Assessment/Plan    PT Assessment Patient needs continued PT services  PT Problem List Decreased strength;Decreased mobility;Decreased range of motion;Decreased coordination;Decreased balance;Decreased knowledge of use of DME;Pain;Decreased skin integrity       PT Treatment Interventions DME instruction;Therapeutic activities;Gait training;Therapeutic exercise;Patient/family education;Stair training;Balance training;Functional mobility training;Neuromuscular re-education;Manual techniques    PT Goals (Current goals can be found in the Care Plan section)  Acute Rehab PT Goals Patient Stated Goal: to get better/be as independent as possible at home  PT Goal Formulation: With patient Time For Goal Achievement: 07/18/17 Potential to Achieve Goals: Good    Frequency 7X/week   Barriers to discharge        Co-evaluation               AM-PAC PT "6 Clicks" Daily Activity  Outcome Measure Difficulty turning over in bed (including adjusting bedclothes, sheets and blankets)?: Unable Difficulty moving from lying on back to sitting on the side of the bed? : Unable Difficulty sitting down on and standing up from a chair with arms (e.g., wheelchair, bedside commode,  etc,.)?: Unable Help needed moving to and from a bed to chair (including a wheelchair)?: A Lot Help needed walking in hospital room?: A Lot Help needed climbing 3-5 steps with a railing? : Total 6 Click Score: 8    End of Session Equipment Utilized During Treatment: Gait belt;Left knee immobilizer;Right knee immobilizer Activity Tolerance: Patient limited by pain Patient left: in chair;with call bell/phone within reach;with nursing/sitter in room Nurse Communication: Mobility status;Patient requests pain meds PT Visit Diagnosis: Unsteadiness on feet (R26.81);Muscle weakness (generalized) (M62.81);Difficulty in walking, not elsewhere classified (R26.2);Pain Pain - Right/Left: Right(bilateral knees ) Pain - part of body: Knee    Time: 0762-2633 PT Time Calculation (min) (ACUTE ONLY): 34 min   Charges:   PT Evaluation $PT Eval Low Complexity: 1 Low PT Treatments $Therapeutic Activity: 8-22 mins   PT G Codes:        Deniece Ree PT, DPT, CBIS  Supplemental Physical Therapist [place]   Pager [phone]

## 2017-07-04 NOTE — Addendum Note (Signed)
Addendum  created 07/04/17 0730 by Kipp BroodJoslin, Wisdom Seybold, MD   Sign clinical note

## 2017-07-04 NOTE — Consult Note (Addendum)
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 Jerl Santos   HPI: Samantha Curtis is a 52 y.o. 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 She was admitted on 07/03/17 for B-TKR. History taken from chart review and husband. Post op on ASA for DVT prophylaxis and has had high levels of pain affecting mobility. Epidural removed today and PT evaluation done -is WBAT BLE. CIR recommended due to functional deficits.   Review of Systems  Constitutional: Negative for chills and fever.  HENT: Negative for hearing loss and tinnitus.   Eyes: Negative for blurred vision and double vision.  Respiratory: Negative for cough and shortness of breath.   Cardiovascular: Negative for chest pain and palpitations.  Gastrointestinal: Negative for heartburn and nausea.  Musculoskeletal: Positive for joint pain and myalgias.  Skin: Negative for rash.  Neurological: Negative for headaches.  Psychiatric/Behavioral: The patient is nervous/anxious.   All other systems reviewed and are negative.  Past Medical History:  Diagnosis Date  . Arthritis   . Dysrhythmia    PALPITATIONS  . GERD (gastroesophageal reflux disease)   . Leg pain     Past Surgical History:  Procedure Laterality Date  . ABDOMINAL HYSTERECTOMY    . ENDOVENOUS ABLATION SAPHENOUS VEIN W/ LASER Left 2005   [facility]  . JOINT REPLACEMENT    . KNEE ARTHROSCOPY Right   . SHOULDER ARTHROSCOPY Right   . TOTAL KNEE ARTHROPLASTY Bilateral 07/03/2017  . TUBAL LIGATION      Family History  Problem Relation Age of Onset  . Dementia Mother      Social History:  Married. Works as a Engineer, structural at [employer]. Husband retired and will assist after discharge. She reports that  has never smoked. she has never used smokeless tobacco. She reports that she does not drink alcohol or use drugs.   Allergies  Allergen  Reactions  . Flexeril [Cyclobenzaprine] Hives  . Latex Other (See Comments)    Sores in mouth from dentist  . Amoxicillin-Pot Clavulanate Itching    Has patient had a PCN reaction causing immediate rash, facial/tongue/throat swelling, SOB or lightheadedness with hypotension: No Has patient had a PCN reaction causing severe rash involving mucus membranes or skin necrosis: No Has patient had a PCN reaction that required hospitalization: No Has patient had a PCN reaction occurring within the last 10 years: No If all of the above answers are "NO", then may proceed with Cephalosporin use.   . Levofloxacin Nausea Only    Medications Prior to Admission  Medication Sig Dispense Refill  . cetirizine (ZYRTEC) 10 MG tablet Take 10 mg by mouth daily.    . metoprolol tartrate (LOPRESSOR) 25 MG tablet Take 25 mg by mouth 2 (two) times daily.   1  . naproxen sodium (ALEVE) 220 MG tablet Take 440 mg by mouth 2 (two) times daily as needed (pain).    . ranitidine (ZANTAC) 150 MG tablet Take 150 mg by mouth daily.   3    Home: Home Living Family/patient expects to be discharged to:: Private residence Living Arrangements: Spouse/significant other Available Help at Discharge: Family Type of Home: House Home Access: Stairs to enter Secretary/administrator of Steps: 1 with railing  Entrance Stairs-Rails: Left Home Layout: Two level Alternate Level Stairs-Number of Steps: 20 Alternate Level Stairs-Rails: Left Bathroom Shower/Tub: Health visitor: Standard Home Equipment: Environmental consultant - 2 wheels,  Shower seat - built in  Functional History: Prior Function Level of Independence: Independent Functional Status:  Mobility: Bed Mobility Overal bed mobility: Needs Assistance Bed Mobility: Supine to Sit Supine to sit: Mod assist General bed mobility comments: assist bringing LEs to side of bed and powering up trunk, VC for sliding forward to EOB so feet are on floor  Transfers Overall transfer  level: Needs assistance Equipment used: Rolling walker (2 wheeled) Transfers: Sit to/from Stand, [employer] Transfers Sit to Stand: Mod assist Stand pivot transfers: Min assist General transfer comment: Mod assist to get to feet, Min assist/Mod VC for stand pivot transfer  Ambulation/Gait General Gait Details: DNT today due to pain, able to take 2-3 steps during stand pivot but very pain limited     ADL:    Cognition: Cognition Overall Cognitive Status: Within Functional Limits for tasks assessed Orientation Level: Oriented X4 Cognition Arousal/Alertness: Awake/alert Behavior During Therapy: WFL for tasks assessed/performed Overall Cognitive Status: Within Functional Limits for tasks assessed   Blood pressure (!) 111/56, pulse 70, temperature 98.8 F (37.1 C), temperature source Oral, resp. rate 16, SpO2 98 %. Physical Exam  Constitutional: She is oriented to person, place, and time. She appears well-developed and well-nourished. She appears distressed (in moderate discomfort/crying and distractred due to pain).  HENT:  Head: Normocephalic and atraumatic.  Eyes: Conjunctivae and EOM are normal. Pupils are equal, round, and reactive to light.  Neck: Normal range of motion. Neck supple.  Cardiovascular: Normal rate and regular rhythm.  Respiratory: Effort normal and breath sounds normal. No stridor. No respiratory distress. She has no wheezes.  GI: Soft. Bowel sounds are normal. She exhibits no distension. There is no tenderness.  Musculoskeletal:  BLE ace wrapped--ice packs and KI in place. PF/DF intact.  Neurological: She is alert and oriented to person, place, and time.  Motor: B/l UE 5/5 proximal to distal B/l LE: HF 2/5, knee immobilized, ADF/PF 4+/5 (pain inhibition) Sensation intact to light touch  Skin: Skin is warm and dry. She is not diaphoretic.  B/l LE with dressing c/d/i  Psychiatric: She has a normal mood and affect. Her speech is normal. Cognition and memory  are normal. She is inattentive.    No results found for this or any previous visit (from the past 24 hour(s)). No results found.  Assessment/Plan: Diagnosis: B/l TKR Labs independently reviewed.  Records reviewed and summated above.  1. Does the need for close, 24 hr/day medical supervision in concert with the patient's rehab needs make it unreasonable for this patient to be served in a less intensive setting? Potentially  2. Co-Morbidities requiring supervision/potential complications: GERD (cont meds), Varicose veins, post-op pain (Biofeedback training with therapies to help reduce reliance on opiate pain medications, particularly IV dilaudid, monitor pain control during therapies, and sedation at rest and titrate to maximum efficacy to ensure participation and gains in therapies), Elevated blood pressure (cont to monitor, consider meds if necessary) 3. Due to bowel management, safety, skin/wound care, disease management, pain management and patient education, does the patient require 24 hr/day rehab nursing? Potentially 4. Does the patient require coordinated care of a physician, rehab nurse, PT (1-2 hrs/day, 5 days/week) and OT (1-2 hrs/day, 5 days/week) to address physical and functional deficits in the context of the above medical diagnosis(es)? Yes Addressing deficits in the following areas: balance, endurance, locomotion, strength, transferring, bathing, dressing, toileting and psychosocial support 5. Can the patient actively participate in an intensive therapy program of at least 3 hrs of  therapy per day at least 5 days per week? Potentially 6. The potential for patient to make measurable gains while on inpatient rehab is excellent 7. Anticipated functional outcomes upon discharge from inpatient rehab are modified independent and supervision  with PT, modified independent and supervision with OT, n/a with SLP. 8. Estimated rehab length of stay to reach the above functional goals is: 7-10  days. 9. Anticipated D/C setting: Home 10. Anticipated post D/C treatments: HH therapy and Home excercise program 11. Overall Rehab/Functional Prognosis: excellent  RECOMMENDATIONS: This patient's condition is appropriate for continued rehabilitative care in the following setting: Pt severely limited by pain at present.  She would like to go home if she is able to stand.  Will follow for progress once pain improves and consider CIR if persistent functional deficits. Patient has agreed to participate in recommended program. Potentially Note that insurance prior authorization may be required for reimbursement for recommended care.  Comment: Rehab Admissions Coordinator to follow up.  Maryla MorrowAnkit Johnna Bollier, MD, ABPMR Jacquelynn CreePamela S Love, PA-C 07/04/2017

## 2017-07-04 NOTE — Progress Notes (Signed)
Anesthesiology Note:  As noted, epidural infusion paused yesterday at 7:00 PM. By 1:30 AM she was complaining of bilateral knee pain. Epidural infusion restarted at 4 cc/hr. Pain now improved but still having some LE discomfort, good motor strength in LEs. Will bolus epidural with 8 cc 0.2% ropivacaine and d/c epidural catheter. Will have foley d/ced 2 hours after epidural bolus.  Kipp Broodavid Mona Ayars

## 2017-07-04 NOTE — Progress Notes (Signed)
Subjective: 1 Day Post-Op Procedure(s) (LRB): TOTAL KNEE BILATERAL (Bilateral)   Patient seen today with Dr. Noreene LarssonJoslin present. The plan is to bolus her epidural and then remove it. Patient states she is having pain but does not like the way the pain medicine is making her feel. Her wish is to go home but she wants to make sure that she is able to get up and move well.  Activity level:  WBAT Diet tolerance: ok  Voiding:  Foley in place Patient reports pain as moderate.    Objective: Vital signs in last 24 hours: Temp:  [97.5 F (36.4 C)-98.8 F (37.1 C)] 98.8 F (37.1 C) (01/16 0300) Pulse Rate:  [48-79] 70 (01/16 0300) Resp:  [12-22] 16 (01/16 0300) BP: (93-134)/(56-87) 111/56 (01/16 0300) SpO2:  [98 %-100 %] 98 % (01/16 0300)  Labs: No results for input(s): HGB in the last 72 hours. No results for input(s): WBC, RBC, HCT, PLT in the last 72 hours. No results for input(s): NA, K, CL, CO2, BUN, CREATININE, GLUCOSE, CALCIUM in the last 72 hours. No results for input(s): LABPT, INR in the last 72 hours.  Physical Exam:  Neurologically intact ABD soft Neurovascular intact Sensation intact distally Intact pulses distally Dorsiflexion/Plantar flexion intact Incision: dressing C/D/I and no drainage No cellulitis present Compartment soft  Assessment/Plan:  1 Day Post-Op Procedure(s) (LRB): TOTAL KNEE BILATERAL (Bilateral) Advance diet Up with therapy Discharge home with home health once doing well and cleared by PT. We greatly appreciate Dr. Morley KosJoslin's assistance in managing the pain and epidural. We will Start the 325mg  ASA BID this morning.  Fllow up in office 2 weeks post op. I will increase robaxin to 750mg  to help with pain control.  Ludia Gartland, Ginger OrganNDREW PAUL 07/04/2017, 7:42 AM

## 2017-07-04 NOTE — Progress Notes (Signed)
Anesthesiology Note:   8  cc 0.2% Ropivacaine given as bolus through epidural catheter. Catheter d/ced, site OK tip intact. Will have foley d/ced in 2 hours.  Samantha Curtis

## 2017-07-04 NOTE — Progress Notes (Signed)
Patient refused to have foley removed 07/04/17 at 2000 informed her that Md wanted foley removed 2 hrs after the epidural was removed. She stated that she wants it to remain tonight and that tomorrow she will allow me to take it out. This is due to her pain. Ilean SkillVeronica Colten Desroches LPN

## 2017-07-04 NOTE — Evaluation (Signed)
Occupational Therapy Evaluation Patient Details Name: Samantha Curtis MRN: [id number] DOB: [birth year] Today's Date: 07/04/2017    History of Present Illness 52yo female s/p B TKRs done 07/03/17. PMH dysrhythmia, GERD, hx R knee and R shoulder arthroscopies    Clinical Impression   Patient is s/p B TKR surgery resulting in functional limitations due to the deficits listed below (see OT problem list). Pt currently requires (A) for LB total (A) and mod (A) for basic transfers. Pt noted to be drowzy this session reporting 9 out10 pain but with decr visual attention to task.  Patient will benefit from skilled OT acutely to increase independence and safety with ADLS to allow discharge CIR.  *of note: follow employee visiting and making request on patients behalf during session that are not appropriate for patient ie : requesting the KI be removed which is not appropriate until pt is cleared by PT and demonstrates quad activation. Also requesting that patient have CPM delivered to room. Spouse educated that this would be ordered by the MD. Pt states "he doesn't want me to have it here"     Follow Up Recommendations  CIR    Equipment Recommendations  3 in 1 bedside commode    Recommendations for Other Services Rehab consult     Precautions / Restrictions Precautions Precautions: Fall;Knee Precaution Comments: B knee immobilizers  Required Braces or Orthoses: Knee Immobilizer - Right;Knee Immobilizer - Left Knee Immobilizer - Right: On except when in CPM Knee Immobilizer - Left: On except when in CPM Restrictions Weight Bearing Restrictions: Yes RLE Weight Bearing: Weight bearing as tolerated LLE Weight Bearing: Weight bearing as tolerated      Mobility Bed Mobility Overal bed mobility: Needs Assistance Bed Mobility: Sit to Supine     Supine to sit: Mod assist Sit to supine: Mod assist   General bed mobility comments: pt requires (A) to elevated bil LE into the bed and to pivot  buttock. pt with bed tilted with HOB down to allow patient to use BIL UE to pull herself toward [facility] for positioning  Transfers Overall transfer level: Needs assistance Equipment used: Rolling walker (2 wheeled) Transfers: Sit to/from Stand Sit to Stand: Mod assist Stand pivot transfers: Min assist       General transfer comment: pt with lOB x2 attempting to transfer from chair to bed. pt holding onto RW and screaming "I am falling" pt reassured that she is not falling at this time. Pt able to take several steps toward bed and two steps to [facility]    Balance Overall balance assessment: Needs assistance Sitting-balance support: Bilateral upper extremity supported;Feet supported Sitting balance-[name]: Good     Standing balance support: Bilateral upper extremity supported Standing balance-[name]: Poor Standing balance comment: relies heavily on RW                           ADL either performed or assessed with clinical judgement   ADL Overall ADL's : Needs assistance/impaired Eating/Feeding: Set up;Sitting Eating/Feeding Details (indicate cue type and reason): family reports poor PO intake since surg Grooming: Set up   Upper Body Bathing: Set up   Lower Body Bathing: Maximal assistance           Toilet Transfer: Maximal assistance             General ADL Comments: pt in chair on arrival and requesting back to bed per patient for >1 hour. OT educated on purpose  and reason for consult. Ot assisting patient back to bed and explaining how this is practice to prepare for the d/c of the foley.      Vision   Vision Assessment?: No apparent visual deficits     Perception     Praxis      Pertinent Vitals/Pain Pain Assessment: 0-10 Pain Score: 9  Pain Location: B knees, R>L  Pain Descriptors / Indicators: Aching;Sore;Guarding;Operative site guarding Pain Intervention(s): Monitored during session;Premedicated before session;Repositioned;Ice  applied;Limited activity within patient's tolerance     Hand Dominance Right   Extremity/Trunk Assessment Upper Extremity Assessment Upper Extremity Assessment: Overall WFL for tasks assessed   Lower Extremity Assessment Lower Extremity Assessment: Defer to PT evaluation   Cervical / Trunk Assessment Cervical / Trunk Assessment: Normal   Communication Communication Communication: No difficulties   Cognition Arousal/Alertness: Lethargic;Suspect due to medications Behavior During Therapy: [facility] for tasks assessed/performed Overall Cognitive Status: Within Functional Limits for tasks assessed                                 General Comments: pt with eyes partically closed and reports pain but does not cry or scream or cry out like noted this AM. pt currentl appears to have pain in a tolerance range to participate   General Comments  ice on bil knees [facility]    Exercises     Shoulder Instructions      Home Living Family/patient expects to be discharged to:: Private residence Living Arrangements: Spouse/significant other Available Help at Discharge: Family Type of Home: House Home Access: Stairs to enter Secretary/administratorntrance Stairs-Number of Steps: 1 with railing  Entrance Stairs-Rails: Left Home Layout: Two level Alternate Level Stairs-Number of Steps: 20 Alternate Level Stairs-Rails: Left Bathroom Shower/Tub: Producer, television/film/videoWalk-in shower   Bathroom Toilet: Standard     Home Equipment: Environmental consultantWalker - 2 wheels;Shower seat - built in   Additional Comments: works for [employer]. pt has fellow employee enternig the room throughout session. Spouse is present at evaluation. Staff friends making recommendations to patient during session that are not appropriate to her person needs example : WHy does she have those black things I didnt have those. You dont need those! She doesnt have to have those does she?      Prior Functioning/Environment Level of Independence: Independent                  OT Problem List: Decreased strength;Decreased activity tolerance;Impaired balance (sitting and/or standing);Decreased safety awareness;Decreased knowledge of precautions;Decreased knowledge of use of DME or AE;Pain      OT Treatment/Interventions: Self-care/ADL training;Therapeutic exercise;DME and/or AE instruction;Therapeutic activities;Patient/family education;Balance training    OT Goals(Current goals can be found in the care plan section) Acute Rehab OT Goals Patient Stated Goal: to get back in the bed and wash later OT Goal Formulation: With patient/family Time For Goal Achievement: 07/18/17 Potential to Achieve Goals: Good  OT Frequency: Min 2X/week   Barriers to D/C:            Co-evaluation              AM-PAC PT "6 Clicks" Daily Activity     Outcome Measure Help from another person eating meals?: A Little Help from another person taking care of personal grooming?: A Lot Help from another person toileting, which includes using toliet, bedpan, or urinal?: A Lot Help from another person bathing (including washing, rinsing, drying)?: A  Lot Help from another person to put on and taking off regular upper body clothing?: A Lot Help from another person to put on and taking off regular lower body clothing?: A Lot 6 Click Score: 13   End of Session Equipment Utilized During Treatment: Gait belt;Rolling walker;Right knee immobilizer;Left knee immobilizer Nurse Communication: Mobility status;Precautions;Weight bearing status  Activity Tolerance: Patient tolerated treatment well Patient left: in bed;with call bell/phone within reach;with family/visitor present  OT Visit Diagnosis: Unsteadiness on feet (R26.81);Pain Pain - Right/Left: Right Pain - part of body: Leg                Time: 1411-1432 OT Time Calculation (min): 21 min Charges:  OT General Charges $OT Visit: 1 Visit OT Evaluation $OT Eval Moderate Complexity: 1 Mod G-Codes:      Mateo Flow    OTR/L Pager: [phone] Office: [phone] .   Boone Master B 07/04/2017, 3:17 PM

## 2017-07-04 NOTE — Progress Notes (Signed)
I will follow up tomorrow with pt and family to assist with planning her dispo pending her wishes and functional mobility. [phone]

## 2017-07-05 NOTE — Progress Notes (Signed)
Physical Therapy Treatment Patient Details Name: Samantha Curtis MRN: [id number] DOB: 01/02/1966 Today's Date: 07/05/2017    History of Present Illness 51yo female s/p B TKRs done 07/03/17. PMH dysrhythmia, GERD, hx R knee and R shoulder arthroscopies     PT Comments    Patient received in bed, much more alert this morning and remaining very pleasant and motivated to participate with PT. Note ROM continues to be pain limited, with L knee AROM 19 degrees extension to 31 degrees flexion, R knee AROM 19 degrees extension to 37 degrees flexion in supine. Performed functional total joint exercises, then applied B knee immobilizers and transferred to edge of bed. She continues to require Mod assist for bed mobility and all functional transfers, and was able to gait train approximately 5ft to the chair today however gait does continue to be limited by pain. Note posterior LOB during gait today especially on turns requiring MinA to correct however gait fluidity is improving. She was left up in the chair with nurse tech present and attending, all needs otherwise met.     Follow Up Recommendations  DC plan and follow up therapy as arranged by surgeon;CIR(would benefit from CIR )     Equipment Recommendations  Rolling walker with 5" wheels;3in1 (PT)    Recommendations for Other Services       Precautions / Restrictions Precautions Precautions: Fall;Knee Precaution Comments: B knee immobilizers  Required Braces or Orthoses: Knee Immobilizer - Right;Knee Immobilizer - Left Knee Immobilizer - Right: On except when in CPM Knee Immobilizer - Left: On except when in CPM Restrictions Weight Bearing Restrictions: Yes RLE Weight Bearing: Weight bearing as tolerated LLE Weight Bearing: Weight bearing as tolerated    Mobility  Bed Mobility Overal bed mobility: Needs Assistance Bed Mobility: Supine to Sit     Supine to sit: Mod assist     General bed mobility comments: assist to bring legs around to  EOB   Transfers Overall transfer level: Needs assistance Equipment used: Rolling walker (2 wheeled) Transfers: Sit to/from Stand Sit to Stand: Mod assist         General transfer comment: assist to get to feet and ensure balance during standing with B knee immobilizers applied   Ambulation/Gait Ambulation/Gait assistance: Min assist Ambulation Distance (Feet): 5 Feet Assistive device: Rolling walker (2 wheeled) Gait Pattern/deviations: Step-through pattern;Decreased step length - right;Decreased step length - left;Decreased dorsiflexion - right;Decreased dorsiflexion - left;Antalgic;Trunk flexed     General Gait Details: gait trained approximately 5ft to chair, continues to be limited by pain B knees, also note posterior LOB requiring MinA to correct    Stairs            Wheelchair Mobility    Modified Rankin (Stroke Patients Only)       Balance Overall balance assessment: Needs assistance Sitting-balance support: Bilateral upper extremity supported;Feet supported Sitting balance-[name]: Good     Standing balance support: Bilateral upper extremity supported;During functional activity Standing balance-[name]: Poor Standing balance comment: relies heavily on RW, posterior LOB today                             Cognition Arousal/Alertness: Awake/alert Behavior During Therapy: WFL for tasks assessed/performed Overall Cognitive Status: Within Functional Limits for tasks assessed                                   General Comments: paitent much more alert this morning       Exercises Total Joint Exercises Quad Sets: Both;10 reps;Supine Short Arc Quad: Right;10 reps;Supine(attempted L LE, unable to tolerate knee pain with flexoin during exercise L ) Heel Slides: Both;10 reps;Supine Straight Leg Raises: Both;10 reps;Supine Goniometric ROM: L knee AROM 19 degrees extension 31 degrees flexion limited by pain with flexion; R knee AROM  19 degrees extension 37 degrees flexion in supine     General Comments General comments (skin integrity, edema, etc.): more alert today but remains pain limited, very pleasant and motivated to participate in PT       Pertinent Vitals/Pain Pain Assessment: 0-10 Pain Score: 6  Pain Location: B knees, R>L  Pain Descriptors / Indicators: Aching;Sore;Guarding;Operative site guarding Pain Intervention(s): Limited activity within patient's tolerance;Monitored during session;Repositioned    Home Living                      Prior Function            PT Goals (current goals can now be found in the care plan section) Acute Rehab PT Goals Patient Stated Goal: to get better/go home as independently as possible  PT Goal Formulation: With patient Time For Goal Achievement: 07/18/17 Potential to Achieve Goals: Good Progress towards PT goals: Progressing toward goals    Frequency    7X/week      PT Plan Current plan remains appropriate    Co-evaluation              AM-PAC PT "6 Clicks" Daily Activity  Outcome Measure  Difficulty turning over in bed (including adjusting bedclothes, sheets and blankets)?: Unable Difficulty moving from lying on back to sitting on the side of the bed? : Unable Difficulty sitting down on and standing up from a chair with arms (e.g., wheelchair, bedside commode, etc,.)?: Unable Help needed moving to and from a bed to chair (including a wheelchair)?: A Lot Help needed walking in hospital room?: A Lot Help needed climbing 3-5 steps with a railing? : Total 6 Click Score: 8    End of Session Equipment Utilized During Treatment: Gait belt;Left knee immobilizer;Right knee immobilizer Activity Tolerance: Patient limited by pain Patient left: in chair;with call bell/phone within reach;with nursing/sitter in room   PT Visit Diagnosis: Unsteadiness on feet (R26.81);Muscle weakness (generalized) (M62.81);Difficulty in walking, not elsewhere  classified (R26.2);Pain Pain - Right/Left: (bilateral knees ) Pain - part of body: Knee     Time: 0832-0910 PT Time Calculation (min) (ACUTE ONLY): 38 min  Charges:  $Gait Training: 8-22 mins $Therapeutic Exercise: 8-22 mins $Self Care/Home Management: 8-22                    G Codes:       Kristen Unger PT, DPT, CBIS  Supplemental Physical Therapist [place]   Pager [phone]    

## 2017-07-05 NOTE — Progress Notes (Signed)
I met with pt at bedside to discuss a possible inpt rehab admit pending insurance approval. She would like to pursue admit for 5 to 7 days pending insurance approval. I will begin authorization for a possible admit Friday. POD # 2 today. [phone]

## 2017-07-05 NOTE — Addendum Note (Signed)
Addendum  created 07/05/17 2022 by Kipp BroodJoslin, Gala Padovano, MD   Order list changed

## 2017-07-05 NOTE — Progress Notes (Signed)
Physical Therapy Treatment Patient Details Name: Samantha Curtis MRN: [id number] DOB: [birth year] Today's Date: 07/05/2017    History of Present Illness 52yo female s/p B TKRs done 07/03/17. PMH dysrhythmia, GERD, hx R knee and R shoulder arthroscopies     PT Comments    Patient received in bed with husband present, pleasant and willing to participate in PT session this afternoon. She continues to require Mod assist for bed mobility and functional transfers, however was able to gait train approximately 14f today (husband present and assisted with chair follow) with significant antalgic pattern and multiple standing rest breaks, cues for upright posture and heel-toe pattern provided. Education provided regarding differences between Acute PT for TKRs and CIR, benefit of CIR in returning to independence following surgery. She was left in bed with all needs met and husband present this afternoon.    Follow Up Recommendations  CIR;DC plan and follow up therapy as arranged by surgeon     Equipment Recommendations  Rolling walker with 5" wheels;3in1 (PT)    Recommendations for Other Services       Precautions / Restrictions Precautions Precautions: Fall;Knee Precaution Comments: B knee immobilizers  Required Braces or Orthoses: Knee Immobilizer - Right;Knee Immobilizer - Left Knee Immobilizer - Right: On except when in CPM Knee Immobilizer - Left: On except when in CPM Restrictions Weight Bearing Restrictions: Yes RLE Weight Bearing: Weight bearing as tolerated LLE Weight Bearing: Weight bearing as tolerated    Mobility  Bed Mobility Overal bed mobility: Needs Assistance Bed Mobility: Supine to Sit;Sit to Supine     Supine to sit: Mod assist Sit to supine: Mod assist   General bed mobility comments: LE management, VC for sequencing/safety   Transfers Overall transfer level: Needs assistance Equipment used: Rolling walker (2 wheeled) Transfers: Sit to/from Stand Sit to Stand:  Mod assist Stand pivot transfers: Min assist       General transfer comment: assist to steady and come to feet with B knee immobilizers on   Ambulation/Gait Ambulation/Gait assistance: Min guard Ambulation Distance (Feet): 50 Feet Assistive device: Rolling walker (2 wheeled) Gait Pattern/deviations: Step-through pattern;Decreased step length - right;Decreased step length - left;Decreased dorsiflexion - right;Decreased dorsiflexion - left;Antalgic;Trunk flexed     General Gait Details: focused session on gait training approximately 566ftoday, antalgic pattern and multiple standing rest breaks ; husband present during session and assisted with chair follow during gait    Stairs            Wheelchair Mobility    Modified Rankin (Stroke Patients Only)       Balance Overall balance assessment: Needs assistance Sitting-balance support: Bilateral upper extremity supported;Feet supported Sitting balance-[name]: Good     Standing balance support: Bilateral upper extremity supported;During functional activity Standing balance-[name]: Fair Standing balance comment: reliant on RW but able to maintain balance for a couple seconds with no UE support inside of walker                             Cognition Arousal/Alertness: Awake/alert Behavior During Therapy: WFL for tasks assessed/performed Overall Cognitive Status: Within Functional Limits for tasks assessed                                        Exercises      General Comments General comments (skin integrity, edema, etc.):  pain limited but remains very pleasant and motivated to participate       Pertinent Vitals/Pain Pain Assessment: 0-10 Pain Score: 6  Faces Pain Scale: Hurts whole lot Pain Location: B knees, R>L  Pain Descriptors / Indicators: Sore;Guarding;Operative site guarding;Cramping Pain Intervention(s): Limited activity within patient's tolerance;Monitored during  session;Repositioned;Premedicated before session    Home Living                      Prior Function            PT Goals (current goals can now be found in the care plan section) Acute Rehab PT Goals Patient Stated Goal: to get better/go home as independently as possible  PT Goal Formulation: With patient Time For Goal Achievement: 07/18/17 Potential to Achieve Goals: Good Progress towards PT goals: Progressing toward goals    Frequency    7X/week      PT Plan Current plan remains appropriate    Co-evaluation              AM-PAC PT "6 Clicks" Daily Activity  Outcome Measure  Difficulty turning over in bed (including adjusting bedclothes, sheets and blankets)?: Unable Difficulty moving from lying on back to sitting on the side of the bed? : Unable Difficulty sitting down on and standing up from a chair with arms (e.g., wheelchair, bedside commode, etc,.)?: Unable Help needed moving to and from a bed to chair (including a wheelchair)?: A Lot Help needed walking in hospital room?: A Little Help needed climbing 3-5 steps with a railing? : Total 6 Click Score: 9    End of Session Equipment Utilized During Treatment: Gait belt;Left knee immobilizer;Right knee immobilizer Activity Tolerance: Patient limited by pain Patient left: in chair;with call bell/phone within reach;with nursing/sitter in room   PT Visit Diagnosis: Unsteadiness on feet (R26.81);Muscle weakness (generalized) (M62.81);Difficulty in walking, not elsewhere classified (R26.2);Pain Pain - Right/Left: (bilateral knees ) Pain - part of body: Knee     Time: 3267-1245 PT Time Calculation (min) (ACUTE ONLY): 28 min  Charges:  $Gait Training: 8-22 mins $Therapeutic Activity: 8-22 mins                    G Codes:       Deniece Ree PT, DPT, CBIS  Supplemental Physical Therapist [place]   Pager [phone]

## 2017-07-05 NOTE — Progress Notes (Signed)
Occupational Therapy Treatment Patient Details Name: Samantha Curtis MRN: [id number] DOB: 04-03-1966 Today's Date: 07/05/2017    History of present illness 52yo female s/p B TKRs done 07/03/17. PMH dysrhythmia, GERD, hx R knee and R shoulder arthroscopies    OT comments  Pt progressing towards acute OT goals. Focus of session was toilet transfer/pericare, in-room functional mobility, and bed mobility. Pt grimacing and reporting cramping pain throughout session but motivated to participate in therapy. Family present and involved during session. D/c plan to CIR remains appropriate from OT standpoint.   Follow Up Recommendations  CIR    Equipment Recommendations  3 in 1 bedside commode    Recommendations for Other Services Rehab consult    Precautions / Restrictions Precautions Precautions: Fall;Knee Precaution Comments: B knee immobilizers  Required Braces or Orthoses: Knee Immobilizer - Right;Knee Immobilizer - Left Knee Immobilizer - Right: On except when in CPM Knee Immobilizer - Left: On except when in CPM Restrictions Weight Bearing Restrictions: Yes RLE Weight Bearing: Weight bearing as tolerated LLE Weight Bearing: Weight bearing as tolerated       Mobility Bed Mobility Overal bed mobility: Needs Assistance Bed Mobility: Sit to Supine     Supine to sit: Mod assist Sit to supine: Mod assist   General bed mobility comments: assist to bring legs around to EOB. good ability to bridge hips. Posterior LOB on initial stand from recliner.   Transfers Overall transfer level: Needs assistance Equipment used: Rolling walker (2 wheeled) Transfers: Sit to/from Stand Sit to Stand: Mod assist Stand pivot transfers: Min assist       General transfer comment: steadying assist, LOB posteriorly on initial stand from EOB.     Balance Overall balance assessment: Needs assistance Sitting-balance support: Bilateral upper extremity supported;Feet supported Sitting balance-Leahy  Scale: Good     Standing balance support: Bilateral upper extremity supported;During functional activity Standing balance-[name]: Poor Standing balance comment: relies heavily on RW, posterior LOB today                            ADL either performed or assessed with clinical judgement   ADL Overall ADL's : Needs assistance/impaired                         Toilet Transfer: Moderate assistance;Stand-pivot;BSC;RW   Toileting- Clothing Manipulation and Hygiene: Maximal assistance;Sit to/from stand Toileting - Clothing Manipulation Details (indicate cue type and reason): steadying assist for balance in standing, total to complete pericare     Functional mobility during ADLs: Minimal assistance;Rolling walker;Moderate assistance General ADL Comments: Pt completed SPT to BSC, pericare, and ambulated to opposite side of bed. Family member present and involved throughout.      Vision       Perception     Praxis      Cognition Arousal/Alertness: Awake/alert Behavior During Therapy: WFL for tasks assessed/performed Overall Cognitive Status: Within Functional Limits for tasks assessed                                 General Comments: paitent much more alert this morning         Exercises Total Joint Exercises Quad Sets: Both;10 reps;Supine Short Arc Quad: Right;10 reps;Supine(attempted L LE, unable to tolerate knee pain with flexoin during exercise L ) Heel Slides: Both;10 reps;Supine Straight Leg Raises: Both;10 reps;Supine Goniometric ROM: L  knee AROM 19 degrees extension 31 degrees flexion limited by pain with flexion; R knee AROM 19 degrees extension 37 degrees flexion in supine    Shoulder Instructions       General Comments Grimacing and reporting high pain throughout session but motivated to participate in therapy.     Pertinent Vitals/ Pain       Pain Assessment: Faces Pain Score: 6  Faces Pain Scale: Hurts whole lot Pain  Location: B knees, R>L  Pain Descriptors / Indicators: Sore;Guarding;Operative site guarding;Cramping Pain Intervention(s): Limited activity within patient's tolerance;Monitored during session;Repositioned  Home Living                                          Prior Functioning/Environment              Frequency  Min 2X/week        Progress Toward Goals  OT Goals(current goals can now be found in the care plan section)  Progress towards OT goals: Progressing toward goals  Acute Rehab OT Goals Patient Stated Goal: to get better/go home as independently as possible  OT Goal Formulation: With patient/family Time For Goal Achievement: 07/18/17 Potential to Achieve Goals: Good  Plan Discharge plan remains appropriate    Co-evaluation                 AM-PAC PT "6 Clicks" Daily Activity     Outcome Measure   Help from another person eating meals?: A Little Help from another person taking care of personal grooming?: A Lot Help from another person toileting, which includes using toliet, bedpan, or urinal?: A Lot Help from another person bathing (including washing, rinsing, drying)?: A Lot Help from another person to put on and taking off regular upper body clothing?: A Lot Help from another person to put on and taking off regular lower body clothing?: A Lot 6 Click Score: 13    End of Session Equipment Utilized During Treatment: Gait belt;Rolling walker;Right knee immobilizer;Left knee immobilizer  OT Visit Diagnosis: Unsteadiness on feet (R26.81);Pain Pain - Right/Left: Right Pain - part of body: Leg   Activity Tolerance Patient tolerated treatment well   Patient Left in bed;with call bell/phone within reach;with family/visitor present   Nurse Communication Other (comment)(cramping pain in RLE>LLE)        Time: [id number] OT Time Calculation (min): 24 min  Charges: OT General Charges $OT Visit: 1 Visit OT Treatments $Self Care/Home  Management : 23-37 mins     Pilar GrammesMathews, Angelita Harnack H 07/05/2017, 1:05 PM

## 2017-07-05 NOTE — Progress Notes (Signed)
Subjective: 2 Days Post-Op Procedure(s) (LRB): TOTAL KNEE BILATERAL (Bilateral)   Patient feels a little better this morning. Pain control still difficult but she does nto want anything that will make her feel sleepy or loopy.  Activity level:  wbat Diet tolerance:  ok Voiding:  ok Patient reports pain as moderate.    Objective: Vital signs in last 24 hours: Temp:  [98.5 F (36.9 C)-99.1 F (37.3 C)] 99.1 F (37.3 C) (01/17 0500) Pulse Rate:  [79-97] 97 (01/17 0500) Resp:  [16-18] 16 (01/17 0500) BP: (128-161)/(66-78) 133/76 (01/17 0500) SpO2:  [97 %-98 %] 98 % (01/17 0500)  Labs: No results for input(s): HGB in the last 72 hours. No results for input(s): WBC, RBC, HCT, PLT in the last 72 hours. No results for input(s): NA, K, CL, CO2, BUN, CREATININE, GLUCOSE, CALCIUM in the last 72 hours. No results for input(s): LABPT, INR in the last 72 hours.  Physical Exam:  Neurologically intact ABD soft Neurovascular intact Sensation intact distally Intact pulses distally Dorsiflexion/Plantar flexion intact Incision: dressing C/D/I and no drainage No cellulitis present Compartment soft  Assessment/Plan:  2 Days Post-Op Procedure(s) (LRB): TOTAL KNEE BILATERAL (Bilateral) Advance diet Up with therapy  We will decided on discharge pending how she does with PT. It will either be home with HHPT or potentially CIR. I removed ACE wraps today which feels a little better. Follow up in office 2 weeks post op. Continue on ASA 325mg  BID for DVT prevention  Kaytlynne Neace, Ginger OrganNDREW PAUL 07/05/2017, 6:57 AM

## 2017-07-06 LAB — BPAM RBC
Blood Product Expiration Date: 201901282359
Blood Product Expiration Date: 201901282359
UNIT TYPE AND RH: 6200
Unit Type and Rh: 6200

## 2017-07-06 LAB — TYPE AND SCREEN
ABO/RH(D): AB POS
Antibody Screen: NEGATIVE
Unit division: 0
Unit division: 0

## 2017-07-06 MED ORDER — OXYCODONE-ACETAMINOPHEN 5-325 MG PO TABS: 1.0000 | ORAL_TABLET | ORAL | 0 refills | Status: DC | PRN

## 2017-07-06 MED ORDER — TIZANIDINE HCL 4 MG PO TABS: 4.0000 mg | ORAL_TABLET | Freq: Four times a day (QID) | ORAL | 1 refills | Status: AC | PRN

## 2017-07-06 MED ORDER — DOCUSATE SODIUM 100 MG PO CAPS: 100.0000 mg | ORAL_CAPSULE | Freq: Two times a day (BID) | ORAL | 0 refills | Status: DC

## 2017-07-06 MED ORDER — BISACODYL 5 MG PO TBEC: 5.0000 mg | DELAYED_RELEASE_TABLET | Freq: Every day | ORAL | 0 refills | Status: DC | PRN

## 2017-07-06 MED ORDER — ASPIRIN 325 MG PO TBEC: 325.0000 mg | DELAYED_RELEASE_TABLET | Freq: Two times a day (BID) | ORAL | 0 refills | Status: DC

## 2017-07-06 NOTE — Progress Notes (Signed)
Physical Therapy Treatment Patient Details Name: Samantha Curtis MRN: [id number] DOB: Dec 19, 1965 Today's Date: 07/06/2017    History of Present Illness 51yo female s/p B TKRs done 07/03/17. PMH dysrhythmia, GERD, hx R knee and R shoulder arthroscopies.     PT Comments    AM treatment session with large emphasis on exercises, joint ROM, and independence with HEP. Pt continues to remain very limited in ROM, Rt knee flexion P/ROM: 17-71 degrees; Lt knee flexion P/ROM: 22-60 degrees. Let knee is significantly painful during all activity througout session. Overall quads activation remains very poor, patient unable to demonstrate activation in quads set, and requires mod-total assist to perform SAQ in limited range. Pt has most success with isometric hold in TKE during quad set, but only on the right side and still requires greater than 50% of the effort be performed by the therapist. Pt is not yet independent with HEP, only performing ankle pumps indep. Pt's bed mobility and transfers are performed at supervision level this session. AMB in room limited by time (30ft) but will be more heavily pursued in PM session today. Pt reports this date that she is refusing recommendations for DC to CIR, but will elect to DC to home: this is strongly discouraged at this time from PT standpoint, d/t concerns over entering the home with 1 step, lack of independence with HEP, and no AMB >32ft since procedure.    Follow Up Recommendations  CIR;DC plan and follow up therapy as arranged by surgeon(pt has refused CIR, now plans to DC home)     Equipment Recommendations  Rolling walker with 5" wheels;3in1 (PT)    Recommendations for Other Services       Precautions / Restrictions Precautions Precautions: Fall;Knee Precaution Comments: B knee immobilizers  Required Braces or Orthoses: Knee Immobilizer - Right;Knee Immobilizer - Left Knee Immobilizer - Right: On at all times Knee Immobilizer - Left: On at all  times Restrictions RLE Weight Bearing: Weight bearing as tolerated LLE Weight Bearing: Weight bearing as tolerated    Mobility  Bed Mobility Overal bed mobility: Needs Assistance Bed Mobility: Supine to Sit     Supine to sit: HOB elevated;Supervision        Transfers Overall transfer level: Needs assistance Equipment used: Rolling walker (2 wheeled) Transfers: Sit to/from Stand Sit to Stand: Supervision         General transfer comment: assist to steady and come to feet with B knee immobilizers on   Ambulation/Gait   Ambulation Distance (Feet): 24 Feet Assistive device: Rolling walker (2 wheeled) Gait Pattern/deviations: Antalgic     General Gait Details: wide based gait, flexed trunk, three-point gait with RW.    Stairs            Wheelchair Mobility    Modified Rankin (Stroke Patients Only)       Balance Overall balance assessment: Needs assistance Sitting-balance support: Feet supported;No upper extremity supported Sitting balance-[name]: Good     Standing balance support: Bilateral upper extremity supported;During functional activity Standing balance-[name]: Fair                              Cognition Arousal/Alertness: Awake/alert Behavior During Therapy: WFL for tasks assessed/performed Overall Cognitive Status: Within Functional Limits for tasks assessed  General Comments: paitent much more alert this morning       Exercises Total Joint Exercises Ankle Circles/Pumps: AROM;10 reps;Both;Supine Quad Sets: (attempted, but patient unable to perform; gluteal set only upon attempt) Short Arc Quad: Both;10 reps;Supine;PROM(largely unsuccessful d/t poor quads activation, tolerates gentle P/ROM; able to perform isometric hold in TKE with modA right, maxA left) Heel Slides: Both;Supine;15 reps;PROM(req total Assist to perform ) Hip ABduction/ADduction: AAROM;Supine;10  reps Straight Leg Raises: (no appropriate d/t significant ROM deficits and poor quads activation.) Goniometric ROM: Rt knee flexion P/ROM: 17-71 degrees; Lt knee flexion P/ROM: 22-60 degrees.    General Comments        Pertinent Vitals/Pain Pain Assessment: 0-10 Pain Score: 8  Pain Location: 5/10 at rest increasing 8/10 with ROM, bed exercises Pain Descriptors / Indicators: Sore;Guarding;Operative site guarding;Cramping Pain Intervention(s): Limited activity within patient's tolerance;Monitored during session;Repositioned    Home Living                      Prior Function            PT Goals (current goals can now be found in the care plan section) Acute Rehab PT Goals Patient Stated Goal: to get better/go home as independently as possible  PT Goal Formulation: With patient Time For Goal Achievement: 07/18/17 Potential to Achieve Goals: Fair Progress towards PT goals: Progressing toward goals    Frequency    7X/week      PT Plan Current plan remains appropriate    Co-evaluation              AM-PAC PT "6 Clicks" Daily Activity  Outcome Measure  Difficulty turning over in bed (including adjusting bedclothes, sheets and blankets)?: A Lot Difficulty moving from lying on back to sitting on the side of the bed? : A Lot Difficulty sitting down on and standing up from a chair with arms (e.g., wheelchair, bedside commode, etc,.)?: A Little Help needed moving to and from a bed to chair (including a wheelchair)?: A Little Help needed walking in hospital room?: A Little Help needed climbing 3-5 steps with a railing? : Total 6 Click Score: 14    End of Session Equipment Utilized During Treatment: Gait belt;Left knee immobilizer;Right knee immobilizer Activity Tolerance: Patient limited by pain;Patient tolerated treatment well;Patient limited by fatigue Patient left: in chair;with call bell/phone within reach;with nursing/sitter in room(heel floated for knee  extension stretch) Nurse Communication: Mobility status PT Visit Diagnosis: Unsteadiness on feet (R26.81);Muscle weakness (generalized) (M62.81);Difficulty in walking, not elsewhere classified (R26.2);Pain Pain - Right/Left: (bilateral) Pain - part of body: Knee     Time: [id number] PT Time Calculation (min) (ACUTE ONLY): 46 min  Charges:  $Therapeutic Exercise: 23-37 mins $Therapeutic Activity: 8-22 mins                    G Codes:       9:59 AM, 07/06/17 Samantha LintsAllan C Malyia Moro, PT, DPT [facility] Physical Therapist - [place] [phone] (Pager)  [phone] (Mobile)  [phone] (Office)      Samantha Curtis C 07/06/2017, 9:53 AM

## 2017-07-06 NOTE — Progress Notes (Signed)
Physical Therapy Treatment Patient Details Name: Samantha Curtis MRN: [id number] DOB: 1966-03-22 Today's Date: 07/06/2017    History of Present Illness 51yo female s/p B TKRs done 07/03/17. PMH dysrhythmia, GERD, hx R knee and R shoulder arthroscopies.     PT Comments    Pt received up in chair, reports she has been here since AM PT session, and has performed some stretching with husband since then, but Curtis/o intermittent muscle spasm. Pt remains very limited in tolerable ROM, with high level of pain associated with ROM, particularly in the Left knee. Pt progresses AMB this session to 67ft @ 0.75m/s, but distance is limited to allow tolerance for stair training to prepare for DC home. Stairs prove difficult, requiring maximal effort and stabilization of patient by PT and RW from husband (+2), but ultimately patient is able to perform to simulate entry of home. Patient making progress in independence with transfers and bed mobility, but AMB remains limited by weakness, pain, and mechanics dictate by knee immobilizers. PT still recommending a higher level of care at DC, CIR/STR.    Follow Up Recommendations  CIR;DC plan and follow up therapy as arranged by surgeon(Pt refusing to CIR and electing to DC home with HHPT)     Equipment Recommendations  Rolling walker with 5" wheels;3in1 (PT)    Recommendations for Other Services       Precautions / Restrictions Precautions Precautions: Fall;Knee Precaution Comments: B knee immobilizers  Required Braces or Orthoses: Knee Immobilizer - Right;Knee Immobilizer - Left Knee Immobilizer - Right: On at all times Knee Immobilizer - Left: On at all times Restrictions RLE Weight Bearing: Weight bearing as tolerated LLE Weight Bearing: Weight bearing as tolerated    Mobility  Bed Mobility Overal bed mobility: Needs Assistance Bed Mobility: Sit to Supine       Sit to supine: Min assist   General bed mobility comments: assistance with BLE in KI;  Trunk control and BUE strength very good.   Transfers Overall transfer level: Needs assistance Equipment used: Rolling walker (2 wheeled) Transfers: Sit to/from Stand Sit to Stand: Supervision;From elevated surface(performed 6x in session from 3 different surfaces)            Ambulation/Gait Ambulation/Gait assistance: Supervision Ambulation Distance (Feet): 50 Feet Assistive device: Rolling walker (2 wheeled) Gait Pattern/deviations: Antalgic     General Gait Details: wide based gait, flexed trunk, three-point gait with RW; VC for equalizing step length bilat (pt reluctant to FWB on LLE for bigger Rt step)    Stairs Stairs: Yes   Stair Management: No rails;With walker;Backwards Number of Stairs: 1(2x1 step; minA first time, and supervision for 2nd attempts. Husband in attendance. )    Wheelchair Mobility    Modified Rankin (Stroke Patients Only)       Balance                                            Cognition Arousal/Alertness: Awake/alert Behavior During Therapy: WFL for tasks assessed/performed Overall Cognitive Status: Within Functional Limits for tasks assessed                                        Exercises Total Joint Exercises Long Arc Quad: PROM;Both;10 reps(prior to AMB to decrease stiffness) Knee Flexion: PROM;Both;10 reps(prior to  AMB to decrease stiffness)    General Comments        Pertinent Vitals/Pain Pain Assessment: 0-10 Pain Score: 4  Pain Location: 4/10 bilat knees; Left knee worse with ROM/activity Pain Intervention(s): Limited activity within patient's tolerance;Monitored during session;Repositioned    Home Living                      Prior Function            PT Goals (current goals can now be found in the care plan section) Acute Rehab PT Goals Patient Stated Goal: to get better/go home as independently as possible  PT Goal Formulation: With patient Time For Goal Achievement:  07/18/17 Potential to Achieve Goals: Fair Progress towards PT goals: Progressing toward goals    Frequency    7X/week      PT Plan Current plan remains appropriate    Co-evaluation              AM-PAC PT "6 Clicks" Daily Activity  Outcome Measure  Difficulty turning over in bed (including adjusting bedclothes, sheets and blankets)?: A Lot Difficulty moving from lying on back to sitting on the side of the bed? : A Lot Difficulty sitting down on and standing up from a chair with arms (e.g., wheelchair, bedside commode, etc,.)?: A Little Help needed moving to and from a bed to chair (including a wheelchair)?: A Little Help needed walking in hospital room?: A Little Help needed climbing 3-5 steps with a railing? : Total 6 Click Score: 14    End of Session Equipment Utilized During Treatment: Gait belt;Left knee immobilizer;Right knee immobilizer Activity Tolerance: Patient limited by pain;Patient tolerated treatment well;Patient limited by fatigue Patient left: in chair;with call bell/phone within reach;with nursing/sitter in room   PT Visit Diagnosis: Unsteadiness on feet (R26.81);Muscle weakness (generalized) (M62.81);Difficulty in walking, not elsewhere classified (R26.2);Pain Pain - Right/Left: (bilat) Pain - part of body: Knee     Time: [id number] PT Time Calculation (min) (ACUTE ONLY): 39 min  Charges:  $Gait Training: 23-37 mins $Therapeutic Activity: 8-22 mins                    G Codes:       2:31 PM, 07/06/17 Rosamaria LintsAllan Curtis Saria Haran, PT, DPT [facility] Physical Therapist - [place] [phone] (Pager)  [phone] (Mobile)  [phone] (Office)       Samantha Curtis 07/06/2017, 2:27 PM

## 2017-07-06 NOTE — Progress Notes (Signed)
Subjective: 3 Days Post-Op Procedure(s) (LRB): TOTAL KNEE BILATERAL (Bilateral)   Patient feels much better today. She moved well with PT yesterday evening. She would like to try to go home on Sunday instead of going to CIR as she feels her husband will be able to help her.  Activity level:  wbat Diet tolerance:  ok Voiding:  ok Patient reports pain as mild and moderate.    Objective: Vital signs in last 24 hours: Temp:  [97.9 F (36.6 C)-99.4 F (37.4 C)] 97.9 F (36.6 C) (01/18 0557) Pulse Rate:  [92-96] 92 (01/18 0557) Resp:  [18] 18 (01/18 0557) BP: (112-131)/(57-75) 131/75 (01/18 0557) SpO2:  [98 %-100 %] 100 % (01/18 0557)  Labs: No results for input(s): HGB in the last 72 hours. No results for input(s): WBC, RBC, HCT, PLT in the last 72 hours. No results for input(s): NA, K, CL, CO2, BUN, CREATININE, GLUCOSE, CALCIUM in the last 72 hours. No results for input(s): LABPT, INR in the last 72 hours.  Physical Exam:  Neurologically intact ABD soft Neurovascular intact Sensation intact distally Intact pulses distally Dorsiflexion/Plantar flexion intact Incision: dressing C/D/I and no drainage No cellulitis present Compartment soft  Assessment/Plan:  3 Days Post-Op Procedure(s) (LRB): TOTAL KNEE BILATERAL (Bilateral) Advance diet Up with therapy Discharge home with home health on Sunday. Continue on ASA 325mg  BID x 4 weeks post op. Follow up in office 2 weeks post op.  Cleo Villamizar, Ginger OrganNDREW PAUL 07/06/2017, 7:43 AM

## 2017-07-06 NOTE — Progress Notes (Signed)
I met with pt at bedside to discuss her goals. She is doing well with therapy and no longer in need of an inpt rehab admit at this level. She plans to d/c home with HH/ RN CM aware. We will sign off at this time. [phone]

## 2017-07-06 NOTE — Discharge Instructions (Signed)

## 2017-07-07 NOTE — Progress Notes (Signed)
Physical Therapy Treatment Patient Details Name: Samantha Curtis MRN: [id number] DOB: 1965/10/07 Today's Date: 07/07/2017    History of Present Illness 51yo female s/p B TKRs done 07/03/17. PMH dysrhythmia, GERD, hx R knee and R shoulder arthroscopies.     PT Comments    Patient is making progress toward mobility goals. Pt tolerated increased gait distance and is beginning to demonstrate step through gait pattern. Limited by pain with weightbearing and requested pain meds end of session-RN notified. HEP next session.     Follow Up Recommendations  CIR;DC plan and follow up therapy as arranged by surgeon(Pt refusing to CIR and electing to DC home with HHPT)     Equipment Recommendations  Rolling walker with 5" wheels;3in1 (PT)    Recommendations for Other Services       Precautions / Restrictions Precautions Precautions: Fall;Knee Precaution Comments: B knee immobilizers  Required Braces or Orthoses: Knee Immobilizer - Right;Knee Immobilizer - Left Knee Immobilizer - Right: On at all times Knee Immobilizer - Left: On at all times Restrictions Weight Bearing Restrictions: Yes RLE Weight Bearing: Weight bearing as tolerated LLE Weight Bearing: Weight bearing as tolerated    Mobility  Bed Mobility Overal bed mobility: Needs Assistance Bed Mobility: Sit to Supine       Sit to supine: Min assist   General bed mobility comments: assist to bring bilat LE into bed  Transfers Overall transfer level: Needs assistance Equipment used: Rolling walker (2 wheeled) Transfers: Sit to/from Stand Sit to Stand: Min guard         General transfer comment: min guard for safety; no physical assist needed  Ambulation/Gait Ambulation/Gait assistance: Min guard;Supervision Ambulation Distance (Feet): 140 Feet Assistive device: Rolling walker (2 wheeled) Gait Pattern/deviations: Antalgic;Step-to pattern;Decreased stance time - left;Decreased step length - right     General Gait  Details: cues for posture and proximity to Rw; pt with improving step through pattern   Stairs            Wheelchair Mobility    Modified Rankin (Stroke Patients Only)       Balance Overall balance assessment: Needs assistance Sitting-balance support: Feet supported;No upper extremity supported Sitting balance-[name]: Good     Standing balance support: Bilateral upper extremity supported;During functional activity Standing balance-[name]: Fair                              Cognition Arousal/Alertness: Awake/alert Behavior During Therapy: WFL for tasks assessed/performed Overall Cognitive Status: Within Functional Limits for tasks assessed                                        Exercises      General Comments        Pertinent Vitals/Pain Pain Assessment: Faces Faces Pain Scale: Hurts even more Pain Location: bilat knees Pain Descriptors / Indicators: Sore;Guarding;Aching;Grimacing Pain Intervention(s): Limited activity within patient's tolerance;Monitored during session;Repositioned;Premedicated before session;Patient requesting pain meds-RN notified    Home Living                      Prior Function            PT Goals (current goals can now be found in the care plan section) Acute Rehab PT Goals PT Goal Formulation: With patient Time For Goal Achievement: 07/18/17 Potential to Achieve  Goals: Fair Progress towards PT goals: Progressing toward goals    Frequency    7X/week      PT Plan Current plan remains appropriate    Co-evaluation              AM-PAC PT "6 Clicks" Daily Activity  Outcome Measure  Difficulty turning over in bed (including adjusting bedclothes, sheets and blankets)?: A Lot Difficulty moving from lying on back to sitting on the side of the bed? : Unable Difficulty sitting down on and standing up from a chair with arms (e.g., wheelchair, bedside commode, etc,.)?:  Unable Help needed moving to and from a bed to chair (including a wheelchair)?: A Little Help needed walking in hospital room?: A Little Help needed climbing 3-5 steps with a railing? : A Lot 6 Click Score: 12    End of Session Equipment Utilized During Treatment: Gait belt;Left knee immobilizer;Right knee immobilizer Activity Tolerance: Patient limited by pain Patient left: with call bell/phone within reach;in bed Nurse Communication: Mobility status PT Visit Diagnosis: Unsteadiness on feet (R26.81);Muscle weakness (generalized) (M62.81);Difficulty in walking, not elsewhere classified (R26.2);Pain Pain - Right/Left: (bilat) Pain - part of body: Knee     Time: 1045-1105 PT Time Calculation (min) (ACUTE ONLY): 20 min  Charges:  $Gait Training: 8-22 mins                    G Codes:       Erline Levine, PTA Pager: [phone]     Carolynne Edouard 07/07/2017, 11:11 AM

## 2017-07-07 NOTE — Progress Notes (Signed)
    Patient doing well PO day 4 S/P BIL TKA per Dr Jerl Santosalldorf. Making slow steady progress in PT, reports mostly tightness in posterior knee's, L>R. She is eating NL and having NL B/B control yes passing gas. [facility] well [facility]    Physical Exam: Vitals:   07/06/17 2100 07/07/17 0627  BP: (!) 105/58 123/70  Pulse: 92 93  Resp: 18 18  Temp: 98.6 F (37 C) 98.3 F (36.8 C)  SpO2: 97% 98%   CBC Latest Ref Rng & Units 06/26/2017 06/09/2010  WBC 4.0 - 10.5 K/uL 5.7 -  Hemoglobin 12.0 - 15.0 g/dL 16.113.2 15.4(H)  Hematocrit 36.0 - 46.0 % 41.7 -  Platelets 150 - 400 K/uL 220 -   BMP Latest Ref Rng & Units 06/26/2017  Glucose 65 - 99 mg/dL 87  BUN 6 - 20 mg/dL 6  Creatinine 0.960.44 - 0.451.00 mg/dL 4.090.74  Sodium 811135 - 914145 mmol/L 135  Potassium 3.5 - 5.1 mmol/L 4.5  Chloride 101 - 111 mmol/L 100(L)  CO2 22 - 32 mmol/L 28  Calcium 8.9 - 10.3 mg/dL 9.4   Dressing's in place, CDI, distal compartments soft, knee immob on, pt sitting up in chair comfortably NVI  POD #4 s/p BIL TKA per Dr Jerl Santosalldorf doing well with slow steady progress in PT  - up with PT/OT, encourage ambulation  -WBAT - Cont current px regimen, scripts printed and in chart - ASA 325 BID for anticoag - [facility] with f/u in 2 weeks in office

## 2017-07-08 NOTE — Progress Notes (Signed)
Physical Therapy Treatment Patient Details Name: Samantha Curtis MRN: [id number] DOB: 1965-07-08 Today's Date: 07/08/2017    History of Present Illness 51yo female s/p B TKRs done 07/03/17. PMH dysrhythmia, GERD, hx R knee and R shoulder arthroscopies.     PT Comments    Patient tolerated gait and stair training well. Current plan remains appropriate.    Follow Up Recommendations  CIR;DC plan and follow up therapy as arranged by surgeon(Pt refusing to CIR and electing to DC home with HHPT)     Equipment Recommendations  Rolling walker with 5" wheels;3in1 (PT)    Recommendations for Other Services       Precautions / Restrictions Precautions Precautions: Fall;Knee Precaution Comments: B knee immobilizers  Required Braces or Orthoses: Knee Immobilizer - Right;Knee Immobilizer - Left Knee Immobilizer - Right: On at all times Knee Immobilizer - Left: On at all times Restrictions Weight Bearing Restrictions: Yes RLE Weight Bearing: Weight bearing as tolerated LLE Weight Bearing: Weight bearing as tolerated    Mobility  Bed Mobility               General bed mobility comments: pt OOB in chair upon arrival  Transfers Overall transfer level: Needs assistance Equipment used: Rolling walker (2 wheeled) Transfers: Sit to/from Stand Sit to Stand: Min guard         General transfer comment: min guard for safety; pt encouraged to attempt increasing bilat Knee flexion when sitting   Ambulation/Gait Ambulation/Gait assistance: Min guard Ambulation Distance (Feet): 150 Feet Assistive device: Rolling walker (2 wheeled) Gait Pattern/deviations: Antalgic;Step-to pattern;Decreased stance time - left;Decreased step length - right;Trunk flexed Gait velocity: decreased   General Gait Details: cues for posture, sequencing, and bilat quad activation during stance phase and increased bilat knee flexion during swing phase   Stairs     Stair Management: No rails;With  walker;Backwards;Step to pattern Number of Stairs: 1 General stair comments: cues for sequencing and technique; R LE as "good" leg; assist to stabilize RW  Wheelchair Mobility    Modified Rankin (Stroke Patients Only)       Balance Overall balance assessment: Needs assistance Sitting-balance support: Feet supported;No upper extremity supported Sitting balance-[name]: Good     Standing balance support: Bilateral upper extremity supported;During functional activity Standing balance-[name]: Fair                              Cognition Arousal/Alertness: Awake/alert Behavior During Therapy: WFL for tasks assessed/performed Overall Cognitive Status: Within Functional Limits for tasks assessed                                        Exercises      General Comments        Pertinent Vitals/Pain Pain Assessment: Faces Faces Pain Scale: Hurts whole lot Pain Location: bilat knees Pain Descriptors / Indicators: Sore;Guarding;Grimacing;Tightness;Moaning Pain Intervention(s): Limited activity within patient's tolerance;Monitored during session;Premedicated before session;Repositioned    Home Living                      Prior Function            PT Goals (current goals can now be found in the care plan section) Acute Rehab PT Goals Patient Stated Goal: to get better/go home as independently as possible  PT Goal Formulation: With patient  Time For Goal Achievement: 07/18/17 Potential to Achieve Goals: Fair Progress towards PT goals: Progressing toward goals    Frequency    7X/week      PT Plan Current plan remains appropriate    Co-evaluation              AM-PAC PT "6 Clicks" Daily Activity  Outcome Measure  Difficulty turning over in bed (including adjusting bedclothes, sheets and blankets)?: A Lot Difficulty moving from lying on back to sitting on the side of the bed? : Unable Difficulty sitting down on and  standing up from a chair with arms (e.g., wheelchair, bedside commode, etc,.)?: Unable Help needed moving to and from a bed to chair (including a wheelchair)?: A Little Help needed walking in hospital room?: A Little Help needed climbing 3-5 steps with a railing? : A Little 6 Click Score: 13    End of Session Equipment Utilized During Treatment: Gait belt;Left knee immobilizer Activity Tolerance: Patient tolerated treatment well Patient left: with call bell/phone within reach;in chair Nurse Communication: Mobility status PT Visit Diagnosis: Unsteadiness on feet (R26.81);Muscle weakness (generalized) (M62.81);Difficulty in walking, not elsewhere classified (R26.2);Pain Pain - Right/Left: (bilat) Pain - part of body: Knee     Time: 0920-0946 PT Time Calculation (min) (ACUTE ONLY): 26 min  Charges:  $Gait Training: 23-37 mins                    G Codes:       Erline Levine, PTA Pager: [phone]     Carolynne Edouard 07/08/2017, 9:55 AM

## 2017-07-08 NOTE — Progress Notes (Signed)
   Patient doing well PO day 5 S/P BIL TKA per Dr Jerl Santosalldorf. Making steady progress in PT, continues to report mostly tightness in posterior knee's, L>R. She is eating NL and having NL B/B control yes passing gas. [facility] Pain well controlled. Feels comfortable going home today.   Physical Exam: BP 116/66 (BP Location: Right Arm)   Pulse 85   Temp 98.7 F (37.1 C) (Oral)   Resp 16   Ht 5\' 3"  (1.6 m)   Wt 58.5 kg (129 lb) Comment: per pt report  SpO2 100%   BMI 22.85 kg/m   CBC Latest Ref Rng & Units 06/26/2017 06/09/2010  WBC 4.0 - 10.5 K/uL 5.7 -  Hemoglobin 12.0 - 15.0 g/dL 16.113.2 15.4(H)  Hematocrit 36.0 - 46.0 % 41.7 -  Platelets 150 - 400 K/uL 220 -   BMP Latest Ref Rng & Units 06/26/2017  Glucose 65 - 99 mg/dL 87  BUN 6 - 20 mg/dL 6  Creatinine 0.960.44 - 0.451.00 mg/dL 4.090.74  Sodium 811135 - 914145 mmol/L 135  Potassium 3.5 - 5.1 mmol/L 4.5  Chloride 101 - 111 mmol/L 100(L)  CO2 22 - 32 mmol/L 28  Calcium 8.9 - 10.3 mg/dL 9.4   Dressing's in place, CDI, distal compartments soft, knee immob on, pt sitting up in chair comfortably NVI  POD #5 s/p BIL TKA per Dr Jerl Santosalldorf doing well with slow steady progress in PT  - up with PT/OT, encourage ambulation             -WBAT - Cont current px regimen, scripts printed and in chart - ASA 325 BID for anticoag - likely d/c home today with HH, with f/u in 2 weeks in office

## 2017-07-08 NOTE — Progress Notes (Signed)
Physical Therapy Treatment Patient Details Name: Samantha MartRana S Curtis MRN: [id number] DOB: 1966/04/11 Today's Date: 07/08/2017    History of Present Illness 51yo female s/p B TKRs done 07/03/17. PMH dysrhythmia, GERD, hx R knee and R shoulder arthroscopies.     PT Comments    This session focused on bilat LE therex. Current plan remains appropriate.    Follow Up Recommendations  CIR;DC plan and follow up therapy as arranged by surgeon(Pt refusing to CIR and electing to DC home with HHPT)     Equipment Recommendations  Rolling walker with 5" wheels;3in1 (PT)    Recommendations for Other Services       Precautions / Restrictions Precautions Precautions: Fall;Knee Precaution Comments: B knee immobilizers  Required Braces or Orthoses: Knee Immobilizer - Right;Knee Immobilizer - Left Knee Immobilizer - Right: Other (comment)(dc with PT prior ) Knee Immobilizer - Left: Other (comment)(d/c with pt prior to OT session) Restrictions Weight Bearing Restrictions: Yes RLE Weight Bearing: Weight bearing as tolerated LLE Weight Bearing: Weight bearing as tolerated    Mobility  Bed Mobility               General bed mobility comments: pt OOB in chair upon arrival  Transfers Overall transfer level: Needs assistance Equipment used: Rolling walker (2 wheeled) Transfers: Sit to/from UGI CorporationStand;Stand Pivot Transfers Sit to Stand: Min guard Stand pivot transfers: Min guard       General transfer comment: min guard for safety; cues for posture and safe hand placement  Ambulation/Gait Ambulation/Gait assistance: Min guard Ambulation Distance (Feet): 150 Feet Assistive device: Rolling walker (2 wheeled) Gait Pattern/deviations: Antalgic;Step-to pattern;Decreased stance time - left;Decreased step length - right;Trunk flexed Gait velocity: decreased   General Gait Details: cues for posture, sequencing, and bilat quad activation during stance phase and increased bilat knee flexion during swing  phase   Stairs     Stair Management: No rails;With walker;Backwards;Step to pattern Number of Stairs: 1 General stair comments: cues for sequencing and technique; R LE as "good" leg; assist to stabilize RW  Wheelchair Mobility    Modified Rankin (Stroke Patients Only)       Balance Overall balance assessment: Needs assistance Sitting-balance support: Feet supported;No upper extremity supported Sitting balance-[name]: Good     Standing balance support: Bilateral upper extremity supported;During functional activity Standing balance-[name]: Fair                              Cognition Arousal/Alertness: Awake/alert Behavior During Therapy: WFL for tasks assessed/performed Overall Cognitive Status: Within Functional Limits for tasks assessed                                        Exercises Total Joint Exercises Ankle Circles/Pumps: AROM;Both;10 reps Quad Sets: AROM;Both;10 reps Heel Slides: Both;10 reps;AROM;AAROM Hip ABduction/ADduction: AROM;Both;10 reps Straight Leg Raises: AROM;Both;10 reps Knee Flexion: AROM;Both;5 reps;Seated;Other (comment)(10 sec holds)    General Comments        Pertinent Vitals/Pain Pain Assessment: Faces Pain Score: 7  Faces Pain Scale: Hurts even more Pain Location: bilat knees Pain Descriptors / Indicators: Sore;Guarding;Grimacing;Tightness Pain Intervention(s): Limited activity within patient's tolerance;Monitored during session;Premedicated before session;Repositioned;Patient requesting pain meds-RN notified;Ice applied    Home Living                      Prior Function  PT Goals (current goals can now be found in the care plan section) Acute Rehab PT Goals Patient Stated Goal: to get better/go home as independently as possible  PT Goal Formulation: With patient Time For Goal Achievement: 07/18/17 Potential to Achieve Goals: Fair Progress towards PT goals: Progressing  toward goals    Frequency    7X/week      PT Plan Current plan remains appropriate    Co-evaluation              AM-PAC PT "6 Clicks" Daily Activity  Outcome Measure  Difficulty turning over in bed (including adjusting bedclothes, sheets and blankets)?: A Lot Difficulty moving from lying on back to sitting on the side of the bed? : A Lot Difficulty sitting down on and standing up from a chair with arms (e.g., wheelchair, bedside commode, etc,.)?: Unable Help needed moving to and from a bed to chair (including a wheelchair)?: A Little Help needed walking in hospital room?: A Little Help needed climbing 3-5 steps with a railing? : A Little 6 Click Score: 14    End of Session Equipment Utilized During Treatment: Gait belt Activity Tolerance: Patient tolerated treatment well Patient left: with call bell/phone within reach;in chair Nurse Communication: Mobility status PT Visit Diagnosis: Unsteadiness on feet (R26.81);Muscle weakness (generalized) (M62.81);Difficulty in walking, not elsewhere classified (R26.2);Pain Pain - Right/Left: (bilat) Pain - part of body: Knee     Time: 1200-1226 PT Time Calculation (min) (ACUTE ONLY): 26 min  Charges:   $Therapeutic Exercise: 8-22 mins $Therapeutic Activity: 8-22 mins                    G Codes:       Erline Levine, PTA Pager: [phone]     Carolynne Edouard 07/08/2017, 12:59 PM

## 2017-07-08 NOTE — Discharge Summary (Signed)
Patient ID: Gevena MartRana S Witthuhn MRN: [id number] DOB/AGE: 12/15/1965 52 y.o.  Admit date: 07/03/2017 Discharge date: 07/08/2017  Admission Diagnoses:  Principal Problem:   Bilateral primary osteoarthritis of knee Active Problems:   S/P TKR (total knee replacement), bilateral   Gastroesophageal reflux disease   Post-operative pain   Elevated blood pressure reading   Discharge Diagnoses:  Same  Past Medical History:  Diagnosis Date  . Arthritis    "knees" (07/04/2017)  . Dysrhythmia    PALPITATIONS  . GERD (gastroesophageal reflux disease)   . High cholesterol   . Leg pain     Surgeries: Procedure(s): TOTAL KNEE BILATERAL on 07/03/2017   Consultants: None  Discharged Condition: Improved  Hospital Course: Delaila Bettina GaviaS Manigo is an 52 y.o. female who was admitted 07/03/2017 for operative treatment of Bilateral primary osteoarthritis of knee. Patient has severe unremitting pain that affects sleep, daily activities, and work/hobbies. After pre-op clearance the patient was taken to the operating room on 07/03/2017 and underwent  Procedure(s): TOTAL KNEE BILATERAL.    Patient was given perioperative antibiotics:  Anti-infectives (From admission, onward)   Start     Dose/Rate Route Frequency Ordered Stop   07/03/17 1930  vancomycin (VANCOCIN) IVPB 1000 mg/200 mL premix     1,000 mg 200 mL/hr over 60 Minutes Intravenous Every 12 hours 07/03/17 1649 07/03/17 2111   07/03/17 0600  vancomycin (VANCOCIN) IVPB 1000 mg/200 mL premix     1,000 mg 200 mL/hr over 60 Minutes Intravenous To ShortStay Surgical 07/02/17 1102 07/03/17 0735       Patient was given sequential compression devices, early ambulation to prevent DVT.  Patient benefited maximally from hospital stay and there were no complications.    Recent vital signs:  Patient Vitals for the past 24 hrs:  BP Temp Temp src Pulse Resp SpO2 Height Weight  07/08/17 0509 116/66 98.7 F (37.1 C) Oral 85 16 100 % - -  07/07/17 1951 114/67  98.6 F (37 C) Oral 78 18 98 % - -  07/07/17 1438 102/60 98.4 F (36.9 C) Oral 76 17 98 % 5\' 3"  (1.6 m) 58.5 kg (129 lb)     Discharge Medications:   Allergies as of 07/08/2017      Reactions   Flexeril [cyclobenzaprine] Hives   Latex Other (See Comments)   Sores in mouth from dentist   Amoxicillin-pot Clavulanate Itching   Has patient had a PCN reaction causing immediate rash, facial/tongue/throat swelling, SOB or lightheadedness with hypotension: No Has patient had a PCN reaction causing severe rash involving mucus membranes or skin necrosis: No Has patient had a PCN reaction that required hospitalization: No Has patient had a PCN reaction occurring within the last 10 years: No If all of the above answers are "NO", then may proceed with Cephalosporin use.   Levofloxacin Nausea Only      Medication List    STOP taking these medications   naproxen sodium 220 MG tablet Commonly known as:  ALEVE     TAKE these medications   aspirin 325 MG EC tablet Take 1 tablet (325 mg total) by mouth 2 (two) times daily after a meal.   bisacodyl 5 MG EC tablet Commonly known as:  DULCOLAX Take 1 tablet (5 mg total) by mouth daily as needed for moderate constipation.   cetirizine 10 MG tablet Commonly known as:  ZYRTEC Take 10 mg by mouth daily.   docusate sodium 100 MG capsule Commonly known as:  COLACE Take 1 capsule (  100 mg total) by mouth 2 (two) times daily.   metoprolol tartrate 25 MG tablet Commonly known as:  LOPRESSOR Take 25 mg by mouth 2 (two) times daily.   oxyCODONE-acetaminophen 5-325 MG tablet Commonly known as:  PERCOCET Take 1-2 tablets by mouth every 4 (four) hours as needed for severe pain.   ranitidine 150 MG tablet Commonly known as:  ZANTAC Take 150 mg by mouth daily.   tiZANidine 4 MG tablet Commonly known as:  ZANAFLEX Take 1 tablet (4 mg total) by mouth every 6 (six) hours as needed for muscle spasms.            Durable Medical Equipment  (From  admission, onward)        Start     Ordered   07/03/17 1650  DME Walker rolling  Once    Question:  Patient needs a walker to treat with the following condition  Answer:  Bilateral primary osteoarthritis of knee   07/03/17 1649   07/03/17 1650  DME 3 n 1  Once     07/03/17 1649   07/03/17 1650  DME Bedside commode  Once    Question:  Patient needs a bedside commode to treat with the following condition  Answer:  Bilateral primary osteoarthritis of knee   07/03/17 1649      Diagnostic Studies: Dg Chest 2 View  Result Date: 06/27/2017 CLINICAL DATA:  Bilateral knee replacements, preop. EXAM: CHEST  2 VIEW COMPARISON:  None. FINDINGS: Heart and mediastinal contours are within normal limits. No focal opacities or effusions. No acute bony abnormality. IMPRESSION: No active cardiopulmonary disease. Electronically Signed   By: Charlett Nose M.D.   On: 06/27/2017 08:10    Disposition:   Discharge Instructions    Discharge patient   Complete by:  As directed    After PT [facility] with [facility]   Discharge disposition:  01-Home or Self Care   Discharge patient date:  07/08/2017     POD #5s/p BIL TKA per Dr Jerl Santos doing well with slow steady progress in PT  - up with PT/OT, encourage ambulation -WBAT -Cont current px regimen, scripts printed and in chart - ASA 325 BID for anticoag -Written scripts for pain signed and in chart -D/C instructions sheet printed and in chart -D/C today  -F/U in office 2 weeks   Signed: Georga Bora 07/08/2017, 9:04 AM

## 2017-07-08 NOTE — Progress Notes (Signed)
Occupational Therapy Treatment Patient Details Name: Samantha Curtis MRN: [id number] DOB: 1965/09/15 Today's Date: 07/08/2017    History of present illness 52yo female s/p B TKRs done 07/03/17. PMH dysrhythmia, GERD, hx R knee and R shoulder arthroscopies.    OT comments  Pt is at an adequate level from OT standpoint to d/c home with spouse (A). Pt dressed and in chair. Pt requesting to sleep prior to calling spouse for pending d/c today.    Follow Up Recommendations  No OT follow up    Equipment Recommendations  3 in 1 bedside commode    Recommendations for Other Services      Precautions / Restrictions Precautions Precautions: Fall;Knee Precaution Comments: B knee immobilizers  Required Braces or Orthoses: Knee Immobilizer - Right;Knee Immobilizer - Left Knee Immobilizer - Right: Other (comment)(dc with PT prior ) Knee Immobilizer - Left: Other (comment)(d/c with pt prior to OT session) Restrictions Weight Bearing Restrictions: Yes RLE Weight Bearing: Weight bearing as tolerated LLE Weight Bearing: Weight bearing as tolerated       Mobility Bed Mobility               General bed mobility comments: in chair on arrival  Transfers Overall transfer level: Needs assistance Equipment used: Rolling walker (2 wheeled) Transfers: Sit to/from Stand Sit to Stand: Min guard         General transfer comment: min guard for safety; pt encouraged to attempt increasing bilat Knee flexion when sitting     Balance Overall balance assessment: Needs assistance Sitting-balance support: Feet supported;No upper extremity supported Sitting balance-[name]: Good     Standing balance support: Bilateral upper extremity supported;During functional activity Standing balance-[name]: Fair                             ADL either performed or assessed with clinical judgement   ADL Overall ADL's : Needs assistance/impaired             Lower Body Bathing:  Minimal assistance       Lower Body Dressing: Minimal assistance Lower Body Dressing Details (indicate cue type and reason): pt is able to reach ankles and pt was able to thread one leg into pants with assist of the second side         Tub/ Shower Transfer: Minimal assistance Tub/Shower Transfer Details (indicate cue type and reason): pt able to verbalize directions so that she can be independent in directing care with spouse Functional mobility during ADLs: Minimal assistance;Rolling walker General ADL Comments: pt educated on bathing dressing, bed mobility with use of sheet as leg lifter if needed. pt asking about sleeping in side lying and educated on use of pillow vs KI if needed for positioning   Educated patient on knee full extension with return demonstration, educated shower transfer,never to wash directly on incision site, always use fresh clean linen (one time use then place in laundry), avoid water under bandage and benefits of wrapping dressing, sleeping positioning, avoid putting pillow under knee and   Vision       Perception     Praxis      Cognition Arousal/Alertness: Awake/alert Behavior During Therapy: WFL for tasks assessed/performed Overall Cognitive Status: Within Functional Limits for tasks assessed  Exercises     Shoulder Instructions       General Comments      Pertinent Vitals/ Pain       Pain Assessment: 0-10 Pain Score: 7  Faces Pain Scale: Hurts whole lot Pain Location: bilat knees Pain Descriptors / Indicators: Sore;Guarding;Grimacing;Tightness;Moaning Pain Intervention(s): Monitored during session;Premedicated before session;Repositioned  Home Living                                          Prior Functioning/Environment              Frequency  Min 2X/week        Progress Toward Goals  OT Goals(current goals can now be found in the care plan  section)  Progress towards OT goals: Progressing toward goals  Acute Rehab OT Goals Patient Stated Goal: to get better/go home as independently as possible  OT Goal Formulation: With patient/family Time For Goal Achievement: 07/18/17 Potential to Achieve Goals: Good ADL Goals Pt Will Perform Lower Body Dressing: with supervision;sit to/from stand;with adaptive equipment Pt Will Transfer to Toilet: with set-up;ambulating;bedside commode Additional ADL Goal #1: pt will complete bed mobility supervision level as precursor to adls.  Plan Discharge plan needs to be updated    Co-evaluation                 AM-PAC PT "6 Clicks" Daily Activity     Outcome Measure   Help from another person eating meals?: None Help from another person taking care of personal grooming?: A Little Help from another person toileting, which includes using toliet, bedpan, or urinal?: A Little Help from another person bathing (including washing, rinsing, drying)?: A Little Help from another person to put on and taking off regular upper body clothing?: A Little Help from another person to put on and taking off regular lower body clothing?: A Little 6 Click Score: 19    End of Session Equipment Utilized During Treatment: Gait belt;Rolling walker  OT Visit Diagnosis: Unsteadiness on feet (R26.81);Pain Pain - Right/Left: Right Pain - part of body: Leg   Activity Tolerance Patient tolerated treatment well   Patient Left in chair;with call bell/phone within reach   Nurse Communication Mobility status;Precautions        Time: [id number] OT Time Calculation (min): 27 min  Charges: OT General Charges $OT Visit: 1 Visit OT Treatments $Self Care/Home Management : 23-37 mins   [name], Brynn   OTR/L Pager: [phone] Office: [phone] .    [name], [name] 07/08/2017, 11:12 AM

## 2017-07-10 ENCOUNTER — Other Ambulatory Visit: Payer: Self-pay | Admitting: *Deleted

## 2017-07-10 ENCOUNTER — Encounter: Payer: Self-pay | Admitting: *Deleted

## 2017-07-10 NOTE — Patient Outreach (Signed)
[facility] Care Management  07/10/2017  Samantha Curtis Jun 28, 1965 [id number]  Subjective:  Telephone call to patient's home / mobile number, spoke with patient, and HIPAA verified.  Discussed [facility] Care Management Focus Plan Transition of care follow up, patient voiced understanding, and is in agreement to follow up.   Patient states she is hanging in there, the surgery was very painful, continuing to work on pain management, discussed pain management strategies, patient voiced understanding, and is in agreement, will follow up with MD sooner if needed.  States she has a follow up appointment with surgeon on 07/13/17.   Patient states home health physical therapy in place and is going well.  Patient states she is able to manage self care and has assistance as needed with activities of daily living / home management.  Patient voices understanding of medical diagnosis, surgery, and treatment plan.  States she is accessing the following [facility] benefits: outpatient pharmacy, hospital indemnity (has claim form, requested itemize bill, will file claim when appropriate), and has family medical leave act (FMLA) in place.  Patient states she does not have any education material, transition of care, care coordination, disease management, disease monitoring, transportation, community resource, or pharmacy needs at this time.  States she is very appreciative of the follow up and is in agreement to receive [facility] information.      Objective: Per KPN (Knowledge Performance Now, point of care tool) and chart review, patient hospitalized 07/03/17 -07/08/17 for Bilateral primary osteoarthritis of knee.    Status post bilateral total knee arthroplasties on 07/03/17.  Patient also has a history of dysrhythmia (palpitations).      Assessment:  Received [facility] transition of care referral on 07/04/17.   Transition of care follow up completed, no care management needs, and will proceed with case  closure.      Plan: RNCM will send patient successful outreach letter, [facility] pamphlet, and magnet. RNCM will send case closure due to follow up completed / no care management needs request to Iverson AlaminLaura Greeson at [facility] Care [facility].       Calleigh Lafontant H. Gardiner Barefootooper RN, BSN, CCM [facility] Care Management [facility] Telephonic CM Phone: [phone] Fax: [phone]

## 2017-07-31 ENCOUNTER — Encounter (HOSPITAL_COMMUNITY): Payer: 59

## 2017-07-31 ENCOUNTER — Ambulatory Visit: Payer: 59 | Admitting: Vascular Surgery

## 2017-09-11 ENCOUNTER — Ambulatory Visit: Payer: Self-pay | Admitting: Vascular Surgery

## 2017-09-11 ENCOUNTER — Encounter (HOSPITAL_COMMUNITY): Payer: No Typology Code available for payment source

## 2017-09-27 MED FILL — METOPROLOL TARTRATE 25 MG T: 25 | 90 days supply | Qty: 90 | Fill #5

## 2017-11-13 ENCOUNTER — Other Ambulatory Visit: Payer: Self-pay | Admitting: *Deleted

## 2017-11-13 DIAGNOSIS — I83893 Varicose veins of bilateral lower extremities with other complications: Secondary | ICD-10-CM

## 2017-11-16 MED FILL — raNITIdine HCL 150 MG TABS: 150 | 90 days supply | Qty: 180 | Fill #0

## 2017-11-16 MED FILL — GABAPENTIN 100 MG CAP: 100 | 30 days supply | Qty: 30 | Fill #0

## 2017-12-25 MED FILL — GABAPENTIN 100 MG CAP: 100 | 30 days supply | Qty: 30 | Fill #0

## 2017-12-27 MED FILL — METOPROLOL TARTRATE 25 MG T: 25 | 60 days supply | Qty: 60 | Fill #6

## 2018-01-01 ENCOUNTER — Ambulatory Visit: Payer: No Typology Code available for payment source | Admitting: Vascular Surgery

## 2018-01-01 ENCOUNTER — Encounter (HOSPITAL_COMMUNITY): Payer: No Typology Code available for payment source

## 2018-01-03 ENCOUNTER — Ambulatory Visit: Payer: No Typology Code available for payment source | Admitting: Vascular Surgery

## 2018-02-11 MED FILL — raNITIdine HCL 150 MG TABS: 150 | 90 days supply | Qty: 180 | Fill #1

## 2018-02-27 ENCOUNTER — Encounter: Payer: Self-pay | Admitting: Vascular Surgery

## 2018-02-27 ENCOUNTER — Ambulatory Visit (HOSPITAL_COMMUNITY)
Admission: RE | Admit: 2018-02-27 | Discharge: 2018-02-27 | Disposition: A | Discharge status: Home / Self Care | Payer: No Typology Code available for payment source | Source: Ambulatory Visit | Attending: Vascular Surgery | Admitting: Vascular Surgery

## 2018-02-27 ENCOUNTER — Ambulatory Visit (INDEPENDENT_AMBULATORY_CARE_PROVIDER_SITE_OTHER): Payer: No Typology Code available for payment source | Admitting: Vascular Surgery

## 2018-02-27 VITALS — BP 133/86 | HR 63 | Resp 18 | Ht 63.0 in | Wt 120.0 lb

## 2018-02-27 DIAGNOSIS — I83893 Varicose veins of bilateral lower extremities with other complications: Secondary | ICD-10-CM | POA: Insufficient documentation

## 2018-02-27 NOTE — Progress Notes (Addendum)
Patient name: Samantha Curtis MRN: [id number] DOB: 08/01/1965 Sex: female  REASON FOR VISIT:   Varicose veins right leg.  HPI:   Samantha Curtis is a pleasant 52 y.o. female who was last seen by Dr. Tawanna Cooler Early on 11/28/2016.  She had painful varicose veins.  She is previously had endovenous laser ablation of the left great saphenous vein by [facility] imaging.  She was having problems with the right leg when she was seen last.  Of note she is had bilateral knee replacements about 2 years ago.  Duplex at that time showed reflux in the great saphenous vein on the right throughout its entire course.  Dr. Tawanna Cooler Early felt she would be a good candidate for a venous laser ablation of the right great saphenous vein.  She wanted to defer this until after her surgery.  The patient describes significant aching pain and heaviness in the right leg which is aggravated by standing at work and relieved somewhat with elevation.  In addition, when she returns home at the end of the day, she experiences significant aching pain and heaviness in her right leg when she is washing dishes and doing the laundry.  The symptoms also are alleviated somewhat with elevation.  She has worn her thigh-high compression stockings without much relief.  She takes ibuprofen as needed and his required much more recently.  She works long hours standing as a Engineer, structural.  She is unaware of any history of DVT or phlebitis.  Current Outpatient Medications  Medication Sig Dispense Refill  . aspirin EC 325 MG EC tablet Take 1 tablet (325 mg total) by mouth 2 (two) times daily after a meal. 60 tablet 0  . bisacodyl (DULCOLAX) 5 MG EC tablet Take 1 tablet (5 mg total) by mouth daily as needed for moderate constipation. 10 tablet 0  . cetirizine (ZYRTEC) 10 MG tablet Take 10 mg by mouth daily.    Marland Kitchen docusate sodium (COLACE) 100 MG capsule Take 1 capsule (100 mg total) by mouth 2 (two) times daily. 30 capsule 0  . metoprolol tartrate (LOPRESSOR)  25 MG tablet Take 25 mg by mouth 2 (two) times daily.   1  . oxyCODONE-acetaminophen (PERCOCET) 5-325 MG tablet Take 1-2 tablets by mouth every 4 (four) hours as needed for severe pain. 40 tablet 0  . ranitidine (ZANTAC) 150 MG tablet Take 150 mg by mouth daily.   3  . tiZANidine (ZANAFLEX) 4 MG tablet Take 1 tablet (4 mg total) by mouth every 6 (six) hours as needed for muscle spasms. 40 tablet 1   No current facility-administered medications for this visit.     REVIEW OF SYSTEMS:  [X]  denotes positive finding, [ ]  denotes negative finding Vascular    Leg swelling    Cardiac    Chest pain or chest pressure:    Shortness of breath upon exertion:    Short of breath when lying flat:    Irregular heart rhythm:    Constitutional    Fever or chills:     PHYSICAL EXAM:   Vitals:   02/27/18 1443  BP: 133/86  Pulse: 63  Resp: 18  SpO2: 100%  Weight: 120 lb (54.4 kg)  Height: 5\' 3"  (1.6 m)    GENERAL: The patient is a well-nourished female, in no acute distress. The vital signs are documented above. CARDIOVASCULAR: There is a regular rate and rhythm. PULMONARY: There is good air exchange bilaterally without wheezing or rales. VASCULAR: ARTERIAL: She has  palpable pedal pulses bilaterally VENOUS: She has some spider veins and varicose veins of her anterior right leg and medial right calf. I looked at her great saphenous vein with the SonoSite and it is dilated at the level of the knee and somewhat smaller above that level.  There is reflux throughout. I think we could potentially cannulate the vein just above the knee. She has no significant hyperpigmentation and no significant leg swelling currently.  DATA:   VENOUS DUPLEX: I have independently interpreted her venous duplex scan of the right lower extremity today.  There is no evidence of deep venous thrombosis or superficial thrombophlebitis.  There is deep venous reflux involving the common femoral vein.  There is reflux  throughout the entire saphenous vein and the vein is significantly dilated.  MEDICAL ISSUES:   PAINFUL VARICOSE VEINS RIGHT LOWER EXTREMITY: This patient continues to have significant disabling pain of her right lower extremity related to her varicose veins.  She is failed conservative treatment.  I think she is a reasonable candidate for endovenous laser ablation of the right great saphenous vein and I think she can have 1 unit of scleral to treat the spit spider veins and varicose veins in her right leg.  I have discussed the indications for endovenous laser ablation of the right GSV, that is to lower the pressure in the veins and potentially help relieve the symptoms from venous hypertension. I have also discussed alternative options including conservative treatment with leg elevation, compression therapy, exercise, avoiding prolonged sitting and standing, and weight management. I have discussed the potential complications of the procedure, including, but not limited to: bleeding, bruising, leg swelling, nerve injury, skin burns, significant pain from phlebitis, deep venous thrombosis, or failure of the vein to close.  I have also discussed the possibility of having difficulty advancing the wire through the vein in the thigh which might make the procedure unsuccessful.  I have also explained that venous insufficiency is a chronic disease, and that the patient is at risk for recurrent varicose veins in the future.  All of the patient's questions were encouraged and answered. They are agreeable to proceed.    Waverly Ferrari [facility] [phone]

## 2018-03-01 MED FILL — METOPROLOL TARTRATE 25 MG T: 25 | 90 days supply | Qty: 90 | Fill #0

## 2018-03-12 MED FILL — GABAPENTIN 100 MG CAP: 100 | 30 days supply | Qty: 30 | Fill #0

## 2018-03-21 ENCOUNTER — Other Ambulatory Visit: Payer: Self-pay | Admitting: Obstetrics and Gynecology

## 2018-03-21 DIAGNOSIS — Z1231 Encounter for screening mammogram for malignant neoplasm of breast: Secondary | ICD-10-CM

## 2018-04-17 ENCOUNTER — Other Ambulatory Visit: Payer: Self-pay | Admitting: *Deleted

## 2018-04-17 DIAGNOSIS — I83893 Varicose veins of bilateral lower extremities with other complications: Secondary | ICD-10-CM

## 2018-05-02 ENCOUNTER — Encounter: Payer: Self-pay | Admitting: Vascular Surgery

## 2018-05-02 ENCOUNTER — Ambulatory Visit (INDEPENDENT_AMBULATORY_CARE_PROVIDER_SITE_OTHER): Payer: No Typology Code available for payment source | Admitting: Vascular Surgery

## 2018-05-02 VITALS — BP 105/61 | HR 56 | Temp 97.6°F | Resp 16 | Ht 63.0 in | Wt 121.0 lb

## 2018-05-02 DIAGNOSIS — I872 Venous insufficiency (chronic) (peripheral): Secondary | ICD-10-CM

## 2018-05-02 NOTE — Progress Notes (Signed)
Patient name: Samantha Curtis MRN: [id number] DOB: [birth year] Sex: female  REASON FOR VISIT:   For endovenous laser ablation of the right great saphenous vein  HPI:   Samantha Curtis is a pleasant 52 y.o. female previously seen in consultation for chronic venous insufficiency.  She has disabling symptoms her right leg and reflux in her right great saphenous vein.  She is failed conservative treatment.  She presents for endovenous laser ablation of the right great saphenous vein.  Current Outpatient Medications  Medication Sig Dispense Refill  . aspirin EC 325 MG EC tablet Take 1 tablet (325 mg total) by mouth 2 (two) times daily after a meal. 60 tablet 0  . bisacodyl (DULCOLAX) 5 MG EC tablet Take 1 tablet (5 mg total) by mouth daily as needed for moderate constipation. 10 tablet 0  . cetirizine (ZYRTEC) 10 MG tablet Take 10 mg by mouth daily.    Marland Kitchen docusate sodium (COLACE) 100 MG capsule Take 1 capsule (100 mg total) by mouth 2 (two) times daily. 30 capsule 0  . metoprolol tartrate (LOPRESSOR) 25 MG tablet Take 25 mg by mouth 2 (two) times daily.   1  . oxyCODONE-acetaminophen (PERCOCET) 5-325 MG tablet Take 1-2 tablets by mouth every 4 (four) hours as needed for severe pain. 40 tablet 0  . ranitidine (ZANTAC) 150 MG tablet Take 150 mg by mouth daily.   3  . tiZANidine (ZANAFLEX) 4 MG tablet Take 1 tablet (4 mg total) by mouth every 6 (six) hours as needed for muscle spasms. 40 tablet 1   No current facility-administered medications for this visit.     REVIEW OF SYSTEMS:  [X]  denotes positive finding, [ ]  denotes negative finding Vascular    Leg swelling    Cardiac    Chest pain or chest pressure:    Shortness of breath upon exertion:    Short of breath when lying flat:    Irregular heart rhythm:    Constitutional    Fever or chills:     PHYSICAL EXAM:   Vitals:   05/02/18 1341  BP: 105/61  Pulse: (!) 56  Resp: 16  Temp: 97.6 F (36.4 C)  SpO2: 100%  Weight: 121 lb (54.9 kg)   Height: 5\' 3"  (1.6 m)    GENERAL: The patient is a well-nourished female, in no acute distress. The vital signs are documented above.  DATA:   No new data  MEDICAL ISSUES:   ENDOVENOUS LASER ABLATION RIGHT GREAT SAPHENOUS VEIN: Patient was taken to the exam room.  With the patient supine I examined the right great saphenous vein.  It appeared that the best site to cannulate this was just above the knee.  The right leg was prepped and draped in usual sterile fashion.  After the skin was anesthetized, under ultrasound guidance, the right great saphenous vein was cannulated with a micropuncture needle and a micropuncture sheath introduced over the wire.  I think advanced a J-wire up to just below the saphenofemoral junction.  The sheath was then advanced over the wire and positioned just below the saphenofemoral junction.  The dilator was removed and the wire removed.  I then advanced the laser fiber within the sheath up to the 2 to 3 cm below the saphenofemoral junction and the sheath was slightly retracted.  The skin was anesthetized and then tumescent anesthesia infiltrated along the entire course of the right great saphenous vein from the groin to the distal thigh.  Patient was then  placed in Trendelenburg.  Endovenous laser ablation was performed using a total of 1785 J of energy to the distal thigh.  Patient tolerated the procedure well.  A sterile pressure dressing was applied.  Patient will return in 1 week for follow-up duplex.  Waverly Ferrarihristopher Dickson [facility] [phone]

## 2018-05-02 NOTE — Progress Notes (Signed)
     Laser Ablation Procedure    Date: 05/02/2018   Samantha Curtis DOB:Oct 19, 1965  Consent signed: Yes    Surgeon:  Dr. Tawanna Coolerodd Early  Procedure: Laser Ablation: right Greater Saphenous Vein  BP 105/61   Pulse (!) 56   Temp 97.6 F (36.4 C)   Resp 16   Ht 5\' 3"  (1.6 m)   Wt 121 lb (54.9 kg)   SpO2 100%   BMI 21.43 kg/m   Tumescent Anesthesia: 430 cc 0.9% NaCl with 50 cc Lidocaine HCL with 1% Epi and 15 cc 8.4% NaHCO3  Local Anesthesia: 6 cc Lidocaine HCL and NaHCO3 (ratio 2:1)  15 watts continuous mode        Total energy: 1785   Total time: 1:59    Patient tolerated procedure well  Notes:   Description of Procedure:  After marking the course of the secondary varicosities, the patient was placed on the operating table in the supine position, and the right leg was prepped and draped in sterile fashion.   Local anesthetic was administered and under ultrasound guidance the saphenous vein was accessed with a micro needle and guide wire; then the mirco puncture sheath was placed.  A guide wire was inserted saphenofemoral junction , followed by a 5 french sheath.  The position of the sheath and then the laser fiber below the junction was confirmed using the ultrasound.  Tumescent anesthesia was administered along the course of the saphenous vein using ultrasound guidance. The patient was placed in Trendelenburg position and protective laser glasses were placed on patient and staff, and the laser was fired at 15 watts continuous mode advancing 1-742mm/second for a total of 1785 joules.      Steri strips were applied to the stab wounds and ABD pads and thigh high compression stockings were applied.  Ace wrap bandages were applied over the phlebectomy sites and at the top of the saphenofemoral junction. Blood loss was less than 15 cc.  The patient ambulated out of the operating room having tolerated the procedure well.

## 2018-05-06 MED FILL — SHINGRIX 50 MCG SUS: 50 | 1 days supply | Qty: 1 | Fill #0

## 2018-05-06 MED FILL — AMOXICILLIN 500 MG CAPSULE: 500 | 5 days supply | Qty: 20 | Fill #0

## 2018-05-07 MED FILL — SF 5000 PLUS CREAM: 1.1 | 30 days supply | Qty: 51 | Fill #0

## 2018-05-08 ENCOUNTER — Ambulatory Visit (INDEPENDENT_AMBULATORY_CARE_PROVIDER_SITE_OTHER): Payer: No Typology Code available for payment source | Admitting: Vascular Surgery

## 2018-05-08 ENCOUNTER — Ambulatory Visit
Admission: RE | Admit: 2018-05-08 | Discharge: 2018-05-08 | Disposition: A | Discharge status: Home / Self Care | Payer: No Typology Code available for payment source | Source: Ambulatory Visit | Attending: Obstetrics and Gynecology | Admitting: Obstetrics and Gynecology

## 2018-05-08 ENCOUNTER — Ambulatory Visit (HOSPITAL_COMMUNITY)
Admission: RE | Admit: 2018-05-08 | Discharge: 2018-05-08 | Disposition: A | Discharge status: Home / Self Care | Payer: No Typology Code available for payment source | Source: Ambulatory Visit | Attending: Vascular Surgery | Admitting: Vascular Surgery

## 2018-05-08 ENCOUNTER — Encounter: Payer: Self-pay | Admitting: Vascular Surgery

## 2018-05-08 DIAGNOSIS — Z48812 Encounter for surgical aftercare following surgery on the circulatory system: Secondary | ICD-10-CM | POA: Diagnosis not present

## 2018-05-08 DIAGNOSIS — I83893 Varicose veins of bilateral lower extremities with other complications: Secondary | ICD-10-CM | POA: Insufficient documentation

## 2018-05-08 DIAGNOSIS — Z1231 Encounter for screening mammogram for malignant neoplasm of breast: Secondary | ICD-10-CM

## 2018-05-08 NOTE — Progress Notes (Signed)
Patient name: Samantha Curtis MRN: [id number] DOB: 02-14-1966 Sex: female  REASON FOR VISIT:   Follow-up after endovenous laser ablation of the right great saphenous vein.  HPI:   Samantha Curtis Rasmus is a pleasant 52 y.o. female who underwent endovenous laser ablation of her right great saphenous vein on 05/02/2018 for significant reflux and venous hypertension in the right lower extremity.  She comes in for her 1 week follow-up visit.  She has no specific complaints and I have seen her back at work and she is doing well.  She does continue to have some discomfort associated with her telangiectasias and reticular veins of the right thigh and calf.  She has moderate bruising.  She has mild discomfort and will take her ibuprofen as needed.  Current Outpatient Medications  Medication Sig Dispense Refill  . aspirin EC 325 MG EC tablet Take 1 tablet (325 mg total) by mouth 2 (two) times daily after a meal. 60 tablet 0  . bisacodyl (DULCOLAX) 5 MG EC tablet Take 1 tablet (5 mg total) by mouth daily as needed for moderate constipation. 10 tablet 0  . cetirizine (ZYRTEC) 10 MG tablet Take 10 mg by mouth daily.    Marland Kitchen. docusate sodium (COLACE) 100 MG capsule Take 1 capsule (100 mg total) by mouth 2 (two) times daily. 30 capsule 0  . metoprolol tartrate (LOPRESSOR) 25 MG tablet Take 25 mg by mouth 2 (two) times daily.   1  . oxyCODONE-acetaminophen (PERCOCET) 5-325 MG tablet Take 1-2 tablets by mouth every 4 (four) hours as needed for severe pain. 40 tablet 0  . ranitidine (ZANTAC) 150 MG tablet Take 150 mg by mouth daily.   3  . tiZANidine (ZANAFLEX) 4 MG tablet Take 1 tablet (4 mg total) by mouth every 6 (six) hours as needed for muscle spasms. 40 tablet 1   No current facility-administered medications for this visit.     REVIEW OF SYSTEMS:  [X]  denotes positive finding, [ ]  denotes negative finding Vascular    Leg swelling    Cardiac    Chest pain or chest pressure:    Shortness of breath upon exertion:     Short of breath when lying flat:    Irregular heart rhythm:    Constitutional    Fever or chills:     PHYSICAL EXAM:   HR 62  BP 122/60  RR 16   GENERAL: The patient is a well-nourished female, in no acute distress. The vital signs are documented above. CARDIOVASCULAR: There is a regular rate and rhythm. PULMONARY: There is good air exchange bilaterally without wheezing or rales. VASCULAR: The patient has moderate bruising along her medial right thigh where she underwent endovenous laser ablation. There is no significant right lower extremity swelling.  DATA:   DUPLEX: I have independently interpreted her duplex scan today which shows successful closure of the right great saphenous vein up to approximately 0.6 cm from the saphenofemoral junction.  There is no evidence of DVT.  MEDICAL ISSUES:   STATUS POST ENDOVENOUS LASER ABLATION RIGHT GREAT SAPHENOUS VEIN: Patient is doing well status post endovenous laser ablation of the right great saphenous vein.  She is continued to have some discomfort related to her telangiectasias and reticular veins in the right leg.  I will have her return in 3 months to see how she is doing.  If she still having issues I think she would be a good candidate for sclerotherapy.  Waverly Ferrarihristopher Dickson [facility]  of [employer] [phone]

## 2018-05-28 MED FILL — METOPROLOL TARTRATE 25 MG T: 25 | 90 days supply | Qty: 90 | Fill #1

## 2018-07-16 MED FILL — SHINGRIX 50 MCG SUS: 50 | 1 days supply | Qty: 1 | Fill #1

## 2018-08-08 ENCOUNTER — Ambulatory Visit: Payer: No Typology Code available for payment source | Admitting: Vascular Surgery

## 2018-08-15 ENCOUNTER — Ambulatory Visit (INDEPENDENT_AMBULATORY_CARE_PROVIDER_SITE_OTHER): Payer: No Typology Code available for payment source | Admitting: Vascular Surgery

## 2018-08-15 ENCOUNTER — Encounter: Payer: Self-pay | Admitting: Vascular Surgery

## 2018-08-15 ENCOUNTER — Other Ambulatory Visit: Payer: Self-pay

## 2018-08-15 VITALS — BP 112/74 | HR 91 | Temp 97.0°F | Resp 14 | Ht 63.0 in | Wt 124.7 lb

## 2018-08-15 DIAGNOSIS — I83893 Varicose veins of bilateral lower extremities with other complications: Secondary | ICD-10-CM

## 2018-08-15 NOTE — Progress Notes (Signed)
Patient name: Samantha Curtis MRN: [id number] DOB: Mar 11, 1966 Sex: female  REASON FOR VISIT:   Follow-up of venous disease.  HPI:   Eiko S Goosby is a pleasant 53 y.o. female who is undergone previous laser ablation of the right great saphenous vein.  She also has telangiectasias and reticular veins in her right leg and returns for 62-month follow-up visit.  Her leg swelling has improved since the procedure and she has less aching pain in the right leg at work.  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  She is continuing to have significant pain in the spider veins and reticular veins in her right thigh and leg.  The symptoms are aggravated by sitting and standing and relieved somewhat with elevation.  She also has some hypersensitivity in the telangiectasias are somewhat tender to palpation at times.  She continues to wear her compression stockings faithfully at work.  She elevates her legs at the end of the day.  She takes ibuprofen as needed for pain.  Current Outpatient Medications  Medication Sig Dispense Refill  . aspirin EC 81 MG tablet Take 81 mg by mouth daily.    . cetirizine (ZYRTEC) 10 MG tablet Take 10 mg by mouth daily.    Marland Kitchen docusate sodium (COLACE) 100 MG capsule Take 1 capsule (100 mg total) by mouth 2 (two) times daily. 30 capsule 0  . metoprolol tartrate (LOPRESSOR) 25 MG tablet Take 25 mg by mouth 2 (two) times daily.   1  . ranitidine (ZANTAC) 150 MG tablet Take 150 mg by mouth daily.   3  . bisacodyl (DULCOLAX) 5 MG EC tablet Take 1 tablet (5 mg total) by mouth daily as needed for moderate constipation. (Patient not taking: Reported on 08/15/2018) 10 tablet 0  . oxyCODONE-acetaminophen (PERCOCET) 5-325 MG tablet Take 1-2 tablets by mouth every 4 (four) hours as needed for severe pain. (Patient not taking: Reported on 08/15/2018) 40 tablet 0   No current facility-administered medications for this visit.     REVIEW OF SYSTEMS:  [X]  denotes positive  finding, [ ]  denotes negative finding Vascular    Leg swelling    Cardiac    Chest pain or chest pressure:    Shortness of breath upon exertion:    Short of breath when lying flat:    Irregular heart rhythm:    Constitutional    Fever or chills:     PHYSICAL EXAM:   Vitals:   08/15/18 1451  BP: 112/74  Pulse: 91  Resp: 14  Temp: (!) 97 F (36.1 C)  TempSrc: Oral  SpO2: 97%  Weight: 124 lb 10.7 oz (56.5 kg)  Height: 5\' 3"  (1.6 m)   GENERAL: The patient is a well-nourished female, in no acute distress. The vital signs are documented above. CARDIOVASCULAR: There is a regular rate and rhythm. PULMONARY: There is good air exchange bilaterally without wheezing or rales. VASCULAR: She has no significant right lower extremity swelling.  She has multiple telangiectasias in her anterior thigh and leg.  She also has some telangiectasias and reticular veins in her posterior thigh and leg on the right.  She has no evidence of phlebitis.  DATA:   No new data.  MEDICAL ISSUES:   CHRONIC VENOUS INSUFFICIENCY: This patient is undergone successful endovenous laser ablation of the right great saphenous vein.  She does have some persistent telangiectasias and reticular veins of the right leg which are quite bothersome.  She  spends a lot of time on her feet and the symptoms can become disabling.  She is continuing conservative treatment with elevation, compression, and ibuprofen as needed for pain.  I think she would be a good candidate for sclerotherapy and we will try to arrange this in the near future.  Waverly Ferrari [facility] [phone]

## 2018-08-29 MED FILL — METOPROLOL TARTRATE 25 MG T: 25 | 90 days supply | Qty: 90 | Fill #2

## 2018-11-25 MED FILL — SF 5000 PLUS CREAM: 1.1 | 30 days supply | Qty: 51 | Fill #0

## 2018-11-25 MED FILL — METOPROLOL TARTRATE 25 MG T: 25 | 90 days supply | Qty: 90 | Fill #3

## 2019-01-07 ENCOUNTER — Ambulatory Visit: Payer: Self-pay

## 2019-01-07 ENCOUNTER — Other Ambulatory Visit: Payer: Self-pay

## 2019-01-07 ENCOUNTER — Other Ambulatory Visit: Payer: Self-pay | Admitting: Family Medicine

## 2019-01-07 DIAGNOSIS — M25511 Pain in right shoulder: Secondary | ICD-10-CM

## 2019-01-21 ENCOUNTER — Other Ambulatory Visit: Payer: Self-pay

## 2019-01-21 ENCOUNTER — Ambulatory Visit: Payer: PRIVATE HEALTH INSURANCE | Attending: Orthopaedic Surgery | Admitting: Physical Therapy

## 2019-01-21 ENCOUNTER — Encounter: Payer: Self-pay | Admitting: Physical Therapy

## 2019-01-21 DIAGNOSIS — M62838 Other muscle spasm: Secondary | ICD-10-CM | POA: Diagnosis present

## 2019-01-21 DIAGNOSIS — M25511 Pain in right shoulder: Secondary | ICD-10-CM | POA: Insufficient documentation

## 2019-01-21 DIAGNOSIS — M6281 Muscle weakness (generalized): Secondary | ICD-10-CM | POA: Insufficient documentation

## 2019-01-21 DIAGNOSIS — M25611 Stiffness of right shoulder, not elsewhere classified: Secondary | ICD-10-CM | POA: Diagnosis present

## 2019-01-21 NOTE — Patient Instructions (Signed)
Access Code: [email or web address]  URL: [email or web address]  Date: 01/21/2019  Prepared by: Jari Favre   Exercises  Supine Shoulder Flexion Extension AAROM with Dowel - 15 reps - 1 sets - 1x daily - 7x weekly  Seated Shoulder Abduction AAROM with Dowel - 15 reps - 1 sets - 1x daily - 7x weekly  Seated Cervical Sidebending Stretch - 3 reps - 1 sets - 20 sec hold - 1x daily - 7x weekly  Seated Bilateral Shoulder External Rotation with Resistance - 10 reps - 3 sets - 1x daily - 7x weekly

## 2019-01-21 NOTE — Therapy (Signed)
[facility] [address], [address] [place], [place], [address] Phone: [phone]   Fax:  [phone]  Physical Therapy Evaluation  Patient Details  Name: Samantha Curtis MRN: [id number] Date of Birth: [birth year] Referring Provider (PT): Marcene Corningalldorf, Peter, MD   Encounter Date: 01/21/2019  PT End of Session - 01/21/19 1355    Visit Number  1    Date for PT Re-Evaluation  03/18/19    PT Start Time  1355    PT Stop Time  1439    PT Time Calculation (min)  44 min    Activity Tolerance  Patient tolerated treatment well       Past Medical History:  Diagnosis Date  . Arthritis    "knees" (07/04/2017)  . Dysrhythmia    PALPITATIONS  . GERD (gastroesophageal reflux disease)   . High cholesterol   . Leg pain     Past Surgical History:  Procedure Laterality Date  . ENDOVENOUS ABLATION SAPHENOUS VEIN W/ LASER Left 2005   [facility]  . JOINT REPLACEMENT    . KNEE ARTHROSCOPY Right   . SHOULDER ARTHROSCOPY Right    "spurs"  . TOTAL KNEE ARTHROPLASTY Bilateral 07/03/2017   Procedure: TOTAL KNEE BILATERAL;  Surgeon: Marcene Corningalldorf, Peter, MD;  Location: [facility];  Service: Orthopedics;  Laterality: Bilateral;  . TUBAL LIGATION    . VAGINAL HYSTERECTOMY      There were no vitals filed for this visit.   Subjective Assessment - 01/21/19 1402    Subjective  Pt was moving and patient who was very large. Certain motion aggravate it and then pain lasts a few hours.  Right now it is better from the injection but feels a little tender.  I am having muscle spasm around the scapula now since this happened.    Limitations  House hold activities;Lifting    Diagnostic tests  x-ray    Patient Stated Goals  be able to do what I need to do without pain and make sure there is no tear    Currently in Pain?  Yes    Pain Score  2    now it's better after the injection; almost 10 before injection   Pain Location  Shoulder    Pain Orientation   Right;Lateral;Posterior         [facility] PT Assessment - 01/21/19 0001      Assessment   Medical Diagnosis  M25.511 (ICD-10-CM) - Right shoulder pain    Referring Provider (PT)  Marcene Corningalldorf, Peter, MD    Onset Date/Surgical Date  12/06/18    Prior Therapy  No      Precautions   Precautions  None      Restrictions   Weight Bearing Restrictions  No      Balance Screen   Has the patient fallen in the past 6 months  No      Home Environment   Living Environment  Private residence    Living Arrangements  Spouse/significant other      Prior Function   Level of Independence  Independent    Vocation  Full time employment    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      Cognition   Overall Cognitive Status  Within Functional Limits for tasks assessed      Posture/Postural Control   Posture/Postural Control  Postural limitations    Postural Limitations  Increased thoracic kyphosis;Rounded Shoulders      ROM / Strength   AROM /  PROM / Strength  AROM;PROM;Strength      AROM   Overall AROM Comments  142 Rt flex; 160 let flex: Lt IR T6; Rt T12      Strength   Overall Strength Comments  left shoulder 5/5; positions away from the body with some ER seem to be the worst    Strength Assessment Site  Shoulder    Right/Left Shoulder  Right    Right Shoulder Flexion  4+/5    Right Shoulder ABduction  4+/5    Right Shoulder External Rotation  4+/5      Palpation   Palpation comment  Right upper traps, rhomboids and pecs tight with tender trigger points throughout rhomboids; humeral head feels to be positioned anterior in fossa      Special Tests    Special Tests  Rotator Cuff Impingement    Rotator Cuff Impingment tests  Neer impingement test;Hawkins- Kennedy test;Full Can test      Neer Impingement test    Findings  Positive    Side  Right      Hawkins-Kennedy test   Findings  Positive    Side  Right      Full Can test   Side  Right    Comment  unclear  results, had pain in both full and empty can positions      Ambulation/Gait   Gait Pattern  Within Functional Limits                Objective measurements completed on examination: See above findings.      [facility] Adult PT Treatment/Exercise - 01/21/19 0001      Self-Care   Self-Care  Other Self-Care Comments    Other Self-Care Comments   educated and performed initial HEP; attemtp pec stretch in doorway but increased pain             PT Education - 01/21/19 1446    Education Details  Access Code: [email or web address]    Person(s) Educated  Patient    Methods  Explanation;Demonstration;Handout;Verbal cues;Tactile cues    Comprehension  Verbalized understanding;Returned demonstration       PT Short Term Goals - 01/21/19 1504      PT SHORT TERM GOAL #1   Title  independent with initial HEP    Time  4    Period  Weeks    Status  New    Target Date  02/18/19      PT SHORT TERM GOAL #2   Title  AROM Rt shoulder flexion 120    Time  4    Period  Weeks    Status  New    Target Date  02/18/19      PT SHORT TERM GOAL #3   Title  ...      PT SHORT TERM GOAL #4   Title  ...      PT SHORT TERM GOAL #5   Title  ...        PT Long Term Goals - 01/21/19 1505      PT LONG TERM GOAL #1   Title  Independent with advanced HEP    Time  8    Period  Weeks    Status  New    Target Date  03/18/19      PT LONG TERM GOAL #2   Title  increased AROM to 160 deg Rt shoulder flexion without increased pain    Time  8    Period  Weeks  Status  New    Target Date  03/18/19      PT LONG TERM GOAL #3   Title  pt will be able to perform MMT Rt shoulder flexion and abduction 5/5 without pain for return to functional activities at work    Time  8    Period  Weeks    Status  New    Target Date  03/18/19      PT LONG TERM GOAL #4   Title  FOTO </= to 25% limited    Baseline  35% limited at eval    Time  8    Period  Weeks    Status  New    Target Date  03/18/19       PT LONG TERM GOAL #5   Title  Pt will be able to lift 20 lb up to waist height for return to full function at work    Time  8    Period  Weeks    Status  New    Target Date  03/18/19             Plan - 01/21/19 1446    Clinical Impression Statement  Pt presents to skilled PT due to Rt shoulder pain from injury at work.  She was lifting a heavy patient and is having pain with certain movements since then.  Pt demonsrates decreased active and passive ROM of Rt shoulder.  She has weakness in Rt shoulder as mentioned above.  Pt has increased thoracic kyphosis.  She is also getting muscle spasms throughout right shoulder girdle including pecs, upper trap, and rhomboids.  Pt will benefit from skilled PT to address impairments for successful healing of soft tissue and to avoid further injury.    Personal Factors and Comorbidities  Profession    Examination-Activity Limitations  Reach Overhead;Lift;Dressing;Caring for Others;Carry    Stability/Clinical Decision Making  Stable/Uncomplicated    Clinical Decision Making  Low    PT Frequency  2x / week    PT Duration  8 weeks    PT Treatment/Interventions  ADLs/Self Care Home Management;Biofeedback;Electrical Stimulation;Iontophoresis 4mg /ml Dexamethasone;Moist Heat;Ultrasound;Therapeutic activities;Therapeutic exercise;Neuromuscular re-education;Manual techniques;Passive range of motion;Dry needling;Taping;Patient/family education    PT Next Visit Plan  f/u on HEP, DN info given and DN to upper trap, rhomboid on Rt side    PT Home Exercise Plan  Access Code: [email or web address]    Consulted and Agree with Plan of Care  Patient       Patient will benefit from skilled therapeutic intervention in order to improve the following deficits and impairments:  Pain, Impaired UE functional use, Increased muscle spasms, Decreased range of motion, Decreased strength  Visit Diagnosis: 1. Acute pain of right shoulder   2. Muscle weakness (generalized)   3. Stiffness  of right shoulder, not elsewhere classified   4. Other muscle spasm        Problem List Patient Active Problem List   Diagnosis Date Noted  . S/P TKR (total knee replacement), bilateral   . Gastroesophageal reflux disease   . Post-operative pain   . Elevated blood pressure reading   . Bilateral primary osteoarthritis of knee 07/03/2017  . Lateral epicondylitis of right elbow 04/02/2013    Junious SilkJakki L Emonii Wienke, PT 01/21/2019, 4:43 PM  [facility] [address], [address] [place], [place], [address] Phone: [phone]   Fax:  [phone]  Name: Samantha Curtis MRN: [id number] Date of Birth: [birth year]

## 2019-01-23 ENCOUNTER — Encounter: Payer: Self-pay | Admitting: Physical Therapy

## 2019-01-23 ENCOUNTER — Ambulatory Visit: Payer: No Typology Code available for payment source | Admitting: Physical Therapy

## 2019-01-23 ENCOUNTER — Other Ambulatory Visit: Payer: Self-pay

## 2019-01-23 ENCOUNTER — Ambulatory Visit: Payer: PRIVATE HEALTH INSURANCE | Admitting: Physical Therapy

## 2019-01-23 DIAGNOSIS — M62838 Other muscle spasm: Secondary | ICD-10-CM

## 2019-01-23 DIAGNOSIS — M25611 Stiffness of right shoulder, not elsewhere classified: Secondary | ICD-10-CM

## 2019-01-23 DIAGNOSIS — M25511 Pain in right shoulder: Secondary | ICD-10-CM

## 2019-01-23 DIAGNOSIS — M6281 Muscle weakness (generalized): Secondary | ICD-10-CM

## 2019-01-23 MED FILL — ONDANSETRON HCL 8 MG TABLET: 8 | 10 days supply | Qty: 20 | Fill #0

## 2019-01-23 NOTE — Therapy (Signed)
[facility] [address], [place], [place], [address] Phone: [phone]   Fax:  [phone]  Physical Therapy Treatment  Patient Details  Name: Samantha Curtis MRN: [id number] Date of Birth: 08-19-1965 Referring Provider (PT): Melrose Nakayama, MD   Encounter Date: 01/23/2019  PT End of Session - 01/23/19 1549    Visit Number  2    Number of Visits  9    Date for PT Re-Evaluation  03/18/19    Authorization Type  W/C 9 visits approved    Authorization - Visit Number  2    Authorization - Number of Visits  9    PT Start Time  4401    PT Stop Time  0272    PT Time Calculation (min)  42 min       Past Medical History:  Diagnosis Date  . Arthritis    "knees" (07/04/2017)  . Dysrhythmia    PALPITATIONS  . GERD (gastroesophageal reflux disease)   . High cholesterol   . Leg pain     Past Surgical History:  Procedure Laterality Date  . ENDOVENOUS ABLATION SAPHENOUS VEIN W/ LASER Left 2005   [facility]  . JOINT REPLACEMENT    . KNEE ARTHROSCOPY Right   . SHOULDER ARTHROSCOPY Right    "spurs"  . TOTAL KNEE ARTHROPLASTY Bilateral 07/03/2017   Procedure: TOTAL KNEE BILATERAL;  Surgeon: Melrose Nakayama, MD;  Location: [facility];  Service: Orthopedics;  Laterality: Bilateral;  . TUBAL LIGATION    . VAGINAL HYSTERECTOMY      There were no vitals filed for this visit.  Subjective Assessment - 01/23/19 1512    Subjective  Did some of the exercises.  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  Had to take naproxen.   Superior shoulder.  Muscle spasms in scapular region.    Currently in Pain?  Yes    Pain Score  6     Pain Location  Shoulder    Pain Orientation  Right    Aggravating Factors   lifting patients at work;  reaching overhead                       [facility] Adult PT Treatment/Exercise - 01/23/19 0001      Shoulder Exercises: Supine   Other Supine Exercises  review  of initial HEP       Moist Heat Therapy   Number Minutes Moist Heat  5 Minutes    Moist Heat Location  Shoulder      Manual Therapy   Joint Mobilization  glenohumeral distraction grade 2 30x; inferior and posterior grade 1 oscillations 30x each   very sensitive   Soft tissue mobilization  right upper traps, levator, infraspinatus, pectoral region     Kinesiotex  Facilitate Muscle      Kinesiotix   Facilitate Muscle   2 vertical strips anterior and posterior deltoid and 1 horizontal strip distal deltoid insertion      Neck Exercises: Stretches   Other Neck Stretches  levator stretch 3x 20 sec right        Trigger Point Dry Needling - 01/23/19 0001    Consent Given?  Yes    Education Handout Provided  Yes    Muscles Treated Head and Neck  Upper trapezius;Levator scapulae    Muscles Treated Upper Quadrant  Infraspinatus    Upper Trapezius Response  Twitch reponse elicited;Palpable increased muscle length  Levator Scapulae Response  Twitch response elicited;Palpable increased muscle length    Infraspinatus Response  Palpable increased muscle length           PT Education - 01/23/19 1548    Education Details  Access Code: [email or web address]  posterior capsule stretch; levator scap stretch ; dry needle after care   Person(s) Educated  Patient    Methods  Explanation;Demonstration;Handout    Comprehension  Returned demonstration;Verbalized understanding       PT Short Term Goals - 01/21/19 1504      PT SHORT TERM GOAL #1   Title  independent with initial HEP    Time  4    Period  Weeks    Status  New    Target Date  02/18/19      PT SHORT TERM GOAL #2   Title  AROM Rt shoulder flexion 120    Time  4    Period  Weeks    Status  New    Target Date  02/18/19      PT SHORT TERM GOAL #3   Title  ...      PT SHORT TERM GOAL #4   Title  ...      PT SHORT TERM GOAL #5   Title  ...        PT Long Term Goals - 01/21/19 1505      PT LONG TERM GOAL #1   Title   Independent with advanced HEP    Time  8    Period  Weeks    Status  New    Target Date  03/18/19      PT LONG TERM GOAL #2   Title  increased AROM to 160 deg Rt shoulder flexion without increased pain    Time  8    Period  Weeks    Status  New    Target Date  03/18/19      PT LONG TERM GOAL #3   Title  pt will be able to perform MMT Rt shoulder flexion and abduction 5/5 without pain for return to functional activities at work    Time  8    Period  Weeks    Status  New    Target Date  03/18/19      PT LONG TERM GOAL #4   Title  FOTO </= to 25% limited    Baseline  35% limited at eval    Time  8    Period  Weeks    Status  New    Target Date  03/18/19      PT LONG TERM GOAL #5   Title  Pt will be able to lift 20 lb up to waist height for return to full function at work    Time  8    Period  Weeks    Status  New    Target Date  03/18/19            Plan - 01/23/19 1543    Clinical Impression Statement  The patient reports more pain today than on initial visit secondary to a heavier workload today.  She reports a primary complaint of right anterior and superior shoulder pain but also night time posterior shoulder spasms.  She has high symptom sensitivity with glenohumeral joint mobs even with subtle oscillations.  Multiple tender points in upper traps and levator as well as high pain sensitivity in infraspinatus muscle.  Painful with dry needling but much improved soft  tissue length following.  Good initial response with kinesiotape (patient did well with KT for knees in the past and has tape at home).  Therapist closely monitoring response with all treatment interventions and modifying for pain as needed.    Examination-Activity Limitations  Reach Overhead;Lift;Dressing;Caring for Others;Carry    PT Frequency  2x / week    PT Duration  8 weeks    PT Treatment/Interventions  ADLs/Self Care Home Management;Biofeedback;Electrical Stimulation;Iontophoresis 4mg /ml  Dexamethasone;Moist Heat;Ultrasound;Therapeutic activities;Therapeutic exercise;Neuromuscular re-education;Manual techniques;Passive range of motion;Dry needling;Taping;Patient/family education    PT Next Visit Plan  assess response to DN of upper trap, levator scap and infraspinatus;  KT;  initiate scapular strengthening low level in painfree ROM    PT Home Exercise Plan  Access Code: [email or web address]       Patient will benefit from skilled therapeutic intervention in order to improve the following deficits and impairments:  Pain, Impaired UE functional use, Increased muscle spasms, Decreased range of motion, Decreased strength  Visit Diagnosis: 1. Acute pain of right shoulder   2. Muscle weakness (generalized)   3. Stiffness of right shoulder, not elsewhere classified   4. Other muscle spasm        Problem List Patient Active Problem List   Diagnosis Date Noted  . S/P TKR (total knee replacement), bilateral   . Gastroesophageal reflux disease   . Post-operative pain   . Elevated blood pressure reading   . Bilateral primary osteoarthritis of knee 07/03/2017  . Lateral epicondylitis of right elbow 04/02/2013   Lavinia SharpsStacy Simpson, PT 01/23/19 5:27 PM Phone: [phone] Fax: [phone] Vivien PrestoSimpson, Stacy C 01/23/2019, 5:26 PM  [facility] [address], [address] [place], [place], [address] Phone: [phone]   Fax:  [phone]  Name: Samantha Curtis MRN: [id number] Date of Birth: 02-Sep-1965

## 2019-01-23 NOTE — Patient Instructions (Addendum)
Trigger Point Dry Needling  . What is Trigger Point Dry Needling (DN)? o DN is a physical therapy technique used to treat muscle pain and dysfunction. Specifically, DN helps deactivate muscle trigger points (muscle knots).  o A thin filiform needle is used to penetrate the skin and stimulate the underlying trigger point. The goal is for a local twitch response (LTR) to occur and for the trigger point to relax. No medication of any kind is injected during the procedure.   . What Does Trigger Point Dry Needling Feel Like?  o The procedure feels different for each individual patient. Some patients report that they do not actually feel the needle enter the skin and overall the process is not painful. Very mild bleeding may occur. However, many patients feel a deep cramping in the muscle in which the needle was inserted. This is the local twitch response.   Marland Kitchen How Will I feel after the treatment? o Soreness is normal, and the onset of soreness may not occur for a few hours. Typically this soreness does not last longer than two days.  o Bruising is uncommon, however; ice can be used to decrease any possible bruising.  o In rare cases feeling tired or nauseous after the treatment is normal. In addition, your symptoms may get worse before they get better, this period will typically not last longer than 24 hours.   . What Can I do After My Treatment? o Increase your hydration by drinking more water for the next 24 hours. o You may place ice or heat on the areas treated that have become sore, however, do not use heat on inflamed or bruised areas. Heat often brings more relief post needling. o You can continue your regular activities, but vigorous activity is not recommended initially after the treatment for 24 hours. o DN is best combined with other physical therapy such as strengthening, stretching, and other therapies.       Access Code: [email or web address]  URL: [email or web address]  Date:  01/23/2019  Prepared by: Ruben Im   Exercises  Supine Shoulder Flexion Extension AAROM with Dowel - 15 reps - 1 sets - 1x daily - 7x weekly  Seated Shoulder Abduction AAROM with Dowel - 15 reps - 1 sets - 1x daily - 7x weekly  Seated Cervical Sidebending Stretch - 3 reps - 1 sets - 20 sec hold - 1x daily - 7x weekly  Seated Bilateral Shoulder External Rotation with Resistance - 10 reps - 3 sets - 1x daily - 7x weekly  Standing Cross Body Shoulder Stretch at Wall - 3 reps - 1 sets - 20 hold - 1x daily - 7x weekly  Gentle Levator Scapulae Stretch - 3 reps - 1 sets - 20 hold - 1x daily - 7x weekly   [facility] [address] [place], [place] [address] Phone # [phone] Fax [phone]

## 2019-01-29 ENCOUNTER — Ambulatory Visit: Payer: PRIVATE HEALTH INSURANCE | Admitting: Physical Therapy

## 2019-01-29 ENCOUNTER — Other Ambulatory Visit: Payer: Self-pay

## 2019-01-29 ENCOUNTER — Encounter: Payer: Self-pay | Admitting: Physical Therapy

## 2019-01-29 DIAGNOSIS — M6281 Muscle weakness (generalized): Secondary | ICD-10-CM

## 2019-01-29 DIAGNOSIS — M62838 Other muscle spasm: Secondary | ICD-10-CM

## 2019-01-29 DIAGNOSIS — M25611 Stiffness of right shoulder, not elsewhere classified: Secondary | ICD-10-CM

## 2019-01-29 DIAGNOSIS — M25511 Pain in right shoulder: Secondary | ICD-10-CM

## 2019-01-29 NOTE — Therapy (Signed)
[facility] [address], [address] [place], [place], [address] Phone: [phone]   Fax:  [phone]  Physical Therapy Treatment  Patient Details  Name: Samantha Curtis Date of Birth: [birth year] Referring Provider (PT): Marcene Corningalldorf, Peter, MD   Encounter Date: 01/29/2019  PT End of Session - 01/29/19 1617    Visit Number  3    Number of Visits  9    Date for PT Re-Evaluation  03/18/19    Authorization Type  W/C 9 visits approved    Authorization - Visit Number  3    Authorization - Number of Visits  9    PT Start Time  1617    PT Stop Time  1657    PT Time Calculation (min)  40 min    Activity Tolerance  Patient tolerated treatment well    Behavior During Therapy  [facility] for tasks assessed/performed       Past Medical History:  Diagnosis Date  . Arthritis    "knees" (07/04/2017)  . Dysrhythmia    PALPITATIONS  . GERD (gastroesophageal reflux disease)   . High cholesterol   . Leg pain     Past Surgical History:  Procedure Laterality Date  . ENDOVENOUS ABLATION SAPHENOUS VEIN W/ LASER Left 2005   [facility]  . JOINT REPLACEMENT    . KNEE ARTHROSCOPY Right   . SHOULDER ARTHROSCOPY Right    "spurs"  . TOTAL KNEE ARTHROPLASTY Bilateral 07/03/2017   Procedure: TOTAL KNEE BILATERAL;  Surgeon: Marcene Corningalldorf, Peter, MD;  Location: [facility];  Service: Orthopedics;  Laterality: Bilateral;  . TUBAL LIGATION    . VAGINAL HYSTERECTOMY      There were no vitals filed for this visit.                    [facility] Adult PT Treatment/Exercise - 01/29/19 0001      Iontophoresis   Type of Iontophoresis  Dexamethasone   #1   Location  Suprspinatus insertion RT shoulder    Dose  1 ml    Time  6 hr wear, skin intact      Manual Therapy   Soft tissue mobilization  right upper traps, levator, infraspinatus, pectoral region    Upper trap stretching with tissue elongation            PT Education -  01/29/19 1644    Education Details  HEP progression: standing rows & extension with red band    Person(s) Educated  Patient    Methods  Explanation;Demonstration;Tactile cues;Verbal cues;Handout    Comprehension  Verbalized understanding;Returned demonstration       PT Short Term Goals - 01/21/19 1504      PT SHORT TERM GOAL #1   Title  independent with initial HEP    Time  4    Period  Weeks    Status  New    Target Date  02/18/19      PT SHORT TERM GOAL #2   Title  AROM Rt shoulder flexion 120    Time  4    Period  Weeks    Status  New    Target Date  02/18/19      PT SHORT TERM GOAL #3   Title  ...      PT SHORT TERM GOAL #4   Title  ...      PT SHORT TERM GOAL #5   Title  .Marland Kitchen..Marland Kitchen  PT Long Term Goals - 01/21/19 1505      PT LONG TERM GOAL #1   Title  Independent with advanced HEP    Time  8    Period  Weeks    Status  New    Target Date  03/18/19      PT LONG TERM GOAL #2   Title  increased AROM to 160 deg Rt shoulder flexion without increased pain    Time  8    Period  Weeks    Status  New    Target Date  03/18/19      PT LONG TERM GOAL #3   Title  pt will be able to perform MMT Rt shoulder flexion and abduction 5/5 without pain for return to functional activities at work    Time  8    Period  Weeks    Status  New    Target Date  03/18/19      PT LONG TERM GOAL #4   Title  FOTO </= to 25% limited    Baseline  35% limited at eval    Time  8    Period  Weeks    Status  New    Target Date  03/18/19      PT LONG TERM GOAL #5   Title  Pt will be able to lift 20 lb up to waist height for return to full function at work    Time  8    Period  Weeks    Status  New    Target Date  03/18/19            Plan - 01/29/19 1644    Clinical Impression Statement  Pt arrives with no current pain, but over the weekend had bad spams in her RT upper trap. Pt continues to have active trigger points in the upper trap and levator, soft tissue work and TP  release helped loosened the muscles. She was given shoulder rows and extensions to progress HEP. Pt has red band at home. Started ionto patch to Rt supraspinatus tendon. pt has had good response to ionto in the past.    PT Treatment/Interventions  ADLs/Self Care Home Management;Biofeedback;Electrical Stimulation;Iontophoresis 4mg /ml Dexamethasone;Moist Heat;Ultrasound;Therapeutic activities;Therapeutic exercise;Neuromuscular re-education;Manual techniques;Passive range of motion;Dry needling;Taping;Patient/family education    PT Next Visit Plan  DN next, review band ex and continue scap strength, assess ionto #1 and do #2 if pt 24 hours from todays patch.    PT Home Exercise Plan  Access Code: [email or web address]    Consulted and Agree with Plan of Care  Patient       Patient will benefit from skilled therapeutic intervention in order to improve the following deficits and impairments:  Pain, Impaired UE functional use, Increased muscle spasms, Decreased range of motion, Decreased strength  Visit Diagnosis: 1. Acute pain of right shoulder   2. Muscle weakness (generalized)   3. Stiffness of right shoulder, not elsewhere classified   4. Other muscle spasm        Problem List Patient Active Problem List   Diagnosis Date Noted  . S/P TKR (total knee replacement), bilateral   . Gastroesophageal reflux disease   . Post-operative pain   . Elevated blood pressure reading   . Bilateral primary osteoarthritis of knee 07/03/2017  . Lateral epicondylitis of right elbow 04/02/2013    Courtenay Creger, PTA 01/29/2019, 5:10 PM  [facility] [address], [address] [place], [place], [address] Phone: [phone]  Fax:  [phone]  Name: Samantha Curtis MRN: [id number] Date of Birth: [birth year]

## 2019-01-29 NOTE — Patient Instructions (Signed)

## 2019-01-30 ENCOUNTER — Ambulatory Visit: Payer: PRIVATE HEALTH INSURANCE | Admitting: Physical Therapy

## 2019-01-30 ENCOUNTER — Encounter: Payer: Self-pay | Admitting: Physical Therapy

## 2019-01-30 DIAGNOSIS — M6281 Muscle weakness (generalized): Secondary | ICD-10-CM

## 2019-01-30 DIAGNOSIS — M25611 Stiffness of right shoulder, not elsewhere classified: Secondary | ICD-10-CM

## 2019-01-30 DIAGNOSIS — M62838 Other muscle spasm: Secondary | ICD-10-CM

## 2019-01-30 DIAGNOSIS — M25511 Pain in right shoulder: Secondary | ICD-10-CM

## 2019-01-30 NOTE — Therapy (Signed)
[facility] [address], [place], [place], [address] Phone: [phone]   Fax:  [phone]  Physical Therapy Treatment  Patient Details  Name: Samantha Curtis MRN: [id number] Date of Birth: 1966-05-28 Referring Provider (PT): Melrose Nakayama, MD   Encounter Date: 01/30/2019  PT End of Session - 01/30/19 1708    Visit Number  4    Number of Visits  9    Date for PT Re-Evaluation  03/18/19    Authorization Type  W/C 9 visits approved    Authorization - Visit Number  4    Authorization - Number of Visits  9    PT Start Time  1517    PT Stop Time  1658    PT Time Calculation (min)  39 min    Activity Tolerance  Patient tolerated treatment well    Behavior During Therapy  [facility] for tasks assessed/performed       Past Medical History:  Diagnosis Date  . Arthritis    "knees" (07/04/2017)  . Dysrhythmia    PALPITATIONS  . GERD (gastroesophageal reflux disease)   . High cholesterol   . Leg pain     Past Surgical History:  Procedure Laterality Date  . ENDOVENOUS ABLATION SAPHENOUS VEIN W/ LASER Left 2005   [facility]  . JOINT REPLACEMENT    . KNEE ARTHROSCOPY Right   . SHOULDER ARTHROSCOPY Right    "spurs"  . TOTAL KNEE ARTHROPLASTY Bilateral 07/03/2017   Procedure: TOTAL KNEE BILATERAL;  Surgeon: Melrose Nakayama, MD;  Location: [place];  Service: Orthopedics;  Laterality: Bilateral;  . TUBAL LIGATION    . VAGINAL HYSTERECTOMY      There were no vitals filed for this visit.  Subjective Assessment - 01/30/19 1623    Subjective  I feel better than I did last time I was here.  It has been really busy at work and my husband is in the hospital with pneumonia    Patient Stated Goals  be able to do what I need to do without pain and make sure there is no tear    Currently in Pain?  Yes    Pain Score  3     Pain Location  Shoulder    Pain Orientation  Right                       [facility] Adult  PT Treatment/Exercise - 01/30/19 0001      Shoulder Exercises: Supine   Flexion  AAROM;Right;Left;10 reps    Flexion Limitations  towel roll behind back for thoracic extension stretch       Shoulder Exercises: Seated   Other Seated Exercises  thoracic rotation with flexion arms crossed - 3 x 10 sec      Shoulder Exercises: Standing   Extension  Strengthening;Right;Left;20 reps;Theraband    Theraband Level (Shoulder Extension)  Level 2 (Red)    Row  Strengthening;Right;Left;20 reps;Theraband    Theraband Level (Shoulder Row)  Level 2 (Red)      Manual Therapy   Soft tissue mobilization  right upper traps, levator, supraspinatus, infraspinatus, pectoral region    Upper trap stretching with tissue elongation     Neck Exercises: Stretches   Chest Stretch  3 reps;30 seconds       Trigger Point Dry Needling - 01/30/19 0001    Consent Given?  Yes    Education Handout Provided  Previously provided    Muscles Treated Head  and Neck  Upper trapezius;Levator scapulae    Muscles Treated Upper Quadrant  Supraspinatus    Upper Trapezius Response  Twitch reponse elicited;Palpable increased muscle length    Levator Scapulae Response  Twitch response elicited;Palpable increased muscle length    Supraspinatus Response  Twitch response elicited;Palpable increased muscle length             PT Short Term Goals - 01/21/19 1504      PT SHORT TERM GOAL #1   Title  independent with initial HEP    Time  4    Period  Weeks    Status  New    Target Date  02/18/19      PT SHORT TERM GOAL #2   Title  AROM Rt shoulder flexion 120    Time  4    Period  Weeks    Status  New    Target Date  02/18/19      PT SHORT TERM GOAL #3   Title  ...      PT SHORT TERM GOAL #4   Title  ...      PT SHORT TERM GOAL #5   Title  ...        PT Long Term Goals - 01/21/19 1505      PT LONG TERM GOAL #1   Title  Independent with advanced HEP    Time  8    Period  Weeks    Status  New    Target  Date  03/18/19      PT LONG TERM GOAL #2   Title  increased AROM to 160 deg Rt shoulder flexion without increased pain    Time  8    Period  Weeks    Status  New    Target Date  03/18/19      PT LONG TERM GOAL #3   Title  pt will be able to perform MMT Rt shoulder flexion and abduction 5/5 without pain for return to functional activities at work    Time  8    Period  Weeks    Status  New    Target Date  03/18/19      PT LONG TERM GOAL #4   Title  FOTO </= to 25% limited    Baseline  35% limited at eval    Time  8    Period  Weeks    Status  New    Target Date  03/18/19      PT LONG TERM GOAL #5   Title  Pt will be able to lift 20 lb up to waist height for return to full function at work    Time  8    Period  Weeks    Status  New    Target Date  03/18/19            Plan - 01/30/19 1709    Clinical Impression Statement  Pt had less pain than she had yesterday.  Pt needs some cues with band exercises to "lift her heart"  She has increased thoracic kyphosis during exercise but is able to correct with cues.  Pt responded well to trigger point release and dry needling for increaed soft tissue length.  Followed up by additional stretches that felt good . Ionto was held today due to redness where she had the patch yesterday.   Pt will benefit from skilled PT to continue current POC.    PT Treatment/Interventions  ADLs/Self Care  Home Management;Biofeedback;Electrical Stimulation;Iontophoresis 4mg /ml Dexamethasone;Moist Heat;Ultrasound;Therapeutic activities;Therapeutic exercise;Neuromuscular re-education;Manual techniques;Passive range of motion;Dry needling;Taping;Patient/family education    PT Next Visit Plan  f/u on DN next, review and progress band ex and continue scap strength, ionto #2    PT Home Exercise Plan  Access Code: [email or web address]    Consulted and Agree with Plan of Care  Patient       Patient will benefit from skilled therapeutic intervention in order to improve the  following deficits and impairments:  Pain, Impaired UE functional use, Increased muscle spasms, Decreased range of motion, Decreased strength  Visit Diagnosis: 1. Acute pain of right shoulder   2. Muscle weakness (generalized)   3. Stiffness of right shoulder, not elsewhere classified   4. Other muscle spasm        Problem List Patient Active Problem List   Diagnosis Date Noted  . S/P TKR (total knee replacement), bilateral   . Gastroesophageal reflux disease   . Post-operative pain   . Elevated blood pressure reading   . Bilateral primary osteoarthritis of knee 07/03/2017  . Lateral epicondylitis of right elbow 04/02/2013    Junious SilkJakki L Ermon Sagan, PT 01/30/2019, 5:16 PM  [facility] [address], [address] [place], [place], [address] Phone: [phone]   Fax:  [phone]  Name: Gevena MartRana S Curtis MRN: [id number] Date of Birth: 1965-10-20

## 2019-02-05 ENCOUNTER — Ambulatory Visit: Payer: PRIVATE HEALTH INSURANCE | Admitting: Physical Therapy

## 2019-02-05 ENCOUNTER — Encounter: Payer: Self-pay | Admitting: Physical Therapy

## 2019-02-05 ENCOUNTER — Other Ambulatory Visit: Payer: Self-pay

## 2019-02-05 DIAGNOSIS — M25511 Pain in right shoulder: Secondary | ICD-10-CM | POA: Diagnosis not present

## 2019-02-05 DIAGNOSIS — M25611 Stiffness of right shoulder, not elsewhere classified: Secondary | ICD-10-CM

## 2019-02-05 DIAGNOSIS — M6281 Muscle weakness (generalized): Secondary | ICD-10-CM

## 2019-02-05 DIAGNOSIS — M62838 Other muscle spasm: Secondary | ICD-10-CM

## 2019-02-05 NOTE — Therapy (Signed)
[facility] [address], [address] [place], [place], [address] Phone: [phone]   Fax:  [phone]  Physical Therapy Treatment  Patient Details  Name: Samantha MartRana S Curtis MRN: [id number] Date of Birth: 12/02/1965 Referring Provider (PT): Marcene Corningalldorf, Peter, MD   Encounter Date: 02/05/2019  PT End of Session - 02/05/19 1509    Visit Number  5    Number of Visits  9    Date for PT Re-Evaluation  03/18/19    Authorization Type  W/C 9 visits approved    Authorization - Visit Number  5    Authorization - Number of Visits  9    PT Start Time  1509    PT Stop Time  1559    PT Time Calculation (min)  50 min    Activity Tolerance  Patient tolerated treatment well    Behavior During Therapy  [facility] for tasks assessed/performed       Past Medical History:  Diagnosis Date  . Arthritis    "knees" (07/04/2017)  . Dysrhythmia    PALPITATIONS  . GERD (gastroesophageal reflux disease)   . High cholesterol   . Leg pain     Past Surgical History:  Procedure Laterality Date  . ENDOVENOUS ABLATION SAPHENOUS VEIN W/ LASER Left 2005   [facility]  . JOINT REPLACEMENT    . KNEE ARTHROSCOPY Right   . SHOULDER ARTHROSCOPY Right    "spurs"  . TOTAL KNEE ARTHROPLASTY Bilateral 07/03/2017   Procedure: TOTAL KNEE BILATERAL;  Surgeon: Marcene Corningalldorf, Peter, MD;  Location: [facility];  Service: Orthopedics;  Laterality: Bilateral;  . TUBAL LIGATION    . VAGINAL HYSTERECTOMY      There were no vitals filed for this visit.  Subjective Assessment - 02/05/19 1606    Subjective  I feel worse than last time.    Patient Stated Goals  be able to do what I need to do without pain and make sure there is no tear    Currently in Pain?  Yes    Pain Score  6     Pain Location  Shoulder    Pain Orientation  Right    Pain Descriptors / Indicators  Aching    Pain Type  Acute pain    Pain Onset  More than a month ago    Pain Frequency  Intermittent    Aggravating  Factors   pushing and pulling    Multiple Pain Sites  No                       [facility] Adult PT Treatment/Exercise - 02/05/19 0001      Self-Care   Other Self-Care Comments   posture using mirror and verbal and tactile cues to correct upper cross syndrome      Modalities   Modalities  Electrical Stimulation      Moist Heat Therapy   Number Minutes Moist Heat  15 Minutes    Moist Heat Location  Shoulder      Electrical Stimulation   Electrical Stimulation Location  Rt shoulder    Electrical Stimulation Action  IFC    Electrical Stimulation Parameters  to tolerance    Electrical Stimulation Goals  Pain      Iontophoresis   Type of Iontophoresis  --    Location  --    Dose  --    Time  --      Manual Therapy   Soft tissue mobilization  Rt upper trap, scalenes, anterior deltoid               PT Short Term Goals - 02/05/19 1602      PT SHORT TERM GOAL #1   Title  independent with initial HEP    Baseline  cannot do sometimes due to pain    Status  On-going      PT SHORT TERM GOAL #2   Title  AROM Rt shoulder flexion 120    Status  On-going        PT Long Term Goals - 01/21/19 1505      PT LONG TERM GOAL #1   Title  Independent with advanced HEP    Time  8    Period  Weeks    Status  New    Target Date  03/18/19      PT LONG TERM GOAL #2   Title  increased AROM to 160 deg Rt shoulder flexion without increased pain    Time  8    Period  Weeks    Status  New    Target Date  03/18/19      PT LONG TERM GOAL #3   Title  pt will be able to perform MMT Rt shoulder flexion and abduction 5/5 without pain for return to functional activities at work    Time  8    Period  Weeks    Status  New    Target Date  03/18/19      PT LONG TERM GOAL #4   Title  FOTO </= to 25% limited    Baseline  35% limited at eval    Time  8    Period  Weeks    Status  New    Target Date  03/18/19      PT LONG TERM GOAL #5   Title  Pt will be able to lift 20  lb up to waist height for return to full function at work    Time  8    Period  Weeks    Status  New    Target Date  03/18/19            Plan - 02/05/19 1558    Clinical Impression Statement  Pt's pain was very irritated today.  She reports she has had more muscle spasms since last time she was here.  She is not sure if it is due to her being busier at work.  Pt was only able to tolerate very gentle massage and ended with TENS and heat.  Pt was able to demonstrate improved shoulder flexion when cued to improve her posture.  She tends to slouch through upper thoracic spine, rounds her shoulder and brings her head forward.  Pt will benefit from skilled PT to continue to assist with reducing muscle spasms for pain management and posture training.    PT Treatment/Interventions  ADLs/Self Care Home Management;Biofeedback;Electrical Stimulation;Iontophoresis 4mg /ml Dexamethasone;Moist Heat;Ultrasound;Therapeutic activities;Therapeutic exercise;Neuromuscular re-education;Manual techniques;Passive range of motion;Dry needling;Taping;Patient/family education    PT Next Visit Plan  soft tissue manipulation to anterior deltoid, upper trap, scalenes, fu on response to TENS, ionto #2    PT Home Exercise Plan  Access Code: [email or web address]    Consulted and Agree with Plan of Care  Patient       Patient will benefit from skilled therapeutic intervention in order to improve the following deficits and impairments:  Pain, Impaired UE functional use, Increased muscle spasms, Decreased range of motion, Decreased  strength  Visit Diagnosis: 1. Acute pain of right shoulder   2. Muscle weakness (generalized)   3. Stiffness of right shoulder, not elsewhere classified   4. Other muscle spasm        Problem List Patient Active Problem List   Diagnosis Date Noted  . S/P TKR (total knee replacement), bilateral   . Gastroesophageal reflux disease   . Post-operative pain   . Elevated blood pressure reading   .  Bilateral primary osteoarthritis of knee 07/03/2017  . Lateral epicondylitis of right elbow 04/02/2013    Junious SilkJakki L Desenglau, PT 02/05/2019, 4:12 PM  [facility] [address], [address] [place], [place], [address] Phone: [phone]   Fax:  [phone]  Name: Samantha MartRana S Carias MRN: [id number] Date of Birth: 31-May-1966

## 2019-02-06 ENCOUNTER — Ambulatory Visit: Payer: PRIVATE HEALTH INSURANCE | Admitting: Physical Therapy

## 2019-02-06 ENCOUNTER — Other Ambulatory Visit: Payer: Self-pay

## 2019-02-06 ENCOUNTER — Encounter: Payer: Self-pay | Admitting: Physical Therapy

## 2019-02-06 DIAGNOSIS — M25611 Stiffness of right shoulder, not elsewhere classified: Secondary | ICD-10-CM

## 2019-02-06 DIAGNOSIS — M25511 Pain in right shoulder: Secondary | ICD-10-CM

## 2019-02-06 DIAGNOSIS — M6281 Muscle weakness (generalized): Secondary | ICD-10-CM

## 2019-02-06 DIAGNOSIS — M62838 Other muscle spasm: Secondary | ICD-10-CM

## 2019-02-06 NOTE — Therapy (Signed)
[facility] [address], [address] [place], [place], [address] Phone: [phone]   Fax:  [phone]  Physical Therapy Treatment  Patient Details  Name: Samantha MartRana S Buis MRN: [id number] Date of Birth: [birth year] Referring Provider (PT): Marcene Corningalldorf, Peter, MD   Encounter Date: 02/06/2019  PT End of Session - 02/06/19 1653    Visit Number  6    Number of Visits  9    Date for PT Re-Evaluation  03/18/19    Authorization Type  W/C 9 visits approved    PT Start Time  1606    PT Stop Time  1646    PT Time Calculation (min)  40 min    Activity Tolerance  Patient limited by pain       Past Medical History:  Diagnosis Date  . Arthritis    "knees" (07/04/2017)  . Dysrhythmia    PALPITATIONS  . GERD (gastroesophageal reflux disease)   . High cholesterol   . Leg pain     Past Surgical History:  Procedure Laterality Date  . ENDOVENOUS ABLATION SAPHENOUS VEIN W/ LASER Left 2005   [facility]  . JOINT REPLACEMENT    . KNEE ARTHROSCOPY Right   . SHOULDER ARTHROSCOPY Right    "spurs"  . TOTAL KNEE ARTHROPLASTY Bilateral 07/03/2017   Procedure: TOTAL KNEE BILATERAL;  Surgeon: Marcene Corningalldorf, Peter, MD;  Location: [facility];  Service: Orthopedics;  Laterality: Bilateral;  . TUBAL LIGATION    . VAGINAL HYSTERECTOMY      There were no vitals filed for this visit.  Subjective Assessment - 02/06/19 1608    Subjective  PT helps for 2-3 days then pain comes back.   We are so busy at work.  Spasms right upper trap/levator trap.  The patch helped a lot.  Pulling towards me aggravates, lifting things at [employer].  Variable results with DN.  Likes soft tissue work and tape.    Currently in Pain?  Yes    Pain Score  7     Pain Location  Shoulder    Pain Orientation  Right    Aggravating Factors   pushing and pullling                       [facility] Adult PT Treatment/Exercise - 02/06/19 0001      Iontophoresis   Type of  Iontophoresis  Dexamethasone   #1   Location  subacromial region     Dose  1 ml   #2    Time  6 hr wear, skin intact      Manual Therapy   Manual Therapy  Muscle Energy Technique    Manual therapy comments  seated forearm pressure over levator 2 min     Soft tissue mobilization  Rt upper trap, scalenes, anterior deltoid    Myofascial Release  attempted suboccipital release but patient is too sensitive    Muscle Energy Technique  atttempted contract relax to upper traps but patient too sensitive to tolerate     Kinesiotex  Inhibit Muscle;Facilitate Muscle      Kinesiotix   Facilitate Muscle   posterior deltoid I strip and I strip across upper traps                PT Short Term Goals - 02/05/19 1602      PT SHORT TERM GOAL #1   Title  independent with initial HEP    Baseline  cannot do sometimes due to  pain    Status  On-going      PT SHORT TERM GOAL #2   Title  AROM Rt shoulder flexion 120    Status  On-going        PT Long Term Goals - 01/21/19 1505      PT LONG TERM GOAL #1   Title  Independent with advanced HEP    Time  8    Period  Weeks    Status  New    Target Date  03/18/19      PT LONG TERM GOAL #2   Title  increased AROM to 160 deg Rt shoulder flexion without increased pain    Time  8    Period  Weeks    Status  New    Target Date  03/18/19      PT LONG TERM GOAL #3   Title  pt will be able to perform MMT Rt shoulder flexion and abduction 5/5 without pain for return to functional activities at work    Time  8    Period  Weeks    Status  New    Target Date  03/18/19      PT LONG TERM GOAL #4   Title  FOTO </= to 25% limited    Baseline  35% limited at eval    Time  8    Period  Weeks    Status  New    Target Date  03/18/19      PT LONG TERM GOAL #5   Title  Pt will be able to lift 20 lb up to waist height for return to full function at work    Time  8    Period  Weeks    Status  New    Target Date  03/18/19            Plan  - 02/06/19 1654    Clinical Impression Statement  The patient continues to be in a heightened state of pain irritability and sensitivity.  She complains of right upper trap and levator spasm and taut bands and points are identified.  She declines dry needling but is receptive to manual therapy.  She is able to tolerate light to moderate pressure only.  She still has some skin irritation from previous ionto patch placement which can be attributed to patient sleeping overnight with it on rather than the recommended 4-6 hour removal.  Altered placement to subacromial region and applied KT to inhibit upper traps and active posterior scapular muscles.  Treatment modified in response to pain exacerbation.  Discussed with patient stress factors which may have increased pain response including her husband's recent illness and hospitalization.    Examination-Activity Limitations  Reach Overhead;Lift;Dressing;Caring for Others;Carry    PT Frequency  2x / week    PT Duration  8 weeks    PT Treatment/Interventions  ADLs/Self Care Home Management;Biofeedback;Electrical Stimulation;Iontophoresis 4mg /ml Dexamethasone;Moist Heat;Ultrasound;Therapeutic activities;Therapeutic exercise;Neuromuscular re-education;Manual techniques;Passive range of motion;Dry needling;Taping;Patient/family education    PT Next Visit Plan  soft tissue manipulation to anterior deltoid, upper trap, scalenes, ionto #2;  KT;  recheck ROM for goals;  patient will return end of next week (grandbaby is being born and she is going to [place] to help)    PT Home Exercise Plan  Access Code: [facility]       Patient will benefit from skilled therapeutic intervention in order to improve the following deficits and impairments:  Pain, Impaired UE functional use, Increased muscle spasms,  Decreased range of motion, Decreased strength  Visit Diagnosis: Acute pain of right shoulder  Muscle weakness (generalized)  Stiffness of right shoulder, not elsewhere  classified  Other muscle spasm     Problem List Patient Active Problem List   Diagnosis Date Noted  . S/P TKR (total knee replacement), bilateral   . Gastroesophageal reflux disease   . Post-operative pain   . Elevated blood pressure reading   . Bilateral primary osteoarthritis of knee 07/03/2017  . Lateral epicondylitis of right elbow 04/02/2013   Lavinia SharpsStacy Simpson, PT 02/06/19 5:03 PM Phone: [phone] Fax: [phone] Vivien PrestoSimpson, Stacy C 02/06/2019, 5:01 PM  [facility] [address], [address] [place], [place], [address] Phone: [phone]   Fax:  [phone]  Name: Samantha MartRana S Hunger MRN: [id number] Date of Birth: 01-04-1966

## 2019-02-14 ENCOUNTER — Ambulatory Visit: Payer: PRIVATE HEALTH INSURANCE | Admitting: Physical Therapy

## 2019-02-14 ENCOUNTER — Encounter: Payer: Self-pay | Admitting: Physical Therapy

## 2019-02-14 ENCOUNTER — Other Ambulatory Visit: Payer: Self-pay

## 2019-02-14 DIAGNOSIS — M25611 Stiffness of right shoulder, not elsewhere classified: Secondary | ICD-10-CM

## 2019-02-14 DIAGNOSIS — M62838 Other muscle spasm: Secondary | ICD-10-CM

## 2019-02-14 DIAGNOSIS — M25511 Pain in right shoulder: Secondary | ICD-10-CM | POA: Diagnosis not present

## 2019-02-14 DIAGNOSIS — M6281 Muscle weakness (generalized): Secondary | ICD-10-CM

## 2019-02-14 NOTE — Patient Instructions (Signed)
Access Code: [email or web address]  URL: [email or web address]  Date: 02/14/2019  Prepared by: Jari Favre   Exercises  Supine Shoulder Flexion Extension AAROM with Dowel - 15 reps - 1 sets - 1x daily - 7x weekly  Seated Shoulder Abduction AAROM with Dowel - 15 reps - 1 sets - 1x daily - 7x weekly  Seated Cervical Sidebending Stretch - 3 reps - 1 sets - 20 sec hold - 1x daily - 7x weekly  Seated Bilateral Shoulder External Rotation with Resistance - 10 reps - 3 sets - 1x daily - 7x weekly  Standing Cross Body Shoulder Stretch at Wall - 3 reps - 1 sets - 20 hold - 1x daily - 7x weekly  Gentle Levator Scapulae Stretch - 3 reps - 1 sets - 20 hold - 1x daily - 7x weekly  Standing Row with Anchored Resistance - 10 reps - 2 sets - 1x daily - 7x weekly  Standing Shoulder Extension with Resistance - 10 reps - 2 sets - 1x daily - 7x weekly  Sidelying Shoulder Abduction Palm Forward - 10 reps - 3 sets - 1x daily - 7x weekly

## 2019-02-14 NOTE — Therapy (Signed)
[facility] [address], [address] [place], [place], [address] Phone: [phone]   Fax:  [phone]  Physical Therapy Treatment  Patient Details  Name: Samantha Curtis MRN: [id number] Date of Birth: [birth year] Referring Provider (PT): Marcene Corningalldorf, Peter, MD   Encounter Date: 02/14/2019  PT End of Session - 02/14/19 1106    Visit Number  7    Number of Visits  9    Date for PT Re-Evaluation  03/18/19    Authorization Type  W/C 9 visits approved    Authorization - Number of Visits  9    PT Start Time  1102    PT Stop Time  1146    PT Time Calculation (min)  44 min       Past Medical History:  Diagnosis Date  . Arthritis    "knees" (07/04/2017)  . Dysrhythmia    PALPITATIONS  . GERD (gastroesophageal reflux disease)   . High cholesterol   . Leg pain     Past Surgical History:  Procedure Laterality Date  . ENDOVENOUS ABLATION SAPHENOUS VEIN W/ LASER Left 2005   [facility]  . JOINT REPLACEMENT    . KNEE ARTHROSCOPY Right   . SHOULDER ARTHROSCOPY Right    "spurs"  . TOTAL KNEE ARTHROPLASTY Bilateral 07/03/2017   Procedure: TOTAL KNEE BILATERAL;  Surgeon: Marcene Corningalldorf, Peter, MD;  Location: [facility];  Service: Orthopedics;  Laterality: Bilateral;  . TUBAL LIGATION    . VAGINAL HYSTERECTOMY      There were no vitals filed for this visit.  Subjective Assessment - 02/14/19 1108    Subjective  Pt states she has been off all week because her daughter had her baby.  She is feeling good today    Patient Stated Goals  be able to do what I need to do without pain and make sure there is no tear    Currently in Pain?  No/denies                       [facility] - 02/14/19 0001      Shoulder Exercises: Sidelying   External Rotation  Strengthening;Right;10 reps    Flexion  Right;Strengthening;5 reps    ABduction  Strengthening;Right;20 reps   no pain - added to HEP     Shoulder  Exercises: Standing   External Rotation  Strengthening;Both;10 reps;Theraband    Theraband Level (Shoulder External Rotation)  Level 1 (Yellow)    Diagonals Limitations  Rt arm stabilizing - 10x yellow band; attempt with Rt arm doing movement and stopped due to increased pain - no band      Modalities   Modalities  Ultrasound      Ultrasound   Ultrasound Location  anterior/superior humeral head    Ultrasound Parameters  1.2 at 20% duty; 3.3MHz    Ultrasound Goals  Pain      Iontophoresis   Type of Iontophoresis  Dexamethasone   #3   Location  subacromial region     Dose  1 ml   #3   Time  6 hr wear, skin intact      Manual Therapy   Soft tissue mobilization  Rt upper trap, levator, scalenes, anterior deltoid             PT Education - 02/14/19 1154    Education Details  Access Code: [email or web address]    Person(s) Educated  Patient    Methods  Explanation;Demonstration;Handout;Verbal cues  Comprehension  Verbalized understanding;Returned demonstration       PT Short Term Goals - 02/14/19 1159      PT SHORT TERM GOAL #1   Title  independent with initial HEP    Status  Achieved      PT SHORT TERM GOAL #2   Title  AROM Rt shoulder flexion 120    Baseline  128    Status  Achieved        PT Long Term Goals - 02/14/19 1200      PT LONG TERM GOAL #1   Title  Independent with advanced HEP    Status  On-going      PT LONG TERM GOAL #2   Title  increased AROM to 160 deg Rt shoulder flexion without increased pain    Baseline  128    Status  On-going            Plan - 02/14/19 1154    Clinical Impression Statement  Patient is still having pain with certain movements but much less muscle tension due to no working for one week.  She was able to tolerate gentle strengthening exercises during session today with some mild increased pain.  Pt responded well to ultrasound after therex and pain was back down to zero.  Pt seems to have inflammation or tendinitis of the  RTC.  She continues to have muscle tension and tender to palpation of upper trap, levator, and scalenes.  Pt will benefit from skilled PT to work on slow strength porgression and patient may need MRI to rule out other soft tissue injury to shoulder.    PT Treatment/Interventions  ADLs/Self Care Home Management;Biofeedback;Electrical Stimulation;Iontophoresis 4mg /ml Dexamethasone;Moist Heat;Ultrasound;Therapeutic activities;Therapeutic exercise;Neuromuscular re-education;Manual techniques;Passive range of motion;Dry needling;Taping;Patient/family education    PT Next Visit Plan  soft tissue manipulation to anterior deltoid, upper trap, scalenes, ionto #3;  KT;  recheck ROM for goals; gentle RTC strength as tolerated    PT Home Exercise Plan  Access Code: [email or web address]    Consulted and Agree with Plan of Care  Patient       Patient will benefit from skilled therapeutic intervention in order to improve the following deficits and impairments:  Pain, Impaired UE functional use, Increased muscle spasms, Decreased range of motion, Decreased strength  Visit Diagnosis: Acute pain of right shoulder  Muscle weakness (generalized)  Stiffness of right shoulder, not elsewhere classified  Other muscle spasm     Problem List Patient Active Problem List   Diagnosis Date Noted  . S/P TKR (total knee replacement), bilateral   . Gastroesophageal reflux disease   . Post-operative pain   . Elevated blood pressure reading   . Bilateral primary osteoarthritis of knee 07/03/2017  . Lateral epicondylitis of right elbow 04/02/2013    Jule Ser, PT 02/14/2019, 12:08 PM  [facility] [address], [place], [place], [address] Phone: [phone]   Fax:  [phone]  Name: Samantha Curtis MRN: [id number] Date of Birth: 1965/08/19

## 2019-02-17 ENCOUNTER — Encounter: Payer: Self-pay | Admitting: Physical Therapy

## 2019-02-17 ENCOUNTER — Other Ambulatory Visit: Payer: Self-pay

## 2019-02-17 ENCOUNTER — Ambulatory Visit: Payer: PRIVATE HEALTH INSURANCE | Admitting: Physical Therapy

## 2019-02-17 DIAGNOSIS — M25511 Pain in right shoulder: Secondary | ICD-10-CM

## 2019-02-17 DIAGNOSIS — M62838 Other muscle spasm: Secondary | ICD-10-CM

## 2019-02-17 DIAGNOSIS — M25611 Stiffness of right shoulder, not elsewhere classified: Secondary | ICD-10-CM

## 2019-02-17 DIAGNOSIS — M6281 Muscle weakness (generalized): Secondary | ICD-10-CM

## 2019-02-17 NOTE — Therapy (Signed)
[facility] [address], [place], [place], [address] Phone: [phone]   Fax:  [phone]  Physical Therapy Treatment  Patient Details  Name: Samantha Curtis MRN: [id number] Date of Birth: [birth year] Referring Provider (PT): Melrose Nakayama, MD   Encounter Date: 02/17/2019  PT End of Session - 02/17/19 1657    Visit Number  8    Number of Visits  9    Date for PT Re-Evaluation  03/18/19    Authorization Type  W/C 9 visits approved    PT Start Time  1413    PT Stop Time  1452    PT Time Calculation (min)  39 min    Activity Tolerance  Patient limited by pain       Past Medical History:  Diagnosis Date  . Arthritis    "knees" (07/04/2017)  . Dysrhythmia    PALPITATIONS  . GERD (gastroesophageal reflux disease)   . High cholesterol   . Leg pain     Past Surgical History:  Procedure Laterality Date  . ENDOVENOUS ABLATION SAPHENOUS VEIN W/ LASER Left 2005   [facility]  . JOINT REPLACEMENT    . KNEE ARTHROSCOPY Right   . SHOULDER ARTHROSCOPY Right    "spurs"  . TOTAL KNEE ARTHROPLASTY Bilateral 07/03/2017   Procedure: TOTAL KNEE BILATERAL;  Surgeon: Melrose Nakayama, MD;  Location: [facility];  Service: Orthopedics;  Laterality: Bilateral;  . TUBAL LIGATION    . VAGINAL HYSTERECTOMY      There were no vitals filed for this visit.  Subjective Assessment - 02/17/19 1708    Subjective  States she is really sore today.  I think it was from making this dish yesterday and had to cook a lot of food today because my freezer broke.    Patient Stated Goals  be able to do what I need to do without pain and make sure there is no tear    Currently in Pain?  Yes    Pain Score  6     Pain Location  Shoulder    Pain Orientation  Right    Pain Descriptors / Indicators  Aching    Pain Type  Acute pain    Pain Onset  More than a month ago    Pain Frequency  Intermittent    Multiple Pain Sites  No                        [facility] Adult PT Treatment/Exercise - 02/17/19 0001      Shoulder Exercises: Supine   Other Supine Exercises  circles on ceiling 15x each way      Shoulder Exercises: Sidelying   External Rotation  Strengthening;Right;10 reps   +pain    ABduction  Strengthening;Right;10 reps   +pain     Shoulder Exercises: Isometric Strengthening   Extension  5X5"    External Rotation  5X5"    Internal Rotation  5X5"    ABduction  5X5"      Ultrasound   Ultrasound Location  humeral head    Ultrasound Parameters  1.2 at 20% duty; 3.3 MHz    Ultrasound Goals  Pain      Iontophoresis   Type of Iontophoresis  Dexamethasone   #4   Location  subacromial region     Dose  1 ml    Time  6 hr wear, skin intact      Manual Therapy   Soft  tissue mobilization  Rt upper trap, levator, scalenes, anterior deltoid               PT Short Term Goals - 02/14/19 1159      PT SHORT TERM GOAL #1   Title  independent with initial HEP    Status  Achieved      PT SHORT TERM GOAL #2   Title  AROM Rt shoulder flexion 120    Baseline  128    Status  Achieved        PT Long Term Goals - 02/14/19 1200      PT LONG TERM GOAL #1   Title  Independent with advanced HEP    Status  On-going      PT LONG TERM GOAL #2   Title  increased AROM to 160 deg Rt shoulder flexion without increased pain    Baseline  128    Status  On-going            Plan - 02/17/19 1659    Clinical Impression Statement  Pt continues to have a very high level of irritability.  She came in to clinic today with increased pain reporting she had to do a lot of cooking yesterday and today as well as mowing the lawn yesterday.  She tolerated very limited exercises today needing cues to stop from guarding her shoulders using upper trap activation.  Pt is going to schedule her next appointment after she sees the doctor in two days.  Pt did have good response from US and has been getting relief  from ionto patch.  She will continue to benefit from skilled PT to progress RTC strength as tolerated.    PT Treatment/Interventions  ADLs/Self Care Home Management;Biofeedback;Electrical Stimulation;Iontophoresis 4mg /ml Dexamethasone;Moist Heat;Ultrasound;Therapeutic activities;Therapeutic exercise;Neuromuscular re-education;Manual techniques;Passive range of motion;Dry needling;Taping;Patient/family education    PT Next Visit Plan  re-eval; soft tissue manipulation to anterior deltoid, upper trap, scalenes, ionto #3;  KT;  review goals; gentle RTC strength as tolerated    PT Home Exercise Plan  Access Code: [email or web address]    Consulted and Agree with Plan of Care  Patient       Patient will benefit from skilled therapeutic intervention in order to improve the following deficits and impairments:  Pain, Impaired UE functional use, Increased muscle spasms, Decreased range of motion, Decreased strength  Visit Diagnosis: Acute pain of right shoulder  Muscle weakness (generalized)  Stiffness of right shoulder, not elsewhere classified  Other muscle spasm     Problem List Patient Active Problem List   Diagnosis Date Noted  . S/P TKR (total knee replacement), bilateral   . Gastroesophageal reflux disease   . Post-operative pain   . Elevated blood pressure reading   . Bilateral primary osteoarthritis of knee 07/03/2017  . Lateral epicondylitis of right elbow 04/02/2013    Junious SilkJakki L Karlei Waldo, PT 02/17/2019, 5:17 PM  [facility] [address], [address] [place], [place], [address] Phone: [phone]   Fax:  [phone]  Name: Samantha Curtis MRN: [id number] Date of Birth: [birth year]

## 2019-02-20 ENCOUNTER — Encounter: Payer: Self-pay | Admitting: Physical Therapy

## 2019-02-20 ENCOUNTER — Ambulatory Visit: Payer: PRIVATE HEALTH INSURANCE | Attending: Orthopaedic Surgery | Admitting: Physical Therapy

## 2019-02-20 ENCOUNTER — Other Ambulatory Visit (HOSPITAL_COMMUNITY): Payer: Self-pay | Admitting: Orthopaedic Surgery

## 2019-02-20 ENCOUNTER — Other Ambulatory Visit: Payer: Self-pay

## 2019-02-20 ENCOUNTER — Other Ambulatory Visit: Payer: Self-pay | Admitting: Orthopaedic Surgery

## 2019-02-20 DIAGNOSIS — M25611 Stiffness of right shoulder, not elsewhere classified: Secondary | ICD-10-CM | POA: Insufficient documentation

## 2019-02-20 DIAGNOSIS — M6281 Muscle weakness (generalized): Secondary | ICD-10-CM | POA: Insufficient documentation

## 2019-02-20 DIAGNOSIS — M25511 Pain in right shoulder: Secondary | ICD-10-CM

## 2019-02-20 DIAGNOSIS — M62838 Other muscle spasm: Secondary | ICD-10-CM | POA: Diagnosis present

## 2019-02-20 NOTE — Therapy (Signed)
[facility] [address] [place], [place], [address] Phone: [phone]   Fax:  [phone]  Physical Therapy Treatment  Patient Details  Name: Samantha Curtis MRN: [id number] Date of Birth: 1965-06-25 Referring Provider (PT): Marcene Corning, MD   Encounter Date: 02/20/2019  PT End of Session - 02/20/19 1647    Visit Number  9    Number of Visits  17    Date for PT Re-Evaluation  03/18/19    Authorization Type  W/C 17 visits approved    PT Start Time  1557    PT Stop Time  1640    PT Time Calculation (min)  43 min    Activity Tolerance  Patient tolerated treatment well       Past Medical History:  Diagnosis Date  . Arthritis    "knees" (07/04/2017)  . Dysrhythmia    PALPITATIONS  . GERD (gastroesophageal reflux disease)   . High cholesterol   . Leg pain     Past Surgical History:  Procedure Laterality Date  . ENDOVENOUS ABLATION SAPHENOUS VEIN W/ LASER Left 2005   [facility]  . JOINT REPLACEMENT    . KNEE ARTHROSCOPY Right   . SHOULDER ARTHROSCOPY Right    "spurs"  . TOTAL KNEE ARTHROPLASTY Bilateral 07/03/2017   Procedure: TOTAL KNEE BILATERAL;  Surgeon: Marcene Corning, MD;  Location: [facility] OR;  Service: Orthopedics;  Laterality: Bilateral;  . TUBAL LIGATION    . VAGINAL HYSTERECTOMY      There were no vitals filed for this visit.  Subjective Assessment - 02/20/19 1553    Subjective  Feels good after PT but then the pain comes back with activity.  No spasms since out of work 2 weeks.  Returning to work on Tuesday.  Having MRI the following Saturday.  Currently anterior/lateral pain.   Ionto and U/S helps.    Currently in Pain?  Yes    Pain Score  5     Pain Location  Shoulder    Pain Orientation  Right    Pain Type  Acute pain                       [facility] Adult PT Treatment/Exercise - 02/20/19 0001      Shoulder Exercises: Seated   Other Seated Exercises  review of current  HEP, discussed holding repetitious external rotation on days when anterior shoulder pain is increased       Shoulder Exercises: Prone   Extension  Strengthening;Right;15 reps    Horizontal ABduction 1  Strengthening;Right;15 reps      Shoulder Exercises: Standing   Extension  Strengthening;Right;Left;20 reps;Theraband    Theraband Level (Shoulder Extension)  Level 2 (Red)    Row  Strengthening;Right;Left;20 reps;Theraband    Theraband Level (Shoulder Row)  Level 2 (Red)      Moist Heat Therapy   Number Minutes Moist Heat  5 Minutes   sensitive to heat    Moist Heat Location  Shoulder      Iontophoresis   Type of Iontophoresis  Dexamethasone   #4   Location  subacromial region     Dose  1 ml   #4   Time  6 hr wear, skin intact      Manual Therapy   Soft tissue mobilization  right upper trap, levator, infraspinatus     Kinesiotex  Inhibit Muscle   I strip perpendicular to upper trap  Trigger Point Dry Needling - 02/20/19 0001    Consent Given?  Yes    Other Dry Needling  right     Upper Trapezius Response  Twitch reponse elicited;Palpable increased muscle length    Levator Scapulae Response  Twitch response elicited;Palpable increased muscle length    Infraspinatus Response  Palpable increased muscle length             PT Short Term Goals - 02/14/19 1159      PT SHORT TERM GOAL #1   Title  independent with initial HEP    Status  Achieved      PT SHORT TERM GOAL #2   Title  AROM Rt shoulder flexion 120    Baseline  128    Status  Achieved        PT Long Term Goals - 02/14/19 1200      PT LONG TERM GOAL #1   Title  Independent with advanced HEP    Status  On-going      PT LONG TERM GOAL #2   Title  increased AROM to 160 deg Rt shoulder flexion without increased pain    Baseline  128    Status  On-going            Plan - 02/20/19 1648    Clinical Impression Statement  The patient reports fewer muscle spasms during the last 2 weeks while  not working but has continued anterior lateral shoulder pain with her exercises.  Reviewed HEP and focused on 4 exercises in which she was able to do without pain:  standing shoulder extension, standing rows, prone extension and prone horizontal abduction.  Verbal and tactile cues to activate middle and lower traps.  She is quite sensitive to DN with multiple tender points in upper traps and levator but reports she had a good response to it last visit so she would like to try it again.  Good response to KT inhibitory style over upper traps.  Multiple cues for postural correction when sitting.  Sitting slouched is a trigger for production of upper trap and levator pain.    PT Frequency  2x / week    PT Duration  8 weeks    PT Treatment/Interventions  ADLs/Self Care Home Management;Biofeedback;Electrical Stimulation;Iontophoresis 4mg /ml Dexamethasone;Moist Heat;Ultrasound;Therapeutic activities;Therapeutic exercise;Neuromuscular re-education;Manual techniques;Passive range of motion;Dry needling;Taping;Patient/family education    PT Next Visit Plan  assess response to DN #3;  continue ionto trial;  prone I, Ts;  scapular strengthening;  KT;  MRI scheduled for 9/12    PT Home Exercise Plan  Access Code: [email or web address]       Patient will benefit from skilled therapeutic intervention in order to improve the following deficits and impairments:  Pain, Impaired UE functional use, Increased muscle spasms, Decreased range of motion, Decreased strength  Visit Diagnosis: Acute pain of right shoulder  Muscle weakness (generalized)  Stiffness of right shoulder, not elsewhere classified  Other muscle spasm     Problem List Patient Active Problem List   Diagnosis Date Noted  . S/P TKR (total knee replacement), bilateral   . Gastroesophageal reflux disease   . Post-operative pain   . Elevated blood pressure reading   . Bilateral primary osteoarthritis of knee 07/03/2017  . Lateral epicondylitis of right  elbow 04/02/2013   Lavinia SharpsStacy Lawonda Pretlow, PT 02/20/19 4:57 PM Phone: [phone] Fax: [phone] Vivien PrestoSimpson, Henri Baumler C 02/20/2019, 4:57 PM  [facility] [address], [address] [place], [place], [address] Phone: [phone]  Fax:  [phone]  Name: Samantha Curtis MRN: [id number] Date of Birth: [birth year]

## 2019-02-26 MED FILL — METOPROLOL TARTRATE 25 MG T: 25 | 90 days supply | Qty: 90 | Fill #0

## 2019-02-27 MED FILL — rOPINIRole HCL 0.5 MG TABS: 0.5 | 30 days supply | Qty: 30 | Fill #0

## 2019-03-01 ENCOUNTER — Ambulatory Visit (HOSPITAL_COMMUNITY)
Admission: RE | Admit: 2019-03-01 | Discharge: 2019-03-01 | Disposition: A | Discharge status: Home / Self Care | Payer: PRIVATE HEALTH INSURANCE | Source: Ambulatory Visit | Attending: Orthopaedic Surgery | Admitting: Orthopaedic Surgery

## 2019-03-01 DIAGNOSIS — M25511 Pain in right shoulder: Secondary | ICD-10-CM | POA: Insufficient documentation

## 2019-03-05 ENCOUNTER — Ambulatory Visit: Payer: PRIVATE HEALTH INSURANCE | Admitting: Physical Therapy

## 2019-03-05 ENCOUNTER — Other Ambulatory Visit: Payer: Self-pay

## 2019-03-05 ENCOUNTER — Encounter: Payer: Self-pay | Admitting: Physical Therapy

## 2019-03-05 DIAGNOSIS — M25611 Stiffness of right shoulder, not elsewhere classified: Secondary | ICD-10-CM

## 2019-03-05 DIAGNOSIS — M62838 Other muscle spasm: Secondary | ICD-10-CM

## 2019-03-05 DIAGNOSIS — M6281 Muscle weakness (generalized): Secondary | ICD-10-CM

## 2019-03-05 DIAGNOSIS — M25511 Pain in right shoulder: Secondary | ICD-10-CM

## 2019-03-05 NOTE — Therapy (Signed)
[facility] [address], [address] [place], [place], [address] Phone: [phone]   Fax:  [phone]  Physical Therapy Treatment  Patient Details  Name: Samantha Curtis MRN: [id number] Date of Birth: [birth year] Referring Provider (PT): Marcene Corningalldorf, Peter, MD   Encounter Date: 03/05/2019  PT End of Session - 03/05/19 1609    Visit Number  10    Number of Visits  17    Date for PT Re-Evaluation  03/18/19    Authorization Type  W/C 17 visits approved    PT Start Time  1605    PT Stop Time  1645    PT Time Calculation (min)  40 min    Activity Tolerance  Patient tolerated treatment well    Behavior During Therapy  [facility] for tasks assessed/performed       Past Medical History:  Diagnosis Date  . Arthritis    "knees" (07/04/2017)  . Dysrhythmia    PALPITATIONS  . GERD (gastroesophageal reflux disease)   . High cholesterol   . Leg pain     Past Surgical History:  Procedure Laterality Date  . ENDOVENOUS ABLATION SAPHENOUS VEIN W/ LASER Left 2005   [facility]  . JOINT REPLACEMENT    . KNEE ARTHROSCOPY Right   . SHOULDER ARTHROSCOPY Right    "spurs"  . TOTAL KNEE ARTHROPLASTY Bilateral 07/03/2017   Procedure: TOTAL KNEE BILATERAL;  Surgeon: Marcene Corningalldorf, Peter, MD;  Location: [facility];  Service: Orthopedics;  Laterality: Bilateral;  . TUBAL LIGATION    . VAGINAL HYSTERECTOMY      There were no vitals filed for this visit.  Subjective Assessment - 03/05/19 1656    Subjective  Pt reports more pain today.  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    Patient Stated Goals  be able to do what I need to do without pain and make sure there is no tear    Currently in Pain?  Yes    Pain Score  8     Pain Location  Shoulder    Pain Orientation  Right    Pain Descriptors / Indicators  Tightness;Throbbing    Pain Type  Acute pain    Pain Onset  More than a month ago    Pain Frequency  Intermittent    Multiple Pain Sites  No          [facility] PT Assessment - 03/05/19 0001      AROM   AROM Assessment Site  Shoulder    Right/Left Shoulder  Right;Left    Right Shoulder Flexion  124 Degrees   prior to treatment; after Tx improved ROM with less pain                  [facility] Adult PT Treatment/Exercise - 03/05/19 0001      Shoulder Exercises: Isometric Strengthening   Extension  5X5"    External Rotation  5X5"      Iontophoresis   Type of Iontophoresis  Dexamethasone    Location  subacromial region     Dose  1 ml   #5   Time  6 hr wear, skin intact      Manual Therapy   Joint Mobilization  gentle GH distraction grade I-II     Soft tissue mobilization  Rt upper trap, infraspinatus, teres minor, pecs, deltoid    Kinesiotex  Inhibit Muscle   I strip perpendicular to upper trap       Trigger Point  Dry Needling - 03/05/19 0001    Consent Given?  Yes    Muscles Treated Upper Quadrant  Teres minor    Other Dry Needling  right     Upper Trapezius Response  Twitch reponse elicited;Palpable increased muscle length    Infraspinatus Response  Twitch response elicited;Palpable increased muscle length    Teres minor Response  Twitch response elicited;Palpable increased muscle length             PT Short Term Goals - 02/14/19 1159      PT SHORT TERM GOAL #1   Title  independent with initial HEP    Status  Achieved      PT SHORT TERM GOAL #2   Title  AROM Rt shoulder flexion 120    Baseline  128    Status  Achieved        PT Long Term Goals - 03/05/19 1611      PT LONG TERM GOAL #1   Title  Independent with advanced HEP    Status  On-going      PT LONG TERM GOAL #2   Title  increased AROM to 160 deg Rt shoulder flexion without increased pain    Baseline  124    Status  On-going      PT LONG TERM GOAL #3   Title  pt will be able to perform MMT Rt shoulder flexion and abduction 5/5 without pain for return to functional activities at work    Baseline  currently pain with AROM to  90 deg    Status  On-going      PT LONG TERM GOAL #4   Title  FOTO </= to 25% limited    Status  On-going      PT LONG TERM GOAL #5   Title  Pt will be able to lift 20 lb up to waist height for return to full function at work    Status  On-going            Plan - 03/05/19 1658    Clinical Impression Statement  Patient presents to clinic today after returning to work from break.  She was in more pain and has palpable increased muscle spasms throughout upper trap, low trap, infraspinatus and teres minor muscles.  Pt responded well to STM and dry needling techniqeus.  She will benefit from skilled PT to continue to work on strength while managing pain and muscle spasms.    PT Treatment/Interventions  ADLs/Self Care Home Management;Biofeedback;Electrical Stimulation;Iontophoresis 4mg /ml Dexamethasone;Moist Heat;Ultrasound;Therapeutic activities;Therapeutic exercise;Neuromuscular re-education;Manual techniques;Passive range of motion;Dry needling;Taping;Patient/family education    PT Next Visit Plan  assess response to DN #4;  continue ionto trial;  prone I, Ts;  scapular strengthening;  KT    PT Home Exercise Plan  Access Code: [email or web address]    Consulted and Agree with Plan of Care  Patient       Patient will benefit from skilled therapeutic intervention in order to improve the following deficits and impairments:  Pain, Impaired UE functional use, Increased muscle spasms, Decreased range of motion, Decreased strength  Visit Diagnosis: Acute pain of right shoulder  Muscle weakness (generalized)  Stiffness of right shoulder, not elsewhere classified  Other muscle spasm     Problem List Patient Active Problem List   Diagnosis Date Noted  . S/P TKR (total knee replacement), bilateral   . Gastroesophageal reflux disease   . Post-operative pain   . Elevated blood pressure reading   . Bilateral primary  osteoarthritis of knee 07/03/2017  . Lateral epicondylitis of right elbow  04/02/2013    Jule Ser, PT 03/05/2019, 5:02 PM  [facility] [address], [place], [place], [address] Phone: [phone]   Fax:  [phone]  Name: Samantha Curtis MRN: [id number] Date of Birth: 09-20-1965

## 2019-03-06 ENCOUNTER — Ambulatory Visit: Payer: PRIVATE HEALTH INSURANCE | Admitting: Physical Therapy

## 2019-03-06 ENCOUNTER — Other Ambulatory Visit: Payer: Self-pay

## 2019-03-06 ENCOUNTER — Encounter: Payer: Self-pay | Admitting: Physical Therapy

## 2019-03-06 DIAGNOSIS — M25511 Pain in right shoulder: Secondary | ICD-10-CM | POA: Diagnosis not present

## 2019-03-06 DIAGNOSIS — M25611 Stiffness of right shoulder, not elsewhere classified: Secondary | ICD-10-CM

## 2019-03-06 DIAGNOSIS — M62838 Other muscle spasm: Secondary | ICD-10-CM

## 2019-03-06 DIAGNOSIS — M6281 Muscle weakness (generalized): Secondary | ICD-10-CM

## 2019-03-06 NOTE — Patient Instructions (Signed)
TENS UNIT  This is helpful for muscle pain and spasm.   Search and Purchase a TENS 7000 2nd edition at www.tenspros.com or www.amazon.com  (It should be less than $30)     TENS unit instructions:  Do not shower or bathe with the unit on Turn the unit off before removing electrodes or batteries If the electrodes lose stickiness add a drop of water to the electrodes after they are disconnected from the unit and place on plastic sheet. If you continued to have difficulty, call the TENS unit company to purchase more electrodes. Do not apply lotion on the skin area prior to use. Make sure the skin is clean and dry as this will help prolong the life of the electrodes. After use, always check skin for unusual red areas, rash or other skin difficulties. If there are any skin problems, does not apply electrodes to the same area. Never remove the electrodes from the unit by pulling the wires. Do not use the TENS unit or electrodes other than as directed. Do not change electrode placement without consulting your therapist or physician. Keep 2 fingers with between each electrode.   TENS stands for Transcutaneous Electrical Nerve Stimulation. In other words, electrical impulses are allowed to pass through the skin in order to excite a nerve.   Purpose and Use of TENS:  TENS is a method used to manage acute and chronic pain without the use of drugs. It has been effective in managing pain associated with surgery, sprains, strains, trauma, rheumatoid arthritis, and neuralgias. It is a non-addictive, low risk, and non-invasive technique used to control pain. It is not, by any means, a curative form of treatment.   How TENS Works:  Most TENS units are a small pocket-sized unit powered by one 9 volt battery. Attached to the outside of the unit are two lead wires where two pins and/or snaps connect on each wire. All units come with a set of four reusable pads or electrodes. These are placed on the skin  surrounding the area involved. By inserting the leads into  the pads, the electricity can pass from the unit making the circuit complete.  As the intensity is turned up slowly, the electrical current enters the body from the electrodes through the skin to the surrounding nerve fibers. This triggers the release of hormones from within the body. These hormones contain pain relievers. By increasing the circulation of these hormones, the person's pain may be lessened. It is also believed that the electrical stimulation itself helps to block the pain messages being sent to the brain, thus also decreasing the body's perception of pain.   Hazards:  TENS units are NOT to be used by patients with PACEMAKERS, DEFIBRILLATORS, DIABETIC PUMPS, PREGNANT WOMEN, and patients with SEIZURE DISORDERS.  TENS units are NOT to be used over the heart, throat, brain, or spinal cord.  One of the major side effects from the TENS unit may be skin irritation. Some people may develop a rash if they are sensitive to the materials used in the electrodes or the connecting wires.     Avoid overuse due the body getting used to the stem making it not as effective over time.    

## 2019-03-06 NOTE — Therapy (Signed)
[facility] [address] [place], [place], [address] Phone: [phone]   Fax:  [phone]  Physical Therapy Treatment  Patient Details  Name: Samantha Curtis MRN: [id number] Date of Birth: May 25, 1966 Referring Provider (PT): Marcene Corning, MD   Encounter Date: 03/06/2019  PT End of Session - 03/06/19 1726    Visit Number  11    Number of Visits  17    Date for PT Re-Evaluation  03/18/19    Authorization Type  W/C 17 visits approved    PT Start Time  1432    PT Stop Time  1514    PT Time Calculation (min)  42 min    Activity Tolerance  Patient tolerated treatment well       Past Medical History:  Diagnosis Date  . Arthritis    "knees" (07/04/2017)  . Dysrhythmia    PALPITATIONS  . GERD (gastroesophageal reflux disease)   . High cholesterol   . Leg pain     Past Surgical History:  Procedure Laterality Date  . ENDOVENOUS ABLATION SAPHENOUS VEIN W/ LASER Left 2005   [facility]  . JOINT REPLACEMENT    . KNEE ARTHROSCOPY Right   . SHOULDER ARTHROSCOPY Right    "spurs"  . TOTAL KNEE ARTHROPLASTY Bilateral 07/03/2017   Procedure: TOTAL KNEE BILATERAL;  Surgeon: Marcene Corning, MD;  Location: [facility];  Service: Orthopedics;  Laterality: Bilateral;  . TUBAL LIGATION    . VAGINAL HYSTERECTOMY      There were no vitals filed for this visit.  Subjective Assessment - 03/06/19 1433    Subjective  Had MRI no results yet.  When off work, I  feel better.  Worked last week and this week.  The exercises make me hurt on the days I work.  Both of my legs hurt.    Currently in Pain?  Yes    Pain Score  6     Pain Location  Shoulder    Pain Orientation  Right                       [facility] Adult PT Treatment/Exercise - 03/06/19 0001      Self-Care   Self-Care  Posture    Posture  discussed meditation and deep breathing and evidence on pain modulation     Other Self-Care Comments   discussed  home TENs for pain control       Shoulder Exercises: Prone   Other Prone Exercises  verbal review and discussed doing strengthening ex's on non-working days      Shoulder Exercises: Stretch   Other Shoulder Stretches  inferior capsule stretch using towell roll in axilla with emphasis on pulling down toward the floor    Other Shoulder Stretches  holding 10# weight for joint distraction (picked this weight since patient has 10# dumbell at home) static hold 20 sec 3x       Cryotherapy   Number Minutes Cryotherapy  3 Minutes   discontinued secondary to complaints of pain    Cryotherapy Location  Shoulder    Type of Cryotherapy  Ice pack      Electrical Stimulation   Electrical Stimulation Location  right shoulder     Electrical Stimulation Action  IFC    Electrical Stimulation Parameters  8 ma 12 min seated    Electrical Stimulation Goals  Pain      Iontophoresis   Type of Iontophoresis  Dexamethasone  Location  subacromial region     Dose  1 ml   #6   Time  6 hr wear, skin intact      Manual Therapy   Soft tissue mobilization  Addaday instrument assisted to upper trap, levator, lateral deltoid and triceps seated              PT Education - 03/06/19 1458    Education Details  Home TENS info    Person(s) Educated  Patient    Methods  Explanation;Demonstration;Handout    Comprehension  Verbalized understanding       PT Short Term Goals - 02/14/19 1159      PT SHORT TERM GOAL #1   Title  independent with initial HEP    Status  Achieved      PT SHORT TERM GOAL #2   Title  AROM Rt shoulder flexion 120    Baseline  128    Status  Achieved        PT Long Term Goals - 03/05/19 1611      PT LONG TERM GOAL #1   Title  Independent with advanced HEP    Status  On-going      PT LONG TERM GOAL #2   Title  increased AROM to 160 deg Rt shoulder flexion without increased pain    Baseline  124    Status  On-going      PT LONG TERM GOAL #3   Title  pt will be able  to perform MMT Rt shoulder flexion and abduction 5/5 without pain for return to functional activities at work    Baseline  currently pain with AROM to 90 deg    Status  On-going      PT LONG TERM GOAL #4   Title  FOTO </= to 25% limited    Status  On-going      PT LONG TERM GOAL #5   Title  Pt will be able to lift 20 lb up to waist height for return to full function at work    Status  On-going            Plan - 03/06/19 1726    Clinical Impression Statement  The patient continues to have high symptom irritability and central sensitization.  We discussed self care strategies including meditation and deep breathing and she plans to get a home TENS unit for pain control and to help with muscle spasms.  Since she reports her home exercises "make her hurt more"  we focused on gentle stretch-style capsular stretches to be done on work days and to save the strengthening exercises for non work days.  Iontophoresis series is complete.    PT Frequency  2x / week    PT Duration  8 weeks    PT Treatment/Interventions  ADLs/Self Care Home Management;Biofeedback;Electrical Stimulation;Iontophoresis 4mg /ml Dexamethasone;Moist Heat;Ultrasound;Therapeutic activities;Therapeutic exercise;Neuromuscular re-education;Manual techniques;Passive range of motion;Dry needling;Taping;Patient/family education    PT Next Visit Plan  DN as needed;  capsule stretches; pain education;   prone I, Ts;  scapular strengthening;  KT    PT Home Exercise Plan  Access Code: [facility]       Patient will benefit from skilled therapeutic intervention in order to improve the following deficits and impairments:  Pain, Impaired UE functional use, Increased muscle spasms, Decreased range of motion, Decreased strength  Visit Diagnosis: Acute pain of right shoulder  Muscle weakness (generalized)  Stiffness of right shoulder, not elsewhere classified  Other muscle spasm  Problem List Patient Active Problem List    Diagnosis Date Noted  . S/P TKR (total knee replacement), bilateral   . Gastroesophageal reflux disease   . Post-operative pain   . Elevated blood pressure reading   . Bilateral primary osteoarthritis of knee 07/03/2017  . Lateral epicondylitis of right elbow 04/02/2013   Lavinia SharpsStacy Brandonn Capelli, PT 03/06/19 5:38 PM Phone: [phone] Fax: [phone] Vivien PrestoSimpson, Tanesia Butner C 03/06/2019, 5:37 PM  [facility] [address], [address] [place], [place], [address] Phone: [phone]   Fax:  [phone]  Name: Samantha Curtis MRN: [id number] Date of Birth: 1966/01/01

## 2019-03-10 ENCOUNTER — Ambulatory Visit: Payer: PRIVATE HEALTH INSURANCE | Admitting: Physical Therapy

## 2019-03-10 ENCOUNTER — Encounter: Payer: Self-pay | Admitting: Physical Therapy

## 2019-03-10 ENCOUNTER — Other Ambulatory Visit: Payer: Self-pay

## 2019-03-10 DIAGNOSIS — M25511 Pain in right shoulder: Secondary | ICD-10-CM

## 2019-03-10 DIAGNOSIS — M62838 Other muscle spasm: Secondary | ICD-10-CM

## 2019-03-10 DIAGNOSIS — M25611 Stiffness of right shoulder, not elsewhere classified: Secondary | ICD-10-CM

## 2019-03-10 DIAGNOSIS — M6281 Muscle weakness (generalized): Secondary | ICD-10-CM

## 2019-03-10 NOTE — Therapy (Signed)
[facility] [address], [place], [place], [address] Phone: [phone]   Fax:  [phone]  Physical Therapy Treatment  Patient Details  Name: Samantha Curtis MRN: [id number] Date of Birth: 1965/11/22 Referring Provider (PT): Melrose Nakayama, MD   Encounter Date: 03/10/2019  PT End of Session - 03/10/19 1536    Visit Number  12    Number of Visits  17    Date for PT Re-Evaluation  03/18/19    Authorization Type  W/C 17 visits approved    Authorization - Visit Number  6    Authorization - Number of Visits  9    PT Start Time  1440    PT Stop Time  1525    PT Time Calculation (min)  45 min    Activity Tolerance  Patient tolerated treatment well    Behavior During Therapy  [facility] for tasks assessed/performed       Past Medical History:  Diagnosis Date  . Arthritis    "knees" (07/04/2017)  . Dysrhythmia    PALPITATIONS  . GERD (gastroesophageal reflux disease)   . High cholesterol   . Leg pain     Past Surgical History:  Procedure Laterality Date  . ENDOVENOUS ABLATION SAPHENOUS VEIN W/ LASER Left 2005   [facility]  . JOINT REPLACEMENT    . KNEE ARTHROSCOPY Right   . SHOULDER ARTHROSCOPY Right    "spurs"  . TOTAL KNEE ARTHROPLASTY Bilateral 07/03/2017   Procedure: TOTAL KNEE BILATERAL;  Surgeon: Melrose Nakayama, MD;  Location: [facility];  Service: Orthopedics;  Laterality: Bilateral;  . TUBAL LIGATION    . VAGINAL HYSTERECTOMY      There were no vitals filed for this visit.  Subjective Assessment - 03/10/19 1445    Subjective  Pt will see the doctor on Wednesday to discuss results but she has seen it in her mychart.  Pt feels concerned that she will do something to make it worse    Currently in Pain?  Yes    Pain Score  5     Pain Location  Shoulder    Pain Orientation  Right                       [facility] Adult PT Treatment/Exercise - 03/10/19 0001      Shoulder Exercises: Prone   Horizontal ABduction 1  Strengthening;Right;20 reps;Weights    Horizontal ABduction 1 Weight (lbs)  1    Other Prone Exercises  prone extension and row - 3lb - 2x10      Shoulder Exercises: Sidelying   ABduction  Strengthening;Right;20 reps;Weights    ABduction Weight (lbs)  1      Shoulder Exercises: Standing   Extension  Strengthening;Right;Left;20 reps;Theraband    Theraband Level (Shoulder Extension)  Level 1 (Yellow)    Row  Strengthening;Right;Left;20 reps;Theraband    Theraband Level (Shoulder Row)  Level 2 (Red);Level 3 (Green)   switched to green for a second set     Shoulder Exercises: Stretch   Other Shoulder Stretches  inferior shoulder capsule stretch with shoulder pressing down to mat      Manual Therapy   Soft tissue mobilization  Rt upper trap and Rt cervical paraspinals - trigger point release to Rt upper trap attachment; Rt scalenes      Kinesiotix   Inhibit Muscle   I strip on upper trap    Facilitate Muscle   I strip anterior deltoid and  supraspinatus, infraspinatus and teres minor with anchor anterior joint capsule               PT Short Term Goals - 02/14/19 1159      PT SHORT TERM GOAL #1   Title  independent with initial HEP    Status  Achieved      PT SHORT TERM GOAL #2   Title  AROM Rt shoulder flexion 120    Baseline  128    Status  Achieved        PT Long Term Goals - 03/05/19 1611      PT LONG TERM GOAL #1   Title  Independent with advanced HEP    Status  On-going      PT LONG TERM GOAL #2   Title  increased AROM to 160 deg Rt shoulder flexion without increased pain    Baseline  124    Status  On-going      PT LONG TERM GOAL #3   Title  pt will be able to perform MMT Rt shoulder flexion and abduction 5/5 without pain for return to functional activities at work    Baseline  currently pain with AROM to 90 deg    Status  On-going      PT LONG TERM GOAL #4   Title  FOTO </= to 25% limited    Status  On-going      PT LONG  TERM GOAL #5   Title  Pt will be able to lift 20 lb up to waist height for return to full function at work    Status  On-going            Plan - 03/10/19 1539    Clinical Impression Statement  Pt tolerated the exercises today.  She had some increased pain with sidelying abduction after 15 reps, but increased pain level did not persist after  the exercise was stopped.  Pt is generally very irritable to soft tissues around the Rt shoulder . She could only tolerate very little amount of pressure to the cervical paraspinals.  She had a large trigger point close to the upper trap attachment and loosened and responded well to STM. Pt will continue to benefit from skilled PT to improve strenth and help with management of pain and inflammation.    PT Treatment/Interventions  ADLs/Self Care Home Management;Biofeedback;Electrical Stimulation;Iontophoresis 4mg /ml Dexamethasone;Moist Heat;Ultrasound;Therapeutic activities;Therapeutic exercise;Neuromuscular re-education;Manual techniques;Passive range of motion;Dry needling;Taping;Patient/family education    PT Next Visit Plan  DN as needed;  capsule stretches; pain education;   prone I, Ts;  scapular strengthening;  KT    PT Home Exercise Plan  Access Code: [email or web address]    Recommended Other Services  initial cert signed    Consulted and Agree with Plan of Care  Patient       Patient will benefit from skilled therapeutic intervention in order to improve the following deficits and impairments:  Pain, Impaired UE functional use, Increased muscle spasms, Decreased range of motion, Decreased strength  Visit Diagnosis: Acute pain of right shoulder  Muscle weakness (generalized)  Stiffness of right shoulder, not elsewhere classified  Other muscle spasm     Problem List Patient Active Problem List   Diagnosis Date Noted  . S/P TKR (total knee replacement), bilateral   . Gastroesophageal reflux disease   . Post-operative pain   . Elevated blood  pressure reading   . Bilateral primary osteoarthritis of knee 07/03/2017  . Lateral epicondylitis of right elbow 04/02/2013  Junious Silk, PT 03/10/2019, 3:46 PM  [facility] [address], [address] [place], [place], [address] Phone: [phone]   Fax:  [phone]  Name: Samantha Curtis MRN: [id number] Date of Birth: 17-Dec-1965

## 2019-03-12 ENCOUNTER — Encounter: Payer: Self-pay | Admitting: Physical Therapy

## 2019-03-12 ENCOUNTER — Other Ambulatory Visit: Payer: Self-pay

## 2019-03-12 ENCOUNTER — Ambulatory Visit: Payer: PRIVATE HEALTH INSURANCE | Admitting: Physical Therapy

## 2019-03-12 DIAGNOSIS — M25511 Pain in right shoulder: Secondary | ICD-10-CM | POA: Diagnosis not present

## 2019-03-12 DIAGNOSIS — M25611 Stiffness of right shoulder, not elsewhere classified: Secondary | ICD-10-CM

## 2019-03-12 DIAGNOSIS — M62838 Other muscle spasm: Secondary | ICD-10-CM

## 2019-03-12 DIAGNOSIS — M6281 Muscle weakness (generalized): Secondary | ICD-10-CM

## 2019-03-12 NOTE — Therapy (Signed)
[facility] [address], [address] [place], [place], [address] Phone: [phone]   Fax:  [phone]  Physical Therapy Treatment  Patient Details  Name: Samantha Curtis MRN: [id number] Date of Birth: 05-27-1966 Referring Provider (PT): Marcene Corning, MD   Encounter Date: 03/12/2019  PT End of Session - 03/12/19 1611    Visit Number  13    Number of Visits  25    Date for PT Re-Evaluation  03/18/19    Authorization Type  W/C 25 visits approved    PT Start Time  1611    PT Stop Time  1708    PT Time Calculation (min)  57 min       Past Medical History:  Diagnosis Date  . Arthritis    "knees" (07/04/2017)  . Dysrhythmia    PALPITATIONS  . GERD (gastroesophageal reflux disease)   . High cholesterol   . Leg pain     Past Surgical History:  Procedure Laterality Date  . ENDOVENOUS ABLATION SAPHENOUS VEIN W/ LASER Left 2005   [facility]  . JOINT REPLACEMENT    . KNEE ARTHROSCOPY Right   . SHOULDER ARTHROSCOPY Right    "spurs"  . TOTAL KNEE ARTHROPLASTY Bilateral 07/03/2017   Procedure: TOTAL KNEE BILATERAL;  Surgeon: Marcene Corning, MD;  Location: [facility];  Service: Orthopedics;  Laterality: Bilateral;  . TUBAL LIGATION    . VAGINAL HYSTERECTOMY      There were no vitals filed for this visit.  Subjective Assessment - 03/12/19 1659    Subjective  Pt just saw the MD and got an injection prior to today's treatment.  She reports feeling very sore andin a lot of pain.  8 more visits approved by [facility]    Patient Stated Goals  be able to do what I need to do without pain and make sure there is no tear    Currently in Pain?  Yes    Pain Score  9     Pain Location  Shoulder    Pain Orientation  Right    Pain Descriptors / Indicators  Sore;Tightness                       [facility] Adult PT Treatment/Exercise - 03/12/19 0001      Self-Care   Other Self-Care Comments   educated on how to use home TENS unit  - settings and pad placement; educated on pain response "alarm system"      Shoulder Exercises: Prone   Retraction  --    Retraction Weight (lbs)  --    Extension  --    Extension Weight (lbs)  --    Horizontal ABduction 1  --    Horizontal ABduction 1 Weight (lbs)  --      Shoulder Exercises: Sidelying   External Rotation  Strengthening;Right;10 reps    ABduction  Strengthening;Right;20 reps;Weights    ABduction Weight (lbs)  1      Electrical Stimulation   Electrical Stimulation Location  right shoulder     Electrical Stimulation Action  IFC    Electrical Stimulation Parameters  35ma 10 min    Electrical Stimulation Goals  Pain      Manual Therapy   Soft tissue mobilization  Rt upper trap, rhomboids, thoracic and cervical paraspinals, very light to Rt RTC               PT Short Term Goals - 02/14/19 1159  PT SHORT TERM GOAL #1   Title  independent with initial HEP    Status  Achieved      PT SHORT TERM GOAL #2   Title  AROM Rt shoulder flexion 120    Baseline  128    Status  Achieved        PT Long Term Goals - 03/05/19 1611      PT LONG TERM GOAL #1   Title  Independent with advanced HEP    Status  On-going      PT LONG TERM GOAL #2   Title  increased AROM to 160 deg Rt shoulder flexion without increased pain    Baseline  124    Status  On-going      PT LONG TERM GOAL #3   Title  pt will be able to perform MMT Rt shoulder flexion and abduction 5/5 without pain for return to functional activities at work    Baseline  currently pain with AROM to 90 deg    Status  On-going      PT LONG TERM GOAL #4   Title  FOTO </= to 25% limited    Status  On-going      PT LONG TERM GOAL #5   Title  Pt will be able to lift 20 lb up to waist height for return to full function at work    Status  On-going            Plan - 03/12/19 1700    Clinical Impression Statement  Pt could not tolerate much exercise today due to increased pain.  STM helped in  thoracic region but upper trap was remaining tight and only tolerated very light pressure.  Pt was able to understand how to use home TENS unit. She appears to have pain that is more widespread than place of injury.  Pt received some education on this type of pain response and she states she understands that it feels worse than it is.  Pt will continue to benefit from skilled PT to work on strength and pain management.    PT Treatment/Interventions  ADLs/Self Care Home Management;Biofeedback;Electrical Stimulation;Iontophoresis 4mg /ml Dexamethasone;Moist Heat;Ultrasound;Therapeutic activities;Therapeutic exercise;Neuromuscular re-education;Manual techniques;Passive range of motion;Dry needling;Taping;Patient/family education    PT Next Visit Plan  DN as needed;  capsule stretches; pain education;   prone I, Ts;  scapular strengthening;  KT    PT Home Exercise Plan  Access Code: [email or web address]    Consulted and Agree with Plan of Care  Patient       Patient will benefit from skilled therapeutic intervention in order to improve the following deficits and impairments:  Pain, Impaired UE functional use, Increased muscle spasms, Decreased range of motion, Decreased strength  Visit Diagnosis: Acute pain of right shoulder  Muscle weakness (generalized)  Stiffness of right shoulder, not elsewhere classified  Other muscle spasm     Problem List Patient Active Problem List   Diagnosis Date Noted  . S/P TKR (total knee replacement), bilateral   . Gastroesophageal reflux disease   . Post-operative pain   . Elevated blood pressure reading   . Bilateral primary osteoarthritis of knee 07/03/2017  . Lateral epicondylitis of right elbow 04/02/2013    Jule Ser, PT  03/12/2019, 5:10 PM  [facility] [address] [place], [place], [address] Phone: [phone]   Fax:  [phone]  Name: Samantha Curtis MRN: [id number] Date of Birth:  23-Oct-1965

## 2019-03-14 MED FILL — ATORVASTATIN 10 MG TABLET: 10 | 90 days supply | Qty: 90 | Fill #0

## 2019-03-18 ENCOUNTER — Other Ambulatory Visit: Payer: Self-pay

## 2019-03-18 ENCOUNTER — Ambulatory Visit: Payer: PRIVATE HEALTH INSURANCE | Attending: Orthopedic Surgery | Admitting: Physical Therapy

## 2019-03-18 DIAGNOSIS — M25511 Pain in right shoulder: Secondary | ICD-10-CM | POA: Diagnosis not present

## 2019-03-18 DIAGNOSIS — M25611 Stiffness of right shoulder, not elsewhere classified: Secondary | ICD-10-CM | POA: Insufficient documentation

## 2019-03-18 DIAGNOSIS — M6281 Muscle weakness (generalized): Secondary | ICD-10-CM | POA: Diagnosis present

## 2019-03-18 DIAGNOSIS — M62838 Other muscle spasm: Secondary | ICD-10-CM | POA: Insufficient documentation

## 2019-03-18 NOTE — Therapy (Signed)
[facility] [address] [place], [place], [address] Phone: [phone]   Fax:  [phone]  Physical Therapy Treatment/Recertification   Patient Details  Name: Samantha Curtis MRN: [id number] Date of Birth: 1966/03/01 Referring Provider (PT): Marcene Corning, MD   Encounter Date: 03/18/2019  PT End of Session - 03/18/19 1506    Visit Number  14    Number of Visits  25    Date for PT Re-Evaluation  05/13/19    Authorization Type  W/C 25 visits approved    Authorization - Visit Number  14    Authorization - Number of Visits  25    PT Start Time  1425    PT Stop Time  1508    PT Time Calculation (min)  43 min    Activity Tolerance  Patient tolerated treatment well       Past Medical History:  Diagnosis Date  . Arthritis    "knees" (07/04/2017)  . Dysrhythmia    PALPITATIONS  . GERD (gastroesophageal reflux disease)   . High cholesterol   . Leg pain     Past Surgical History:  Procedure Laterality Date  . ENDOVENOUS ABLATION SAPHENOUS VEIN W/ LASER Left 2005   [facility]  . JOINT REPLACEMENT    . KNEE ARTHROSCOPY Right   . SHOULDER ARTHROSCOPY Right    "spurs"  . TOTAL KNEE ARTHROPLASTY Bilateral 07/03/2017   Procedure: TOTAL KNEE BILATERAL;  Surgeon: Marcene Corning, MD;  Location: [facility];  Service: Orthopedics;  Laterality: Bilateral;  . TUBAL LIGATION    . VAGINAL HYSTERECTOMY      There were no vitals filed for this visit.  Subjective Assessment - 03/18/19 1425    Subjective  He did another injection.  It was so painful.  I used my TENS that night.  I've been doing better until today.  I did too much today.  He said there is a little tear and a lot of inflammation.  If the injection doesn't work he would do a scope.    Currently in Pain?  Yes    Pain Score  4     Pain Location  Shoulder    Pain Orientation  Right    Pain Type  Acute pain         [facility] PT Assessment - 03/18/19 0001       Observation/Other Assessments   Focus on Therapeutic Outcomes (FOTO)   49% limitation       AROM   Right Shoulder Flexion  143 Degrees    Right Shoulder ABduction  122 Degrees    Right Shoulder Internal Rotation  --   T10   Right Shoulder External Rotation  78 Degrees      Strength   Right Shoulder Flexion  4+/5    Right Shoulder ABduction  4+/5    Right Shoulder External Rotation  4+/5                   [facility] Adult PT Treatment/Exercise - 03/18/19 0001      Shoulder Exercises: Supine   Other Supine Exercises  UE Ranger supine 10x     Other Supine Exercises  lying on blue foam roll with palms up for gentle pectoral stretch and thoracic extension with foam roll horizontally 10x       Shoulder Exercises: Prone   Horizontal ABduction 1  Strengthening;Right;20 reps;Weights    Horizontal ABduction 1 Weight (lbs)  1  Other Prone Exercises  bent over shoulder extension 2# 15x    Other Prone Exercises  bent over triceps extension 2# 10x       Shoulder Exercises: Standing   Row  Strengthening;Both;20 reps;Weights    Row Weight (lbs)  15      Shoulder Exercises: Pulleys   Flexion  2 minutes      Shoulder Exercises: ROM/Strengthening   Ranger  on wall L15 10x       Shoulder Exercises: Stretch   Corner Stretch  3 reps;20 seconds      Manual Therapy   Soft tissue mobilization  Addaday instrument assisted to right upper trap 3 minutes                PT Short Term Goals - 02/14/19 1159      PT SHORT TERM GOAL #1   Title  independent with initial HEP    Status  Achieved      PT SHORT TERM GOAL #2   Title  AROM Rt shoulder flexion 120    Baseline  128    Status  Achieved        PT Long Term Goals - 03/18/19 1654      PT LONG TERM GOAL #1   Title  Independent with advanced HEP    Time  8    Period  Weeks    Status  On-going    Target Date  05/13/19      PT LONG TERM GOAL #2   Title  increased AROM to 160 deg Rt shoulder flexion without  increased pain    Time  8    Period  Weeks    Status  On-going      PT LONG TERM GOAL #3   Title  pt will be able to perform MMT Rt shoulder flexion and abduction 5/5 without pain for return to functional activities at work    Time  8    Period  Weeks    Status  On-going      PT LONG TERM GOAL #4   Title  FOTO </= to 25% limited    Time  8    Period  Weeks    Status  On-going      PT LONG TERM GOAL #5   Title  Pt will be able to lift 20 lb up to waist height for return to full function at work    Time  8    Period  Weeks            Plan - 03/18/19 1647    Clinical Impression Statement  The patient continues to be quite pain limited but able to participate in ROM ex's (gentle) to encourage going to endrange.   Her ROM continues to be quite limited.   Discussed risk for factors for developing a stiff shoulder and the importance of improving ROM and strengthening for a better long term outcome regardless of whether she has surgery or not.  Emphasized periscapular strengthening to decrease work load on rotator cuff muscles.  Her FOTO functional outcome score has worsened since initial evaluation from 35% limitation to 49% perhaps secondary to central sensitization. Slower progress with rehab goals due to high symptom irritability.  She will need graded exposure with exercise.    Examination-Activity Limitations  Reach Overhead;Lift;Dressing;Caring for Others;Carry    PT Frequency  2x / week    PT Duration  8 weeks    PT Treatment/Interventions  ADLs/Self Care  Home Management;Biofeedback;Electrical Stimulation;Iontophoresis 4mg /ml Dexamethasone;Moist Heat;Ultrasound;Therapeutic activities;Therapeutic exercise;Neuromuscular re-education;Manual techniques;Passive range of motion;Dry needling;Taping;Patient/family education    PT Next Visit Plan  ROM;  DN as needed; graded exposure to ex secondary to high symptom sensitivity;  capsule stretches; pain education;   prone I, Ts;  scapular  strengthening;  KT    PT Home Exercise Plan  Access Code: [email or web address]       Patient will benefit from skilled therapeutic intervention in order to improve the following deficits and impairments:  Pain, Impaired UE functional use, Increased muscle spasms, Decreased range of motion, Decreased strength  Visit Diagnosis: Acute pain of right shoulder - Plan: PT plan of care cert/re-cert, CANCELED: PT plan of care cert/re-cert  Muscle weakness (generalized) - Plan: PT plan of care cert/re-cert, CANCELED: PT plan of care cert/re-cert  Stiffness of right shoulder, not elsewhere classified - Plan: PT plan of care cert/re-cert, CANCELED: PT plan of care cert/re-cert  Other muscle spasm - Plan: PT plan of care cert/re-cert, CANCELED: PT plan of care cert/re-cert     Problem List Patient Active Problem List   Diagnosis Date Noted  . S/P TKR (total knee replacement), bilateral   . Gastroesophageal reflux disease   . Post-operative pain   . Elevated blood pressure reading   . Bilateral primary osteoarthritis of knee 07/03/2017  . Lateral epicondylitis of right elbow 04/02/2013   Lavinia Sharps , PT 03/18/19 5:31 PM Phone: [phone] Fax: [phone] Vivien Presto,  C 03/18/2019, 5:30 PM  [facility] [address] [place], [place], [address] Phone: [phone]   Fax:  [phone]  Name: Gevena MartRana S Meiring MRN: [id number] Date of Birth: Sep 16, 1965

## 2019-03-20 ENCOUNTER — Other Ambulatory Visit: Payer: Self-pay

## 2019-03-20 ENCOUNTER — Ambulatory Visit: Payer: PRIVATE HEALTH INSURANCE | Attending: Orthopedic Surgery | Admitting: Physical Therapy

## 2019-03-20 DIAGNOSIS — M6281 Muscle weakness (generalized): Secondary | ICD-10-CM | POA: Insufficient documentation

## 2019-03-20 DIAGNOSIS — M25511 Pain in right shoulder: Secondary | ICD-10-CM | POA: Insufficient documentation

## 2019-03-20 DIAGNOSIS — M25611 Stiffness of right shoulder, not elsewhere classified: Secondary | ICD-10-CM | POA: Insufficient documentation

## 2019-03-20 DIAGNOSIS — M62838 Other muscle spasm: Secondary | ICD-10-CM | POA: Insufficient documentation

## 2019-03-20 NOTE — Therapy (Signed)
[facility] [address] [place], [place], [address] Phone: [phone]   Fax:  [phone]  Physical Therapy Treatment  Patient Details  Name: Samantha Curtis MRN: [id number] Date of Birth: 11-02-1965 Referring Provider (PT): Marcene Corning, MD   Encounter Date: 03/20/2019  PT End of Session - 03/20/19 1605    Visit Number  15    Number of Visits  25    Date for PT Re-Evaluation  05/13/19    Authorization Type  W/C 25 visits approved    Authorization - Visit Number  15    Authorization - Number of Visits  25    PT Start Time  1557    PT Stop Time  1636    PT Time Calculation (min)  39 min    Activity Tolerance  Patient tolerated treatment well    Behavior During Therapy  [facility] for tasks assessed/performed       Past Medical History:  Diagnosis Date  . Arthritis    "knees" (07/04/2017)  . Dysrhythmia    PALPITATIONS  . GERD (gastroesophageal reflux disease)   . High cholesterol   . Leg pain     Past Surgical History:  Procedure Laterality Date  . ENDOVENOUS ABLATION SAPHENOUS VEIN W/ LASER Left 2005   [facility]  . JOINT REPLACEMENT    . KNEE ARTHROSCOPY Right   . SHOULDER ARTHROSCOPY Right    "spurs"  . TOTAL KNEE ARTHROPLASTY Bilateral 07/03/2017   Procedure: TOTAL KNEE BILATERAL;  Surgeon: Marcene Corning, MD;  [facility];  Service: Orthopedics;  Laterality: Bilateral;  . TUBAL LIGATION    . VAGINAL HYSTERECTOMY      There were no vitals filed for this visit.  Subjective Assessment - 03/20/19 1600    Subjective  So far it feels like the injection is helping more now.    Limitations  House hold activities;Lifting    Currently in Pain?  Yes    Pain Score  4     Pain Location  Shoulder    Pain Orientation  Right                       [facility] Adult PT Treatment/Exercise - 03/20/19 0001      Shoulder Exercises: Prone   Other Prone Exercises  bent over shoulder  extension no weight due to pain 15x needs lots of cues to keep from upper trap and winging scapula      Shoulder Exercises: Standing   Row  Strengthening;Both;20 reps;Weights   bent over mat table   Row Limitations  2 lb free weight    Other Standing Exercises  bicep curl using foam roll behind for posture    Other Standing Exercises  pball rolling up the wall - 10x      Shoulder Exercises: Pulleys   Flexion  2 minutes      Shoulder Exercises: ROM/Strengthening   Ranger  on wall L22 10x       Shoulder Exercises: Stretch   Other Shoulder Stretches  roll out with red pball      Manual Therapy   Soft tissue mobilization  Addaday instrument assisted to right upper trap, infraspinatus, rhomboids               PT Short Term Goals - 02/14/19 1159      PT SHORT TERM GOAL #1   Title  independent with initial HEP    Status  Achieved  PT SHORT TERM GOAL #2   Title  AROM Rt shoulder flexion 120    Baseline  128    Status  Achieved        PT Long Term Goals - 03/18/19 1654      PT LONG TERM GOAL #1   Title  Independent with advanced HEP    Time  8    Period  Weeks    Status  On-going    Target Date  05/13/19      PT LONG TERM GOAL #2   Title  increased AROM to 160 deg Rt shoulder flexion without increased pain    Time  8    Period  Weeks    Status  On-going      PT LONG TERM GOAL #3   Title  pt will be able to perform MMT Rt shoulder flexion and abduction 5/5 without pain for return to functional activities at work    Time  8    Period  Weeks    Status  On-going      PT LONG TERM GOAL #4   Title  FOTO </= to 25% limited    Time  8    Period  Weeks    Status  On-going      PT LONG TERM GOAL #5   Title  Pt will be able to lift 20 lb up to waist height for return to full function at work    Time  8    Period  Weeks            Plan - 03/20/19 1611    Clinical Impression Statement  Pt was feeling better today.  Treatment emphasized ROM and gentle  strengthening exercises.  She has full flexion ROM today.  Pt still guards with extreme rouding of her shoulder and increased thoracic kyphosis.  She needed cues throughout for improved posture    PT Treatment/Interventions  ADLs/Self Care Home Management;Biofeedback;Electrical Stimulation;Iontophoresis 4mg /ml Dexamethasone;Moist Heat;Ultrasound;Therapeutic activities;Therapeutic exercise;Neuromuscular re-education;Manual techniques;Passive range of motion;Dry needling;Taping;Patient/family education    PT Next Visit Plan  ROM;  DN as needed; graded exposure to ex secondary to high symptom sensitivity;  capsule stretches; pain education;   prone I, Ts;  scapular strengthening;  KT    PT Home Exercise Plan  Access Code: [email or web address]    Consulted and Agree with Plan of Care  Patient       Patient will benefit from skilled therapeutic intervention in order to improve the following deficits and impairments:  Pain, Impaired UE functional use, Increased muscle spasms, Decreased range of motion, Decreased strength  Visit Diagnosis: No diagnosis found.     Problem List Patient Active Problem List   Diagnosis Date Noted  . S/P TKR (total knee replacement), bilateral   . Gastroesophageal reflux disease   . Post-operative pain   . Elevated blood pressure reading   . Bilateral primary osteoarthritis of knee 07/03/2017  . Lateral epicondylitis of right elbow 04/02/2013    Jule Ser, PT 03/20/2019, 4:57 PM  [facility] [address], [place], [place], [address] Phone: [phone]   Fax:  [phone]  Name: Samantha Curtis MRN: [id number] Date of Birth: December 25, 1965

## 2019-03-21 MED FILL — TRIAMCINOLONE 0.1% CREAM: 0.1 | 30 days supply | Qty: 45 | Fill #0

## 2019-03-25 ENCOUNTER — Other Ambulatory Visit: Payer: Self-pay

## 2019-03-25 ENCOUNTER — Ambulatory Visit: Payer: PRIVATE HEALTH INSURANCE | Attending: Orthopaedic Surgery | Admitting: Physical Therapy

## 2019-03-25 DIAGNOSIS — M62838 Other muscle spasm: Secondary | ICD-10-CM | POA: Diagnosis present

## 2019-03-25 DIAGNOSIS — M25511 Pain in right shoulder: Secondary | ICD-10-CM | POA: Diagnosis not present

## 2019-03-25 DIAGNOSIS — M6281 Muscle weakness (generalized): Secondary | ICD-10-CM | POA: Insufficient documentation

## 2019-03-25 DIAGNOSIS — M25611 Stiffness of right shoulder, not elsewhere classified: Secondary | ICD-10-CM | POA: Diagnosis present

## 2019-03-25 NOTE — Therapy (Signed)
[facility] [address] [place], [place], [address] Phone: [phone]   Fax:  [phone]  Physical Therapy Treatment  Patient Details  Name: Samantha Curtis MRN: [id number] Date of Birth: 05-24-1966 Referring Provider (PT): Marcene Corning, MD   Encounter Date: 03/25/2019  PT End of Session - 03/25/19 1446    Visit Number  16    Number of Visits  25    Date for PT Re-Evaluation  05/13/19    Authorization Type  W/C 25 visits approved    Authorization - Visit Number  16    Authorization - Number of Visits  25    PT Start Time  1445    PT Stop Time  1530    PT Time Calculation (min)  45 min    Activity Tolerance  Patient tolerated treatment well    Behavior During Therapy  [facility] for tasks assessed/performed       Past Medical History:  Diagnosis Date  . Arthritis    "knees" (07/04/2017)  . Dysrhythmia    PALPITATIONS  . GERD (gastroesophageal reflux disease)   . High cholesterol   . Leg pain     Past Surgical History:  Procedure Laterality Date  . ENDOVENOUS ABLATION SAPHENOUS VEIN W/ LASER Left 2005   [facility]  . JOINT REPLACEMENT    . KNEE ARTHROSCOPY Right   . SHOULDER ARTHROSCOPY Right    "spurs"  . TOTAL KNEE ARTHROPLASTY Bilateral 07/03/2017   Procedure: TOTAL KNEE BILATERAL;  Surgeon: Marcene Corning, MD;  [facility];  Service: Orthopedics;  Laterality: Bilateral;  . TUBAL LIGATION    . VAGINAL HYSTERECTOMY      There were no vitals filed for this visit.      [facility] PT Assessment - 03/25/19 0001      AROM   Right Shoulder Flexion  125 Degrees                   [facility] Adult PT Treatment/Exercise - 03/25/19 0001      Shoulder Exercises: Supine   Other Supine Exercises  shoulder extension yellow band - 20x    Other Supine Exercises  lying on blue foam roll with palms up for gentle pectoral stretch and thoracic extension with foam roll horizontally 10x       Shoulder Exercises: Standing   Extension  Strengthening;Right;20 reps;Weights   bent over mat table   Extension Weight (lbs)  2    Row  Strengthening;Both;20 reps;Weights   bent over mat table   Row Limitations  2 lb free weight    Other Standing Exercises  bicep curl using foam roll behind for posture   2 lb   Other Standing Exercises  W's yellow band in doorway - 20x      Shoulder Exercises: Pulleys   Flexion  2 minutes      Shoulder Exercises: ROM/Strengthening   Ranger  on wall L22 10x     Other ROM/Strengthening Exercises  finger ladder to #19 x 8 (shoulder at 140 deg flexion)      Shoulder Exercises: Stretch   Corner Stretch  3 reps;20 seconds      Cryotherapy   Number Minutes Cryotherapy  15 Minutes    Cryotherapy Location  Shoulder    Type of Cryotherapy  Ice pack      Electrical Stimulation   Electrical Stimulation Location  right shoulder     Electrical Stimulation Action  IFC  Electrical Stimulation Parameters  10 ma x 15 min    Electrical Stimulation Goals  Pain               PT Short Term Goals - 02/14/19 1159      PT SHORT TERM GOAL #1   Title  independent with initial HEP    Status  Achieved      PT SHORT TERM GOAL #2   Title  AROM Rt shoulder flexion 120    Baseline  128    Status  Achieved        PT Long Term Goals - 03/25/19 1448      PT LONG TERM GOAL #2   Title  increased AROM to 160 deg Rt shoulder flexion without increased pain    Baseline  125    Status  On-going            Plan - 03/25/19 1525    Clinical Impression Statement  Pt was able to increase resistance slightly today.  She continues to need cues for posture throughout session.  AAROM is measured at 140 deg today and AROM was 125 deg.  Pt will continue to benefit from skilled PT to progres strength while managing for pain and inflammation.    PT Treatment/Interventions  ADLs/Self Care Home Management;Biofeedback;Electrical Stimulation;Iontophoresis 4mg /ml  Dexamethasone;Moist Heat;Ultrasound;Therapeutic activities;Therapeutic exercise;Neuromuscular re-education;Manual techniques;Passive range of motion;Dry needling;Taping;Patient/family education    PT Next Visit Plan  ROM;  DN as needed; graded exposure to ex secondary to high symptom sensitivity;  capsule stretches; pain education;   prone I, Ts;  scapular strengthening;  KT    PT Home Exercise Plan  Access Code: [email or web address]    Consulted and Agree with Plan of Care  Patient       Patient will benefit from skilled therapeutic intervention in order to improve the following deficits and impairments:  Pain, Impaired UE functional use, Increased muscle spasms, Decreased range of motion, Decreased strength  Visit Diagnosis: Acute pain of right shoulder  Muscle weakness (generalized)  Stiffness of right shoulder, not elsewhere classified  Other muscle spasm     Problem List Patient Active Problem List   Diagnosis Date Noted  . S/P TKR (total knee replacement), bilateral   . Gastroesophageal reflux disease   . Post-operative pain   . Elevated blood pressure reading   . Bilateral primary osteoarthritis of knee 07/03/2017  . Lateral epicondylitis of right elbow 04/02/2013    Jule Ser, PT 03/25/2019, 3:28 PM  [facility] [address], [place], [place], [address] Phone: [phone]   Fax:  [phone]  Name: ARIANNY PUN MRN: [id number] Date of Birth: 1966/04/04

## 2019-03-27 ENCOUNTER — Ambulatory Visit: Payer: PRIVATE HEALTH INSURANCE | Admitting: Physical Therapy

## 2019-03-27 ENCOUNTER — Other Ambulatory Visit: Payer: Self-pay

## 2019-03-27 ENCOUNTER — Encounter: Payer: Self-pay | Admitting: Physical Therapy

## 2019-03-27 DIAGNOSIS — M25611 Stiffness of right shoulder, not elsewhere classified: Secondary | ICD-10-CM

## 2019-03-27 DIAGNOSIS — M6281 Muscle weakness (generalized): Secondary | ICD-10-CM

## 2019-03-27 DIAGNOSIS — M25511 Pain in right shoulder: Secondary | ICD-10-CM

## 2019-03-27 DIAGNOSIS — M62838 Other muscle spasm: Secondary | ICD-10-CM

## 2019-03-27 NOTE — Therapy (Signed)
[facility] [address] [place], [place], [address] Phone: [phone]   Fax:  [phone]  Physical Therapy Treatment  Patient Details  Name: Samantha Curtis MRN: [id number] Date of Birth: 05/15/1966 Referring Provider (PT): Melrose Nakayama, MD   Encounter Date: 03/27/2019  PT End of Session - 03/27/19 1504    Visit Number  17    Number of Visits  25    Date for PT Re-Evaluation  05/13/19    Authorization Type  W/C 25 visits approved    Authorization - Visit Number  73    Authorization - Number of Visits  25    PT Start Time  4481    PT Stop Time  1523    PT Time Calculation (min)  38 min    Activity Tolerance  Patient limited by pain       Past Medical History:  Diagnosis Date  . Arthritis    "knees" (07/04/2017)  . Dysrhythmia    PALPITATIONS  . GERD (gastroesophageal reflux disease)   . High cholesterol   . Leg pain     Past Surgical History:  Procedure Laterality Date  . ENDOVENOUS ABLATION SAPHENOUS VEIN W/ LASER Left 2005   [facility]  . JOINT REPLACEMENT    . KNEE ARTHROSCOPY Right   . SHOULDER ARTHROSCOPY Right    "spurs"  . TOTAL KNEE ARTHROPLASTY Bilateral 07/03/2017   Procedure: TOTAL KNEE BILATERAL;  Surgeon: Melrose Nakayama, MD;  Location: [place];  Service: Orthopedics;  Laterality: Bilateral;  . TUBAL LIGATION    . VAGINAL HYSTERECTOMY      There were no vitals filed for this visit.  Subjective Assessment - 03/27/19 1444    Subjective  Today it hurts more than the previous part of the week.  It's back to where I was before the injection.  It helped with the spasms but the sharp pain is back.  Uses home TENs every day after work.    Currently in Pain?  Yes    Pain Score  6     Pain Location  Shoulder    Pain Orientation  Right    Pain Type  Acute pain                       [facility] Adult PT Treatment/Exercise - 03/27/19 0001      Shoulder Exercises: Supine    Other Supine Exercises  lying on foam roll with bil UE elevation     Other Supine Exercises  lying on blue foam roll with palms up for gentle pectoral stretch and thoracic extension with foam roll horizontally 10x       Shoulder Exercises: Seated   Other Seated Exercises  right upper trap stretch 3x 20 sec       Shoulder Exercises: Standing   Extension  Strengthening;Right;20 reps;Theraband    Theraband Level (Shoulder Extension)  Level 3 (Green)    Row  Strengthening;Right;20 reps;Theraband    Theraband Level (Shoulder Row)  Level 3 (Green)    Other Standing Exercises  lacrosse ball levator scap region against wall with cervical ROM 3 minutes     Other Standing Exercises  green band triceps extension 20x       Shoulder Exercises: ROM/Strengthening   Ranger  on wall L22 10x       Ultrasound   Ultrasound Location  right subacromial region and right upper trap     Ultrasound Parameters  1.2  w/cm2 100% 5 min to each area    Ultrasound Goals  Pain               PT Short Term Goals - 02/14/19 1159      PT SHORT TERM GOAL #1   Title  independent with initial HEP    Status  Achieved      PT SHORT TERM GOAL #2   Title  AROM Rt shoulder flexion 120    Baseline  128    Status  Achieved        PT Long Term Goals - 03/25/19 1448      PT LONG TERM GOAL #2   Title  increased AROM to 160 deg Rt shoulder flexion without increased pain    Baseline  125    Status  On-going            Plan - 03/27/19 1528    Clinical Impression Statement  The patient reports increased pain today following a busy day at work.  She grimaces and frequently rubs her shoulder and neck throughout treatment session.  Despite numerous modifications to ex program, she continues to complain of shoulder pain most likely as a response to central sensitization.  Verbal and tactile cues needed for scapular retraction/depression and avoidance of compensatory shoulder shrug.  She reports decreased pain  following U/S.    Examination-Activity Limitations  Reach Overhead;Lift;Dressing;Caring for Others;Carry    PT Frequency  2x / week    PT Duration  8 weeks    PT Treatment/Interventions  ADLs/Self Care Home Management;Biofeedback;Electrical Stimulation;Iontophoresis 4mg /ml Dexamethasone;Moist Heat;Ultrasound;Therapeutic activities;Therapeutic exercise;Neuromuscular re-education;Manual techniques;Passive range of motion;Dry needling;Taping;Patient/family education    PT Next Visit Plan  recheck ROM next visit;  graded exposure to ex secondary to high symptom sensitivity;  capsule stretches; pain education;   prone I, Ts;  scapular strengthening;  KT;     PT Home Exercise Plan  Access Code: [facility]       Patient will benefit from skilled therapeutic intervention in order to improve the following deficits and impairments:  Pain, Impaired UE functional use, Increased muscle spasms, Decreased range of motion, Decreased strength  Visit Diagnosis: Acute pain of right shoulder  Muscle weakness (generalized)  Stiffness of right shoulder, not elsewhere classified  Other muscle spasm     Problem List Patient Active Problem List   Diagnosis Date Noted  . S/P TKR (total knee replacement), bilateral   . Gastroesophageal reflux disease   . Post-operative pain   . Elevated blood pressure reading   . Bilateral primary osteoarthritis of knee 07/03/2017  . Lateral epicondylitis of right elbow 04/02/2013   04/04/2013, PT 03/27/19 3:35 PM Phone: [phone] Fax: [phone] 03/27/2019, 3:34 PM  [facility] [address] [place], [place], [place] Phone: [phone]   Fax:  [phone]  Name: Samantha Curtis MRN: [name] Date of Birth: 1966/06/02

## 2019-04-01 ENCOUNTER — Other Ambulatory Visit: Payer: Self-pay

## 2019-04-01 ENCOUNTER — Ambulatory Visit: Payer: PRIVATE HEALTH INSURANCE | Admitting: Physical Therapy

## 2019-04-01 DIAGNOSIS — M25511 Pain in right shoulder: Secondary | ICD-10-CM | POA: Diagnosis not present

## 2019-04-01 DIAGNOSIS — M25611 Stiffness of right shoulder, not elsewhere classified: Secondary | ICD-10-CM

## 2019-04-01 DIAGNOSIS — M62838 Other muscle spasm: Secondary | ICD-10-CM

## 2019-04-01 DIAGNOSIS — M6281 Muscle weakness (generalized): Secondary | ICD-10-CM

## 2019-04-01 NOTE — Therapy (Signed)
[facility] [address], [place], [place], [address] Phone: [phone]   Fax:  [phone]  Physical Therapy Treatment  Patient Details  Name: Samantha Curtis MRN: [id number] Date of Birth: 03/11/1966 Referring Provider (PT): Melrose Nakayama, MD   Encounter Date: 04/01/2019  PT End of Session - 04/01/19 2026    Visit Number  18    Number of Visits  25    Date for PT Re-Evaluation  05/13/19    Authorization Type  W/C 25 visits approved    Authorization - Visit Number  18    Authorization - Number of Visits  25    PT Start Time  8127    PT Stop Time  1515    PT Time Calculation (min)  40 min    Activity Tolerance  Patient limited by pain       Past Medical History:  Diagnosis Date  . Arthritis    "knees" (07/04/2017)  . Dysrhythmia    PALPITATIONS  . GERD (gastroesophageal reflux disease)   . High cholesterol   . Leg pain     Past Surgical History:  Procedure Laterality Date  . ENDOVENOUS ABLATION SAPHENOUS VEIN W/ LASER Left 2005   [facility]  . JOINT REPLACEMENT    . KNEE ARTHROSCOPY Right   . SHOULDER ARTHROSCOPY Right    "spurs"  . TOTAL KNEE ARTHROPLASTY Bilateral 07/03/2017   Procedure: TOTAL KNEE BILATERAL;  Surgeon: Melrose Nakayama, MD;  Location: [place];  Service: Orthopedics;  Laterality: Bilateral;  . TUBAL LIGATION    . VAGINAL HYSTERECTOMY      There were no vitals filed for this visit.  Subjective Assessment - 04/01/19 1436    Subjective  Usual busy day at work.  The shoulder is kind of there.  Not great.  [place].  Yesterday was 8/10.    Limitations  House hold activities;Lifting    Currently in Pain?  Yes    Pain Score  5     Pain Location  Shoulder    Pain Orientation  Right         [facility] PT Assessment - 04/01/19 0001      AROM   Right Shoulder Flexion  140 Degrees    Right Shoulder ABduction  115 Degrees    Right Shoulder Internal Rotation  --   L1   Right Shoulder  External Rotation  65 Degrees                   [facility] Adult PT Treatment/Exercise - 04/01/19 0001      Shoulder Exercises: Standing   Other Standing Exercises  place and holds right shoulder internal rotation 3x 20 sec holds       Shoulder Exercises: ROM/Strengthening   UBE (Upper Arm Bike)  3 min forward/backward     Proximal Shoulder Strengthening, Seated  3# pronation and supination, 3# 10x wrist flexion, 10# wrist extension 10x each     Prot/Ret//Elev/Dep  WB on right on wall with left UE clocks 10x     Sustained Retraction with Theraband  holding green band in isometrically neutral with backwards and forwards walking 10x     Other ROM/Strengthening Exercises  3# biceps 2x10 right/left     Other ROM/Strengthening Exercises  triceps right/left green band 2x 10      Ultrasound   Ultrasound Location  right subacromial region and right upper trap     Ultrasound Parameters  1.2 w/cm2 100% 5  min to each area (10 min total)    Ultrasound Goals  Pain               PT Short Term Goals - 02/14/19 1159      PT SHORT TERM GOAL #1   Title  independent with initial HEP    Status  Achieved      PT SHORT TERM GOAL #2   Title  AROM Rt shoulder flexion 120    Baseline  128    Status  Achieved        PT Long Term Goals - 03/25/19 1448      PT LONG TERM GOAL #2   Title  increased AROM to 160 deg Rt shoulder flexion without increased pain    Baseline  125    Status  On-going            Plan - 04/01/19 2026    Clinical Impression Statement  The patient continues to have high shoulder irritability therefore modified treatment session to include contralateral limb training for a crossover effect and total arm strengthening proximally and distally.  She continues to have grimacing and frequently rubs her shoulder during session.  She reports temporary relief with [place].  Improved shoulder flexion ROM however abduction, internal and external rotation are quite limited.     PT Frequency  2x / week    PT Treatment/Interventions  ADLs/Self Care Home Management;Biofeedback;Electrical Stimulation;Iontophoresis '4mg'$ /ml Dexamethasone;Moist Heat;Ultrasound;Therapeutic activities;Therapeutic exercise;Neuromuscular re-education;Manual techniques;Passive range of motion;Dry needling;Taping;Patient/family education    PT Next Visit Plan  try contralateral eccentric and isometric strengthening for crossover effect;  scapular and thoracic techniques rythmic stab;  [place];  right isometrics holds while moving body    PT Home Exercise Plan  Access Code: [facility]       Patient will benefit from skilled therapeutic intervention in order to improve the following deficits and impairments:  Pain, Impaired UE functional use, Increased muscle spasms, Decreased range of motion, Decreased strength  Visit Diagnosis: Acute pain of right shoulder  Muscle weakness (generalized)  Stiffness of right shoulder, not elsewhere classified  Other muscle spasm     Problem List Patient Active Problem List   Diagnosis Date Noted  . S/P TKR (total knee replacement), bilateral   . Gastroesophageal reflux disease   . Post-operative pain   . Elevated blood pressure reading   . Bilateral primary osteoarthritis of knee 07/03/2017  . Lateral epicondylitis of right elbow 04/02/2013   Ruben Im, PT 04/01/19 8:35 PM Phone: [phone] Fax: [phone] Alvera Singh 04/01/2019, 8:35 PM  [facility] [address] [place], [place], [address] Phone: [phone]   Fax:  [phone]  Name: Samantha Curtis MRN: [id number] Date of Birth: 01/19/1966

## 2019-04-03 ENCOUNTER — Ambulatory Visit: Payer: PRIVATE HEALTH INSURANCE | Admitting: Physical Therapy

## 2019-04-03 ENCOUNTER — Other Ambulatory Visit: Payer: Self-pay

## 2019-04-03 DIAGNOSIS — M62838 Other muscle spasm: Secondary | ICD-10-CM

## 2019-04-03 DIAGNOSIS — M25511 Pain in right shoulder: Secondary | ICD-10-CM

## 2019-04-03 DIAGNOSIS — M6281 Muscle weakness (generalized): Secondary | ICD-10-CM

## 2019-04-03 DIAGNOSIS — M25611 Stiffness of right shoulder, not elsewhere classified: Secondary | ICD-10-CM

## 2019-04-03 NOTE — Therapy (Signed)
[facility] [address], [address] [place], [place], [address] Phone: [phone]   Fax:  [phone]  Physical Therapy Treatment  Patient Details  Name: Samantha Curtis MRN: [id number] Date of Birth: [birth year] Referring Provider (PT): Marcene Corning, MD   Encounter Date: 04/03/2019  PT End of Session - 04/03/19 1708    Visit Number  19    Number of Visits  25    Authorization Type  W/C 25 visits approved    Authorization - Visit Number  19    Authorization - Number of Visits  25    PT Start Time  1615    PT Stop Time  1700    PT Time Calculation (min)  45 min    Activity Tolerance  Patient limited by pain       Past Medical History:  Diagnosis Date  . Arthritis    "knees" (07/04/2017)  . Dysrhythmia    PALPITATIONS  . GERD (gastroesophageal reflux disease)   . High cholesterol   . Leg pain     Past Surgical History:  Procedure Laterality Date  . ENDOVENOUS ABLATION SAPHENOUS VEIN W/ LASER Left 2005   [facility]  . JOINT REPLACEMENT    . KNEE ARTHROSCOPY Right   . SHOULDER ARTHROSCOPY Right    "spurs"  . TOTAL KNEE ARTHROPLASTY Bilateral 07/03/2017   Procedure: TOTAL KNEE BILATERAL;  Surgeon: Marcene Corning, MD;  [facility];  Service: Orthopedics;  Laterality: Bilateral;  . TUBAL LIGATION    . VAGINAL HYSTERECTOMY      There were no vitals filed for this visit.  Subjective Assessment - 04/03/19 1619    Subjective  Busy day at work.  Today is a regular day with my shoulder.  I'm never painfree.  My knees always hurt too.  My shoulder wakes me up at night.  It hurts to turn the steering.    Patient Stated Goals  be able to do what I need to do without pain and make sure there is no tear    Currently in Pain?  Yes    Pain Score  5     Pain Location  Shoulder    Pain Orientation  Right    Pain Type  Chronic pain                       [facility] Adult PT Treatment/Exercise - 04/03/19  0001      Therapeutic Activites    Therapeutic Activities  Lifting    Lifting  10# weight 2 hand dead lifts from knee level 2x 10 ;  30# bar 2 handed press 10x       Shoulder Exercises: Supine   Protraction  Right;10 reps    Flexion Limitations  small arc 12/6    ABduction Limitations  small arc 9:00/3 20     Other Supine Exercises  thoracic extension with foam roll     Other Supine Exercises  lying on blue foam roll with palms up for gentle pectoral stretch and thoracic extension with foam roll horizontally 10x       Shoulder Exercises: Sidelying   ABduction Limitations  mini open books 20x     Other Sidelying Exercises  scapular retraction with manual resistance 10x       Shoulder Exercises: Standing   Row Limitations  triceps green band 20x    Diagonals Limitations  10# pendulum swings 20x     Other Standing  Exercises  holding green band isometrically in internal and external rotation while side stepping 10x each     Other Standing Exercises  holding green band isometrically with forward and backward walking 10x each way       Ultrasound   Ultrasound Location  right suboacromial and upper trap regions    Ultrasound Parameters  1.2 W/cm2 100% 1 Mhz     Ultrasound Goals  Pain               PT Short Term Goals - 02/14/19 1159      PT SHORT TERM GOAL #1   Title  independent with initial HEP    Status  Achieved      PT SHORT TERM GOAL #2   Title  AROM Rt shoulder flexion 120    Baseline  128    Status  Achieved        PT Long Term Goals - 03/25/19 1448      PT LONG TERM GOAL #2   Title  increased AROM to 160 deg Rt shoulder flexion without increased pain    Baseline  125    Status  On-going            Plan - 04/03/19 1708    Clinical Impression Statement  The patient continues to rub her shoulder frequently throughout session.  She reports all exercises are painful but she feels like she can push through so she can get stronger.  She worries that she  will get to the point that she can't hold her 63 week old grandbaby so she would like to get as strong as possible.  Central sensitization may account for multiple location pain.  Verbal cues to avoid compensatory shoulder shrug/hike and cues for proper hip hinge/avoidance of thoracic rounding.    PT Frequency  2x / week    PT Duration  8 weeks    PT Treatment/Interventions  ADLs/Self Care Home Management;Biofeedback;Electrical Stimulation;Iontophoresis 4mg /ml Dexamethasone;Moist Heat;Ultrasound;Therapeutic activities;Therapeutic exercise;Neuromuscular re-education;Manual techniques;Passive range of motion;Dry needling;Taping;Patient/family education    PT Next Visit Plan  total arm  strengthening;  scapular and thoracic techniques rythmic stab;  [place];  dead lifting with increased weight;  weight bar press increase reps    PT Home Exercise Plan  Access Code: [email or web address]       Patient will benefit from skilled therapeutic intervention in order to improve the following deficits and impairments:  Pain, Impaired UE functional use, Increased muscle spasms, Decreased range of motion, Decreased strength  Visit Diagnosis: Acute pain of right shoulder  Muscle weakness (generalized)  Stiffness of right shoulder, not elsewhere classified  Other muscle spasm     Problem List Patient Active Problem List   Diagnosis Date Noted  . S/P TKR (total knee replacement), bilateral   . Gastroesophageal reflux disease   . Post-operative pain   . Elevated blood pressure reading   . Bilateral primary osteoarthritis of knee 07/03/2017  . Lateral epicondylitis of right elbow 04/02/2013   Ruben Im, PT 04/03/19 5:14 PM Phone: [phone] Fax: [phone] Alvera Singh 04/03/2019, 5:14 PM  [facility] [address] [place], [place], [address] Phone: [phone]   Fax:  [phone]  Name: Samantha Curtis MRN: [id number] Date of Birth:  09-20-1965

## 2019-04-07 ENCOUNTER — Other Ambulatory Visit: Payer: Self-pay

## 2019-04-07 ENCOUNTER — Encounter: Payer: Self-pay | Admitting: Physical Therapy

## 2019-04-07 ENCOUNTER — Ambulatory Visit: Payer: PRIVATE HEALTH INSURANCE | Admitting: Physical Therapy

## 2019-04-07 ENCOUNTER — Other Ambulatory Visit: Payer: Self-pay | Admitting: Obstetrics and Gynecology

## 2019-04-07 DIAGNOSIS — M62838 Other muscle spasm: Secondary | ICD-10-CM

## 2019-04-07 DIAGNOSIS — M6281 Muscle weakness (generalized): Secondary | ICD-10-CM

## 2019-04-07 DIAGNOSIS — M25511 Pain in right shoulder: Secondary | ICD-10-CM | POA: Diagnosis not present

## 2019-04-07 DIAGNOSIS — Z1231 Encounter for screening mammogram for malignant neoplasm of breast: Secondary | ICD-10-CM

## 2019-04-07 DIAGNOSIS — M25611 Stiffness of right shoulder, not elsewhere classified: Secondary | ICD-10-CM

## 2019-04-07 NOTE — Therapy (Signed)
[facility] [address], [address] [place], [place], [address] Phone: [phone]   Fax:  [phone]  Physical Therapy Treatment  Patient Details  Name: Samantha Curtis MRN: [id number] Date of Birth: 03-25-1966 Referring Provider (PT): Marcene Corning, MD   Encounter Date: 04/07/2019  PT End of Session - 04/07/19 1402    Visit Number  20    Number of Visits  25    Date for PT Re-Evaluation  05/13/19    Authorization Type  W/C 25 visits approved    Authorization - Visit Number  20    Authorization - Number of Visits  25    PT Start Time  1400    PT Stop Time  1450    PT Time Calculation (min)  50 min    Activity Tolerance  Patient tolerated treatment well    Behavior During Therapy  [facility] for tasks assessed/performed       Past Medical History:  Diagnosis Date  . Arthritis    "knees" (07/04/2017)  . Dysrhythmia    PALPITATIONS  . GERD (gastroesophageal reflux disease)   . High cholesterol   . Leg pain     Past Surgical History:  Procedure Laterality Date  . ENDOVENOUS ABLATION SAPHENOUS VEIN W/ LASER Left 2005   [facility]  . JOINT REPLACEMENT    . KNEE ARTHROSCOPY Right   . SHOULDER ARTHROSCOPY Right    "spurs"  . TOTAL KNEE ARTHROPLASTY Bilateral 07/03/2017   Procedure: TOTAL KNEE BILATERAL;  Surgeon: Marcene Corning, MD;  Location: [facility];  Service: Orthopedics;  Laterality: Bilateral;  . TUBAL LIGATION    . VAGINAL HYSTERECTOMY      There were no vitals filed for this visit.  Subjective Assessment - 04/07/19 1404    Subjective  Having a good day. Work was not too bad today.    Limitations  House hold activities;Lifting    Currently in Pain?  Yes    Pain Score  4     Pain Location  Shoulder    Pain Orientation  Right    Pain Descriptors / Indicators  Dull;Aching    Aggravating Factors   Work duties    Pain Relieving Factors  Maybe the exercises?    Multiple Pain Sites  No                        [facility] Adult PT Treatment/Exercise - 04/07/19 0001      Shoulder Exercises: Supine   Other Supine Exercises  Stabilize scapula towards mat and small GH circles: too painful and had to stop      Shoulder Exercises: Prone   Extension  Strengthening;Right;20 reps;Weights    Extension Weight (lbs)  1    Horizontal ABduction 1  AROM;Strengthening;Right;20 reps    Horizontal ABduction 2  Strengthening;Right;10 reps    Horizontal ABduction 2 Limitations  Very limited ROM and pt has to bend her elbow just to get a little lift      Shoulder Exercises: ROM/Strengthening   UBE (Upper Arm Bike)  L2 3x3 with concurrent discussion of status and pain      Ultrasound   Ultrasound Location  RT anteriolateral shld    Ultrasound Parameters  1.2W/cm2 100% 8 min    Ultrasound Goals  Pain               PT Short Term Goals - 02/14/19 1159      PT SHORT  TERM GOAL #1   Title  independent with initial HEP    Status  Achieved      PT SHORT TERM GOAL #2   Title  AROM Rt shoulder flexion 120    Baseline  128    Status  Achieved        PT Long Term Goals - 03/25/19 1448      PT LONG TERM GOAL #2   Title  increased AROM to 160 deg Rt shoulder flexion without increased pain    Baseline  125    Status  On-going            Plan - 04/07/19 1403    Clinical Impression Statement  Pt presents today with essentially the same complaints: simple movements which she defines as "peeling vegtables, holding her grandchild, reaching for a light object" continue to be a source of pain for her. She had significant pain anteriolateral Rt shoulder laying supine, holding arm at 90 degrees flexion just trying to gently press her scapula into the mat. Pt cannnot lay prone and lift her arm horizontally or diagonally very well and with pain ( mostly with diagonal lift/Y) and flexes her elbow to compensate. Ultrasound helps to ease her pain after attempting the exercises.     Personal Factors and Comorbidities  Profession    Examination-Activity Limitations  Reach Overhead;Lift;Dressing;Caring for Others;Carry    Stability/Clinical Decision Making  Stable/Uncomplicated    PT Frequency  2x / week    PT Duration  8 weeks    PT Treatment/Interventions  ADLs/Self Care Home Management;Biofeedback;Electrical Stimulation;Iontophoresis 4mg /ml Dexamethasone;Moist Heat;Ultrasound;Therapeutic activities;Therapeutic exercise;Neuromuscular re-education;Manual techniques;Passive range of motion;Dry needling;Taping;Patient/family education    PT Next Visit Plan  Pt sees MD some day this week, might be Thursday.    PT Home Exercise Plan  Access Code: [email or web address]    Consulted and Agree with Plan of Care  Patient       Patient will benefit from skilled therapeutic intervention in order to improve the following deficits and impairments:  Pain, Impaired UE functional use, Increased muscle spasms, Decreased range of motion, Decreased strength  Visit Diagnosis: Acute pain of right shoulder  Muscle weakness (generalized)  Stiffness of right shoulder, not elsewhere classified  Other muscle spasm     Problem List Patient Active Problem List   Diagnosis Date Noted  . S/P TKR (total knee replacement), bilateral   . Gastroesophageal reflux disease   . Post-operative pain   . Elevated blood pressure reading   . Bilateral primary osteoarthritis of knee 07/03/2017  . Lateral epicondylitis of right elbow 04/02/2013    Deshane Cotroneo, PTA 04/07/2019, 2:45 PM  [facility] [address] [place], [place], [address] Phone: [phone]   Fax:  [phone]  Name: Samantha Curtis MRN: [id number] Date of Birth: 12-18-1965

## 2019-04-08 ENCOUNTER — Encounter: Payer: No Typology Code available for payment source | Admitting: Physical Therapy

## 2019-04-10 ENCOUNTER — Other Ambulatory Visit: Payer: Self-pay

## 2019-04-10 ENCOUNTER — Ambulatory Visit: Payer: PRIVATE HEALTH INSURANCE | Admitting: Physical Therapy

## 2019-04-10 DIAGNOSIS — M25611 Stiffness of right shoulder, not elsewhere classified: Secondary | ICD-10-CM

## 2019-04-10 DIAGNOSIS — M62838 Other muscle spasm: Secondary | ICD-10-CM

## 2019-04-10 DIAGNOSIS — M25511 Pain in right shoulder: Secondary | ICD-10-CM | POA: Diagnosis not present

## 2019-04-10 DIAGNOSIS — M6281 Muscle weakness (generalized): Secondary | ICD-10-CM

## 2019-04-10 NOTE — Therapy (Signed)
[facility] [address] [place], [place], [address] Phone: [phone]   Fax:  [phone]  Physical Therapy Treatment  Patient Details  Name: Samantha Curtis MRN: [id number] Date of Birth: 11-04-1965 Referring Provider (PT): Melrose Nakayama, MD   Encounter Date: 04/10/2019  PT End of Session - 04/10/19 1656    Visit Number  21    Number of Visits  25    Date for PT Re-Evaluation  05/13/19    Authorization Type  W/C 25 visits approved    Authorization - Visit Number  21    Authorization - Number of Visits  25    PT Start Time  1660    PT Stop Time  1654    PT Time Calculation (min)  44 min    Activity Tolerance  Patient limited by pain       Past Medical History:  Diagnosis Date  . Arthritis    "knees" (07/04/2017)  . Dysrhythmia    PALPITATIONS  . GERD (gastroesophageal reflux disease)   . High cholesterol   . Leg pain     Past Surgical History:  Procedure Laterality Date  . ENDOVENOUS ABLATION SAPHENOUS VEIN W/ LASER Left 2005   [facility]  . JOINT REPLACEMENT    . KNEE ARTHROSCOPY Right   . SHOULDER ARTHROSCOPY Right    "spurs"  . TOTAL KNEE ARTHROPLASTY Bilateral 07/03/2017   Procedure: TOTAL KNEE BILATERAL;  Surgeon: Melrose Nakayama, MD;  Location: [facility];  Service: Orthopedics;  Laterality: Bilateral;  . TUBAL LIGATION    . VAGINAL HYSTERECTOMY      There were no vitals filed for this visit.  Subjective Assessment - 04/10/19 1619    Subjective  Today is a bad day.  I saw the doctor and I'm going to have athroscopic surgery in the beginning of January.  The doctor wants me to finish out my PT here in November to keep me moving as much as possible until then.    Pain Score  6     Pain Location  Shoulder    Pain Orientation  Right    Aggravating Factors   work duties                       [facility] Adult PT Treatment/Exercise - 04/10/19 0001      Shoulder Exercises:  Supine   Protraction  Right;10 reps   very painful    Other Supine Exercises  bicep curls 2# 20x       Shoulder Exercises: Prone   Other Prone Exercises  bent rows I, Y T 10x       Shoulder Exercises: Standing   Protraction  AROM;Right;10 reps    Other Standing Exercises  10# dead lift 10x     Other Standing Exercises  triceps extension 2# 10x       Shoulder Exercises: ROM/Strengthening   Wall Pushups  10 reps    Sustained Retraction with Theraband  isometric shoulder extension 10x 5 sec hold     Other ROM/Strengthening Exercises  resisted walking forward and back holding right arm isometrically by her side 10x 30#     Other ROM/Strengthening Exercises  20 reps ball on wall circles       Ultrasound   Ultrasound Location  right subacromial region  and upper trap     Ultrasound Parameters  1.2 w/cm2 1 Hz 8 min     Ultrasound Goals  Pain  PT Short Term Goals - 02/14/19 1159      PT SHORT TERM GOAL #1   Title  independent with initial HEP    Status  Achieved      PT SHORT TERM GOAL #2   Title  AROM Rt shoulder flexion 120    Baseline  128    Status  Achieved        PT Long Term Goals - 03/25/19 1448      PT LONG TERM GOAL #2   Title  increased AROM to 160 deg Rt shoulder flexion without increased pain    Baseline  125    Status  On-going            Plan - 04/10/19 1656    Clinical Impression Statement  The patient is very painful today following a busy day at work.  She saw the doctor and plans to move forward with arthroscopic shoulder surgery in early January.  She would like to continue with PT per MD recommendation a few more visits to gain as much shoulder mobility and strength as possible for a better post surgical outcome.  Treatment modified several times today secondary to subacromial region pain. Shoulder pain easily exacerbated and therefore low intensity ex's only.   She reports good pain relief with U/S.    PT Frequency  2x / week     PT Duration  8 weeks    PT Treatment/Interventions  ADLs/Self Care Home Management;Biofeedback;Electrical Stimulation;Iontophoresis 4mg /ml Dexamethasone;Moist Heat;Ultrasound;Therapeutic activities;Therapeutic exercise;Neuromuscular re-education;Manual techniques;Passive range of motion;Dry needling;Taping;Patient/family education    PT Next Visit Plan  Continue with gentle arm strength shoulder stabilization exs;  for pain control    PT Home Exercise Plan  Access Code: [email or web address]       Patient will benefit from skilled therapeutic intervention in order to improve the following deficits and impairments:  Pain, Impaired UE functional use, Increased muscle spasms, Decreased range of motion, Decreased strength  Visit Diagnosis: Acute pain of right shoulder  Muscle weakness (generalized)  Stiffness of right shoulder, not elsewhere classified  Other muscle spasm     Problem List Patient Active Problem List   Diagnosis Date Noted  . S/P TKR (total knee replacement), bilateral   . Gastroesophageal reflux disease   . Post-operative pain   . Elevated blood pressure reading   . Bilateral primary osteoarthritis of knee 07/03/2017  . Lateral epicondylitis of right elbow 04/02/2013   04/04/2013, PT 04/10/19 5:03 PM Phone: [phone] Fax: [phone] 04/10/2019, 04/12/2019 PM  [facility] [address], [address] [place], [place], [place] Phone: [phone]   Fax:  [phone]  Name: Samantha Curtis MRN: [name] Date of Birth: 06/01/1966

## 2019-04-15 ENCOUNTER — Other Ambulatory Visit: Payer: Self-pay

## 2019-04-15 ENCOUNTER — Encounter: Payer: Self-pay | Admitting: Physical Therapy

## 2019-04-15 ENCOUNTER — Ambulatory Visit: Payer: PRIVATE HEALTH INSURANCE | Admitting: Physical Therapy

## 2019-04-15 DIAGNOSIS — M62838 Other muscle spasm: Secondary | ICD-10-CM

## 2019-04-15 DIAGNOSIS — M25511 Pain in right shoulder: Secondary | ICD-10-CM | POA: Diagnosis not present

## 2019-04-15 DIAGNOSIS — M25611 Stiffness of right shoulder, not elsewhere classified: Secondary | ICD-10-CM

## 2019-04-15 DIAGNOSIS — M6281 Muscle weakness (generalized): Secondary | ICD-10-CM

## 2019-04-15 NOTE — Therapy (Signed)
[facility] [address] [place], [place], [address] Phone: [phone]   Fax:  [phone]  Physical Therapy Treatment  Patient Details  Name: LOANN CHAHAL MRN: [id number] Date of Birth: 1965/07/20 Referring Provider (PT): Melrose Nakayama, MD   Encounter Date: 04/15/2019  PT End of Session - 04/15/19 1402    Visit Number  22    Number of Visits  25    Date for PT Re-Evaluation  05/13/19    Authorization Type  W/C 25 visits approved    Authorization - Visit Number  51    Authorization - Number of Visits  25    Activity Tolerance  Patient limited by pain    Behavior During Therapy  [facility] for tasks assessed/performed       Past Medical History:  Diagnosis Date  . Arthritis    "knees" (07/04/2017)  . Dysrhythmia    PALPITATIONS  . GERD (gastroesophageal reflux disease)   . High cholesterol   . Leg pain     Past Surgical History:  Procedure Laterality Date  . ENDOVENOUS ABLATION SAPHENOUS VEIN W/ LASER Left 2005   [facility]  . JOINT REPLACEMENT    . KNEE ARTHROSCOPY Right   . SHOULDER ARTHROSCOPY Right    "spurs"  . TOTAL KNEE ARTHROPLASTY Bilateral 07/03/2017   Procedure: TOTAL KNEE BILATERAL;  Surgeon: Melrose Nakayama, MD;  Location: [facility];  Service: Orthopedics;  Laterality: Bilateral;  . TUBAL LIGATION    . VAGINAL HYSTERECTOMY      There were no vitals filed for this visit.  Subjective Assessment - 04/15/19 1407    Subjective  Today was insaine at work.  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  Pt states she has been having to take anti inflammatory 2x/ day lately.    Patient Stated Goals  be able to do what I need to do without pain and make sure there is no tear    Currently in Pain?  Yes    Pain Score  4     Pain Location  Shoulder    Pain Orientation  Right    Pain Descriptors / Indicators  Aching;Dull    Pain Type  Chronic pain    Pain  Onset  More than a month ago    Pain Frequency  Intermittent    Aggravating Factors   work    Pain Relieving Factors  TENS and [place], naproxem 2x/day or Tylenol sometimes    Multiple Pain Sites  No                       [facility] Adult PT Treatment/Exercise - 04/15/19 0001      Shoulder Exercises: Supine   Other Supine Exercises  supine ER with red band, isometric flexion PT pulling red band; press up 2lb - 10x each       Shoulder Exercises: Standing   Diagonals  Strengthening;Right;10 reps;Weights   15# power tower - D1 extension   Shoulder Elevation Limitations  foam roll behind back - thoracic ext and scap squeeze 10x    Other Standing Exercises  10# dead lift 15x     Other Standing Exercises  triceps extension 15# power tower 2x10      Ultrasound   Ultrasound Location  Rt subaccromial region    Ultrasound Parameters  1.2 w/cm; 30mHz; 100% then reduced to 20% after 4 min due to  pain (pt also sat up which might have releived pain - 8 min total time    Ultrasound Goals  Pain               PT Short Term Goals - 02/14/19 1159      PT SHORT TERM GOAL #1   Title  independent with initial HEP    Status  Achieved      PT SHORT TERM GOAL #2   Title  AROM Rt shoulder flexion 120    Baseline  128    Status  Achieved        PT Long Term Goals - 03/25/19 1448      PT LONG TERM GOAL #2   Title  increased AROM to 160 deg Rt shoulder flexion without increased pain    Baseline  125    Status  On-going            Plan - 04/15/19 1544    Clinical Impression Statement  Pt tolerated treatment well.  She needed to be monitored for pain throughout and exercises were adjusted or discontinued with any increased pain.  Pt reports [place] helped at the end of today's treatment.  She seemed to tolerate more strengthening today.  Pt's exercise tolerance seems to be directly correlated to how much she has to do at work.  Pt will continue to benefit from skilled PT to work on  strength, ROM, and pain mangagement.    PT Treatment/Interventions  ADLs/Self Care Home Management;Biofeedback;Electrical Stimulation;Iontophoresis 4mg /ml Dexamethasone;Moist Heat;Ultrasound;Therapeutic activities;Therapeutic exercise;Neuromuscular re-education;Manual techniques;Passive range of motion;Dry needling;Taping;Patient/family education    PT Next Visit Plan  Continue with gentle arm strength shoulder stabilization exs;  for pain control    PT Home Exercise Plan  Access Code: [email or web address]    Consulted and Agree with Plan of Care  Patient       Patient will benefit from skilled therapeutic intervention in order to improve the following deficits and impairments:  Pain, Impaired UE functional use, Increased muscle spasms, Decreased range of motion, Decreased strength  Visit Diagnosis: Acute pain of right shoulder  Muscle weakness (generalized)  Stiffness of right shoulder, not elsewhere classified  Other muscle spasm     Problem List Patient Active Problem List   Diagnosis Date Noted  . S/P TKR (total knee replacement), bilateral   . Gastroesophageal reflux disease   . Post-operative pain   . Elevated blood pressure reading   . Bilateral primary osteoarthritis of knee 07/03/2017  . Lateral epicondylitis of right elbow 04/02/2013    04/04/2013, PT 04/15/2019, 3:46 PM  [facility] [address] [place], [place], [place] Phone: [phone]   Fax:  [phone]  Name: TERRIAH REGGIO MRN: [name] Date of Birth: [birth year]

## 2019-04-17 ENCOUNTER — Ambulatory Visit: Payer: PRIVATE HEALTH INSURANCE | Admitting: Physical Therapy

## 2019-04-17 ENCOUNTER — Other Ambulatory Visit: Payer: Self-pay

## 2019-04-17 ENCOUNTER — Encounter: Payer: Self-pay | Admitting: Physical Therapy

## 2019-04-17 DIAGNOSIS — M25511 Pain in right shoulder: Secondary | ICD-10-CM

## 2019-04-17 DIAGNOSIS — M6281 Muscle weakness (generalized): Secondary | ICD-10-CM

## 2019-04-17 DIAGNOSIS — M25611 Stiffness of right shoulder, not elsewhere classified: Secondary | ICD-10-CM

## 2019-04-17 DIAGNOSIS — M62838 Other muscle spasm: Secondary | ICD-10-CM

## 2019-04-17 NOTE — Therapy (Signed)
[facility] [address] [place], [place], [address] Phone: [phone]   Fax:  [phone]  Physical Therapy Treatment  Patient Details  Name: Samantha Curtis MRN: [id number] Date of Birth: 07-May-1966 Referring Provider (PT): Melrose Nakayama, MD   Encounter Date: 04/17/2019  PT End of Session - 04/17/19 1403    Visit Number  23    Number of Visits  25    Date for PT Re-Evaluation  05/13/19    Authorization Type  W/C 25 visits approved    Authorization - Visit Number  23    Authorization - Number of Visits  25    PT Start Time  1400    PT Stop Time  1438    PT Time Calculation (min)  38 min    Activity Tolerance  Patient limited by pain    Behavior During Therapy  [facility] for tasks assessed/performed       Past Medical History:  Diagnosis Date  . Arthritis    "knees" (07/04/2017)  . Dysrhythmia    PALPITATIONS  . GERD (gastroesophageal reflux disease)   . High cholesterol   . Leg pain     Past Surgical History:  Procedure Laterality Date  . ENDOVENOUS ABLATION SAPHENOUS VEIN W/ LASER Left 2005   [facility]  . JOINT REPLACEMENT    . KNEE ARTHROSCOPY Right   . SHOULDER ARTHROSCOPY Right    "spurs"  . TOTAL KNEE ARTHROPLASTY Bilateral 07/03/2017   Procedure: TOTAL KNEE BILATERAL;  Surgeon: Melrose Nakayama, MD;  Location: [place];  Service: Orthopedics;  Laterality: Bilateral;  . TUBAL LIGATION    . VAGINAL HYSTERECTOMY      There were no vitals filed for this visit.  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  Yes    Pain Score  5     Pain Location  Shoulder    Pain Orientation  Right    Multiple Pain Sites  No                       [facility] Adult PT Treatment/Exercise - 04/17/19 0001      Shoulder Exercises: Prone   Other Prone Exercises  bent rows I, Y T 10x       Shoulder Exercises: Standing   Diagonals   Strengthening;Right;10 reps;Weights   15# power tower - D1 extension   Other Standing Exercises  triceps extension 15# power tower 2x10      Shoulder Exercises: ROM/Strengthening   [place]  shoulder flexion standing - 20x warm up and PT getting status update    Wall Pushups  10 reps    Other ROM/Strengthening Exercises  resisted walking isometric row position - 30# x 10 reps    Other ROM/Strengthening Exercises  20 reps ball on wall circles       Shoulder Exercises: Isometric Strengthening   Flexion Limitations  20x stepping to the side holding yellow band elbow extension    Extension Limitations  20x stepping to the side holding yellow band    External Rotation Limitations  20x stepping to the side holding yellow band    Internal Rotation Limitations  20x stepping to the side holding yellow band      Ultrasound   Ultrasound Location  Rt subaccromial region    Ultrasound Parameters  1.2 w/cm 100% duty; 1MHz, x  8 min    Ultrasound Goals  Pain               PT Short Term Goals - 02/14/19 1159      PT SHORT TERM GOAL #1   Title  independent with initial HEP    Status  Achieved      PT SHORT TERM GOAL #2   Title  AROM Rt shoulder flexion 120    Baseline  128    Status  Achieved        PT Long Term Goals - 03/25/19 1448      PT LONG TERM GOAL #2   Title  increased AROM to 160 deg Rt shoulder flexion without increased pain    Baseline  125    Status  On-going            Plan - 04/17/19 1439    Clinical Impression Statement  Pt tolerated exercises well today.  She needed some cues to adjust shoulder position during isometric extension and rows in order to minimize pain.  She responded well to [place] at the end of today's treatment and reports feeling better after the [place].  Continue with skilled PT according to POC.    PT Treatment/Interventions  ADLs/Self Care Home Management;Biofeedback;Electrical Stimulation;Iontophoresis 4mg /ml Dexamethasone;Moist  Heat;Ultrasound;Therapeutic activities;Therapeutic exercise;Neuromuscular re-education;Manual techniques;Passive range of motion;Dry needling;Taping;Patient/family education    PT Next Visit Plan  Continue with gentle arm strength shoulder stabilization exs;  for pain control    PT Home Exercise Plan  Access Code: [email or web address]    Consulted and Agree with Plan of Care  Patient       Patient will benefit from skilled therapeutic intervention in order to improve the following deficits and impairments:  Pain, Impaired UE functional use, Increased muscle spasms, Decreased range of motion, Decreased strength  Visit Diagnosis: Acute pain of right shoulder  Muscle weakness (generalized)  Stiffness of right shoulder, not elsewhere classified  Other muscle spasm     Problem List Patient Active Problem List   Diagnosis Date Noted  . S/P TKR (total knee replacement), bilateral   . Gastroesophageal reflux disease   . Post-operative pain   . Elevated blood pressure reading   . Bilateral primary osteoarthritis of knee 07/03/2017  . Lateral epicondylitis of right elbow 04/02/2013    04/04/2013, PT 04/17/2019, 2:43 PM   [facility] [address], [address] [place], [place], [place] Phone: [phone]   Fax:  [phone]  Name: Samantha Curtis MRN: [name] Date of Birth: August 30, 1965

## 2019-04-18 ENCOUNTER — Ambulatory Visit (INDEPENDENT_AMBULATORY_CARE_PROVIDER_SITE_OTHER): Payer: No Typology Code available for payment source

## 2019-04-18 ENCOUNTER — Other Ambulatory Visit: Payer: Self-pay

## 2019-04-18 ENCOUNTER — Encounter: Payer: Self-pay | Admitting: Vascular Surgery

## 2019-04-18 DIAGNOSIS — I8393 Asymptomatic varicose veins of bilateral lower extremities: Secondary | ICD-10-CM

## 2019-04-18 NOTE — Progress Notes (Signed)
X=40mg  Asclera administered with a 27g butterfly.  Patient received a total of 4cc.   Photos: Yes.    Compression stockings applied: Yes.     Procedure explained to patient with time out. Dr. Donzetta Matters in to assess larger vein on top of right foot and agreed it was appropriate for injection. Treated all areas of concern. Easy access. Tolerated well. Anticipate good results. Follow PRN.

## 2019-04-21 ENCOUNTER — Other Ambulatory Visit: Payer: Self-pay

## 2019-04-21 ENCOUNTER — Ambulatory Visit: Payer: PRIVATE HEALTH INSURANCE | Attending: Orthopaedic Surgery | Admitting: Physical Therapy

## 2019-04-21 DIAGNOSIS — M62838 Other muscle spasm: Secondary | ICD-10-CM | POA: Diagnosis present

## 2019-04-21 DIAGNOSIS — M6281 Muscle weakness (generalized): Secondary | ICD-10-CM | POA: Diagnosis present

## 2019-04-21 DIAGNOSIS — M25611 Stiffness of right shoulder, not elsewhere classified: Secondary | ICD-10-CM | POA: Insufficient documentation

## 2019-04-21 DIAGNOSIS — M25511 Pain in right shoulder: Secondary | ICD-10-CM | POA: Insufficient documentation

## 2019-04-21 MED FILL — NAPROXEN 500 MG TABLET: 500 | 30 days supply | Qty: 60 | Fill #0

## 2019-04-21 NOTE — Therapy (Signed)
[facility] [address] [place], [place], [address] Phone: [phone]   Fax:  [phone]  Physical Therapy Treatment  Patient Details  Name: Samantha Curtis MRN: [id number] Date of Birth: Jul 12, 1965 Referring Provider (PT): Melrose Nakayama, MD   Encounter Date: 04/21/2019  PT End of Session - 04/21/19 1411    Visit Number  24    Number of Visits  25    Authorization Type  W/C 25 visits approved    Authorization - Visit Number  24    Authorization - Number of Visits  25    PT Start Time  1400    PT Stop Time  1440    PT Time Calculation (min)  40 min    Activity Tolerance  Patient limited by pain       Past Medical History:  Diagnosis Date  . Arthritis    "knees" (07/04/2017)  . Dysrhythmia    PALPITATIONS  . GERD (gastroesophageal reflux disease)   . High cholesterol   . Leg pain     Past Surgical History:  Procedure Laterality Date  . ENDOVENOUS ABLATION SAPHENOUS VEIN W/ LASER Left 2005   [facility]  . JOINT REPLACEMENT    . KNEE ARTHROSCOPY Right   . SHOULDER ARTHROSCOPY Right    "spurs"  . TOTAL KNEE ARTHROPLASTY Bilateral 07/03/2017   Procedure: TOTAL KNEE BILATERAL;  Surgeon: Melrose Nakayama, MD;  Location: [place];  Service: Orthopedics;  Laterality: Bilateral;  . TUBAL LIGATION    . VAGINAL HYSTERECTOMY      There were no vitals filed for this visit.  Subjective Assessment - 04/21/19 1404    Currently in Pain?  Yes    Pain Score  3     Pain Location  Shoulder    Pain Orientation  Right    Pain Descriptors / Indicators  Aching;Dull    Pain Onset  More than a month ago    Pain Frequency  Constant    Aggravating Factors   work and moving    Multiple Pain Sites  No                       [facility] Adult PT Treatment/Exercise - 04/21/19 0001      Shoulder Exercises: Prone   Extension  Strengthening;Right;20 reps;Weights    Extension Weight (lbs)  1    Horizontal  ABduction 1  AROM;Strengthening;Right;20 reps    Horizontal ABduction 2  Strengthening;Right;10 reps    Other Prone Exercises  bent rows I, Y  10x       Shoulder Exercises: Standing   External Rotation  Strengthening;10 reps;Theraband    Theraband Level (Shoulder External Rotation)  Level 1 (Yellow)    Internal Rotation  Strengthening;Right;Theraband;10 reps    Theraband Level (Shoulder Internal Rotation)  Level 1 (Yellow)    Diagonals  --   attempt but increased pain today   Other Standing Exercises  triceps extension 15# power tower 2x10      Shoulder Exercises: ROM/Strengthening   Other ROM/Strengthening Exercises  resisted walking isometric row position - 30# x 10 reps    Other ROM/Strengthening Exercises  resisted walking fwd/back with UE at sides - yellow      Ultrasound   Ultrasound Location  R subacromial region    Ultrasound Parameters  1.2, 100% x 5 min    Ultrasound Goals  Pain      Manual Therapy   Soft tissue mobilization  Addaday instrument assisted to right upper trap, infraspinatus, rhomboids x  min               PT Short Term Goals - 02/14/19 1159      PT SHORT TERM GOAL #1   Title  independent with initial HEP    Status  Achieved      PT SHORT TERM GOAL #2   Title  AROM Rt shoulder flexion 120    Baseline  128    Status  Achieved        PT Long Term Goals - 03/25/19 1448      PT LONG TERM GOAL #2   Title  increased AROM to 160 deg Rt shoulder flexion without increased pain    Baseline  125    Status  On-going            Plan - 04/21/19 1445    Clinical Impression Statement  Pt did well with exercises today . She has mild pain during exercises but pain stops when she is at rest.  Pt had a little more muscle spasms of upper trap and rhomboids but was relaxed after addaday treatment.  Pt will benefit from skilled PT to work on transitioning home with HEP so she can maintain her progress and prepare for upcoming surgery.    PT  Treatment/Interventions  ADLs/Self Care Home Management;Biofeedback;Electrical Stimulation;Iontophoresis 4mg /ml Dexamethasone;Moist Heat;Ultrasound;Therapeutic activities;Therapeutic exercise;Neuromuscular re-education;Manual techniques;Passive range of motion;Dry needling;Taping;Patient/family education    PT Next Visit Plan  re-assess goals, discharge with HEP next, Continue with gentle arm strength shoulder stabilization exs;  for pain control    PT Home Exercise Plan  Access Code: [email or web address]    Consulted and Agree with Plan of Care  Patient       Patient will benefit from skilled therapeutic intervention in order to improve the following deficits and impairments:  Pain, Impaired UE functional use, Increased muscle spasms, Decreased range of motion, Decreased strength  Visit Diagnosis: Acute pain of right shoulder  Muscle weakness (generalized)  Stiffness of right shoulder, not elsewhere classified  Other muscle spasm     Problem List Patient Active Problem List   Diagnosis Date Noted  . S/P TKR (total knee replacement), bilateral   . Gastroesophageal reflux disease   . Post-operative pain   . Elevated blood pressure reading   . Bilateral primary osteoarthritis of knee 07/03/2017  . Lateral epicondylitis of right elbow 04/02/2013    04/04/2013, PT 04/21/2019, 2:54 PM  [facility] [address] [place], [place], [place] Phone: [phone]   Fax:  [phone]  Name: ARYIANA KLINKNER MRN: [name] Date of Birth: Mar 09, 1966

## 2019-04-24 ENCOUNTER — Ambulatory Visit: Payer: PRIVATE HEALTH INSURANCE | Admitting: Physical Therapy

## 2019-04-24 ENCOUNTER — Other Ambulatory Visit: Payer: Self-pay

## 2019-04-24 ENCOUNTER — Encounter: Payer: Self-pay | Admitting: Physical Therapy

## 2019-04-24 DIAGNOSIS — M62838 Other muscle spasm: Secondary | ICD-10-CM

## 2019-04-24 DIAGNOSIS — M25511 Pain in right shoulder: Secondary | ICD-10-CM

## 2019-04-24 DIAGNOSIS — M6281 Muscle weakness (generalized): Secondary | ICD-10-CM

## 2019-04-24 DIAGNOSIS — M25611 Stiffness of right shoulder, not elsewhere classified: Secondary | ICD-10-CM

## 2019-04-24 NOTE — Therapy (Signed)
[facility] [address], [place], [place], [address] Phone: [phone]   Fax:  [phone]  Physical Therapy Treatment/Discharge Summary   Patient Details  Name: Samantha Curtis MRN: [id number] Date of Birth: 1965/10/25 Referring Provider (PT): Melrose Nakayama, MD   Encounter Date: 04/24/2019  PT End of Session - 04/24/19 1444    Visit Number  25    Number of Visits  25    Date for PT Re-Evaluation  05/13/19    Authorization Type  W/C 25 visits approved    Authorization - Visit Number  25    Authorization - Number of Visits  25    PT Start Time  1400    PT Stop Time  2025    PT Time Calculation (min)  42 min    Activity Tolerance  Patient tolerated treatment well       Past Medical History:  Diagnosis Date  . Arthritis    "knees" (07/04/2017)  . Dysrhythmia    PALPITATIONS  . GERD (gastroesophageal reflux disease)   . High cholesterol   . Leg pain     Past Surgical History:  Procedure Laterality Date  . ENDOVENOUS ABLATION SAPHENOUS VEIN W/ LASER Left 2005   [facility]  . JOINT REPLACEMENT    . KNEE ARTHROSCOPY Right   . SHOULDER ARTHROSCOPY Right    "spurs"  . TOTAL KNEE ARTHROPLASTY Bilateral 07/03/2017   Procedure: TOTAL KNEE BILATERAL;  Surgeon: Melrose Nakayama, MD;  Location: [place];  Service: Orthopedics;  Laterality: Bilateral;  . TUBAL LIGATION    . VAGINAL HYSTERECTOMY      There were no vitals filed for this visit.  Subjective Assessment - 04/24/19 1359    Subjective  I catch myself every day rubbing my shoulder.    Currently in Pain?  Yes    Pain Score  5     Pain Location  Shoulder    Pain Orientation  Right         [facility] PT Assessment - 04/24/19 0001      Observation/Other Assessments   Focus on Therapeutic Outcomes (FOTO)   39% limitation       AROM   Right Shoulder Flexion  145 Degrees    Right Shoulder ABduction  118 Degrees    Right Shoulder Internal Rotation  --    T10   Right Shoulder External Rotation  74 Degrees      Strength   Right Shoulder Flexion  4/5    Right Shoulder ABduction  4/5    Right Shoulder Internal Rotation  4+/5    Right Shoulder External Rotation  4+/5                Comprehensive review of HEP    [facility] Adult PT Treatment/Exercise - 04/24/19 0001      Shoulder Exercises: Standing   Extension  Strengthening;Right;15 reps;Theraband    Theraband Level (Shoulder Extension)  Level 3 (Green)    Row  Strengthening;Right;15 reps    Theraband Level (Shoulder Row)  Level 3 (Green)      Shoulder Exercises: ROM/Strengthening   Other ROM/Strengthening Exercises  resisted walking fwd/back with UE at sides  green  Band 10x each;  Isometric holds for internal and external rotation 10x each      Ultrasound   Ultrasound Location  right subacromial region     Ultrasound Parameters  1.2 100% 8 min  1 Mhz   Ultrasound Goals  Pain  PT Short Term Goals - 04/24/19 1716      PT SHORT TERM GOAL #1   Title  independent with initial HEP    Status  Achieved      PT SHORT TERM GOAL #2   Title  AROM Rt shoulder flexion 120    Status  Achieved        PT Long Term Goals - 04/24/19 1411      PT LONG TERM GOAL #1   Title  Independent with advanced HEP    Status  Achieved      PT LONG TERM GOAL #2   Title  increased AROM to 160 deg Rt shoulder flexion without increased pain    Status  Partially Met      PT LONG TERM GOAL #3   Title  pt will be able to perform MMT Rt shoulder flexion and abduction 5/5 without pain for return to functional activities at work    Status  Not Met      PT LONG TERM GOAL #4   Title  FOTO </= to 25% limited    Status  Not Met      PT LONG TERM GOAL #5   Title  Pt will be able to lift 20 lb up to waist height for return to full function at work    Status  Partially Met            Plan - 04/24/19 1710    Clinical Impression Statement  The patient is scheduled to  have arthoscopic shoulder surgery in early January.  She has a good HEP suitable for this time period that does not aggravate her pain.  She was given a stronger theraband at her request for isometric, scapular strength and biceps/triceps strengthening.  Her shoulder ROM is pain limited but improved from initial evaluation.  Her strength is grossly 4 to 4+/5.  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  Recommend discharge from PT at this time with partial goals met.       Patient will benefit from skilled therapeutic intervention in order to improve the following deficits and impairments:     Visit Diagnosis: Acute pain of right shoulder  Muscle weakness (generalized)  Stiffness of right shoulder, not elsewhere classified  Other muscle spasm    PHYSICAL THERAPY DISCHARGE SUMMARY  Visits from Start of Care: 25 Current functional level related to goals / functional outcomes: See clinical impressions above    Remaining deficits: As Above   Education / Equipment: Comprehensive HEP  Plan: Patient agrees to discharge.  Patient goals were partially met. Patient is being discharged due to being pleased with the current functional level.  ?????         Problem List Patient Active Problem List   Diagnosis Date Noted  . S/P TKR (total knee replacement), bilateral   . Gastroesophageal reflux disease   . Post-operative pain   . Elevated blood pressure reading   . Bilateral primary osteoarthritis of knee 07/03/2017  . Lateral epicondylitis of right elbow 04/02/2013   Ruben Im, PT 04/24/19 5:21 PM Phone: [phone] Fax: [phone] Alvera Singh 04/24/2019, 5:18 PM  [facility] [address] [place], [place], [address] Phone: [phone]   Fax:  [phone]  Name: Samantha Curtis MRN: [id number] Date of Birth: 1966-03-31

## 2019-05-14 MED FILL — METOPROLOL TARTRATE 25 MG T: 25 | 90 days supply | Qty: 90 | Fill #1

## 2019-05-26 ENCOUNTER — Ambulatory Visit
Admission: RE | Admit: 2019-05-26 | Discharge: 2019-05-26 | Disposition: A | Discharge status: Home / Self Care | Payer: No Typology Code available for payment source | Source: Ambulatory Visit | Attending: Obstetrics and Gynecology | Admitting: Obstetrics and Gynecology

## 2019-05-26 ENCOUNTER — Other Ambulatory Visit: Payer: Self-pay

## 2019-05-26 DIAGNOSIS — Z1231 Encounter for screening mammogram for malignant neoplasm of breast: Secondary | ICD-10-CM

## 2019-05-30 ENCOUNTER — Other Ambulatory Visit: Payer: Self-pay | Admitting: Orthopaedic Surgery

## 2019-06-02 MED FILL — ATORVASTATIN 10 MG TABLET: 10 | 90 days supply | Qty: 90 | Fill #1

## 2019-06-09 NOTE — Patient Instructions (Addendum)
DUE TO COVID-19 ONLY ONE VISITOR IS ALLOWED TO COME WITH YOU AND STAY IN THE WAITING ROOM ONLY DURING PRE OP AND PROCEDURE DAY OF SURGERY. THE 1 VISITOR MAY VISIT WITH YOU AFTER SURGERY IN YOUR PRIVATE ROOM DURING VISITING HOURS ONLY!  YOU NEED TO HAVE A COVID 19 TEST ON_12/31______ @_10 :00______, THIS TEST MUST BE DONE BEFORE SURGERY, COME  [address], [place] , [id number].  ([place]) ONCE YOUR COVID TEST IS COMPLETED, PLEASE BEGIN THE QUARANTINE INSTRUCTIONS AS OUTLINED IN YOUR HANDOUT.                Samantha Curtis   Your procedure is scheduled on: 06/23/18   Report to [facility] Main  Entrance   Report to admitting at  1:00 pm      Call this number if you have problems the morning of surgery [place], NO [place].    Do not eat food After Midnight.   YOU MAY HAVE CLEAR LIQUIDS FROM MIDNIGHT UNTIL 12:00 pm.   At 12:00 PM Please finish the prescribed Pre-Surgery  drink.   Nothing by mouth after you finish the  drink !   Take these medicines the morning of surgery with A SIP OF WATER: Claritin, Metoprolol,Pepcid                                 You may not have any metal on your body including hair pins and              piercings  Do not wear jewelry, make-up, lotions, powders or perfumes, deodorant             Do not wear nail polish on your fingernails.  Do not shave  48 hours prior to surgery.              Do not bring valuables to the hospital. [place].       Patients discharged the day of surgery will not be allowed to drive home.  IF YOU ARE HAVING SURGERY AND GOING HOME THE SAME DAY, YOU MUST HAVE AN ADULT TO DRIVE YOU HOME AND BE WITH YOU FOR 24 HOURS.   YOU MAY GO HOME BY TAXI OR UBER OR ORTHERWISE, BUT AN ADULT MUST ACCOMPANY YOU HOME AND STAY WITH YOU FOR 24 HOURS.  Name and phone number of  your driver:  Special Instructions: N/A              Please read over the following fact sheets you were given: _____________________________________________________________________             [facility] - Preparing for Surgery  Before surgery, you can play an important role.   Because skin is not sterile, your skin needs to be as free of germs as possible.   You can reduce the number of germs on your skin by washing with CHG (chlorahexidine gluconate) soap before surgery.   CHG is an antiseptic cleaner which kills germs and bonds with the skin to continue killing germs even after washing. Please DO NOT use if you have an allergy to CHG or antibacterial soaps .  If your skin becomes reddened/irritated stop using the CHG and  inform your nurse when you arrive at Short Stay. Do not shave (including legs and underarms) for at least 48 hours prior to the first CHG shower.   Please follow these instructions carefully:  1.  Shower with CHG Soap the night before surgery and the  morning of Surgery.  2.  If you choose to wash your hair, wash your hair first as usual with your  normal  shampoo.  3.  After you shampoo, rinse your hair and body thoroughly to remove the  shampoo.                                        4.  Use CHG as you would any other liquid soap.  You can apply chg directly  to the skin and wash                       Gently with a scrungie or clean washcloth.  5.  Apply the CHG Soap to your body ONLY FROM THE NECK DOWN.   Do not use on face/ open                           Wound or open sores. Avoid contact with eyes, ears mouth and genitals (private parts).                       Wash face,  Genitals (private parts) with your normal soap.             6.  Wash thoroughly, paying special attention to the area where your surgery  will be performed.  7.  Thoroughly rinse your body with warm water from the neck down.  8.  DO NOT shower/wash with your normal soap after using and rinsing  off  the CHG Soap.             9.  Pat yourself dry with a clean towel.            10.  Wear clean pajamas.            11.  Place clean sheets on your bed the night of your first shower and do not  sleep with pets. Day of Surgery : Do not apply any lotions/deodorants the morning of surgery.  Please wear clean clothes to the hospital/surgery center.  FAILURE TO FOLLOW THESE INSTRUCTIONS MAY RESULT IN THE CANCELLATION OF YOUR SURGERY PATIENT SIGNATURE_________________________________  NURSE SIGNATURE__________________________________  ________________________________________________________________________

## 2019-06-11 ENCOUNTER — Other Ambulatory Visit (HOSPITAL_COMMUNITY): Payer: Self-pay | Admitting: Orthopaedic Surgery

## 2019-06-11 ENCOUNTER — Other Ambulatory Visit: Payer: Self-pay

## 2019-06-11 ENCOUNTER — Other Ambulatory Visit: Payer: Self-pay | Admitting: Orthopaedic Surgery

## 2019-06-11 ENCOUNTER — Encounter (HOSPITAL_COMMUNITY): Payer: Self-pay

## 2019-06-11 ENCOUNTER — Encounter (HOSPITAL_COMMUNITY)
Admission: RE | Admit: 2019-06-11 | Discharge: 2019-06-11 | Disposition: A | Discharge status: Home / Self Care | Payer: No Typology Code available for payment source | Source: Ambulatory Visit | Attending: Orthopaedic Surgery | Admitting: Orthopaedic Surgery

## 2019-06-11 DIAGNOSIS — Z01818 Encounter for other preprocedural examination: Secondary | ICD-10-CM | POA: Diagnosis present

## 2019-06-11 DIAGNOSIS — M25561 Pain in right knee: Secondary | ICD-10-CM

## 2019-06-11 NOTE — Progress Notes (Signed)
PCP - Dr. Electa Sniff Cardiologist - none  Chest x-ray - 06/26/17 EKG - 06/26/17 Stress Test - no ECHO - no Cardiac Cath - no  Sleep Study - no CPAP -   Fasting Blood Sugar - NA _ times a day  Blood Thinner Instructions:ASA Aspirin Instructions:stop 5 days prior Last Dose:06/20/19  Anesthesia review:   Patient denies shortness of breath, fever, cough and chest pain at PAT appointment yes  Patient verbalized understanding of instructions that were given to them at the PAT appointment. Patient was also instructed that they will need to review over the PAT instructions again at home before surgery. yes

## 2019-06-12 ENCOUNTER — Other Ambulatory Visit (HOSPITAL_COMMUNITY)
Admission: RE | Admit: 2019-06-12 | Discharge: 2019-06-12 | Disposition: A | Discharge status: Home / Self Care | Payer: No Typology Code available for payment source | Source: Ambulatory Visit | Attending: Orthopaedic Surgery | Admitting: Orthopaedic Surgery

## 2019-06-12 DIAGNOSIS — Z96651 Presence of right artificial knee joint: Secondary | ICD-10-CM | POA: Insufficient documentation

## 2019-06-12 LAB — C-REACTIVE PROTEIN: CRP: 0.5 mg/dL (ref <1.0)

## 2019-06-12 LAB — SEDIMENTATION RATE: Sed Rate: 20 mm/hr (ref 0–22)

## 2019-06-16 ENCOUNTER — Other Ambulatory Visit: Payer: Self-pay

## 2019-06-16 ENCOUNTER — Encounter (HOSPITAL_COMMUNITY)
Admission: RE | Admit: 2019-06-16 | Discharge: 2019-06-16 | Disposition: A | Discharge status: Home / Self Care | Payer: PRIVATE HEALTH INSURANCE | Source: Ambulatory Visit | Attending: Orthopaedic Surgery | Admitting: Orthopaedic Surgery

## 2019-06-16 DIAGNOSIS — Z01812 Encounter for preprocedural laboratory examination: Secondary | ICD-10-CM | POA: Insufficient documentation

## 2019-06-16 LAB — CBC
HCT: 39 % (ref 36.0–46.0)
Hemoglobin: 12.6 g/dL (ref 12.0–15.0)
MCH: 30.8 pg (ref 26.0–34.0)
MCHC: 32.3 g/dL (ref 30.0–36.0)
MCV: 95.4 fL (ref 80.0–100.0)
Platelets: 221 10*3/uL (ref 150–400)
RBC: 4.09 MIL/uL (ref 3.87–5.11)
RDW: 12.4 % (ref 11.5–15.5)
WBC: 5.4 10*3/uL (ref 4.0–10.5)
nRBC: 0 % (ref 0.0–0.2)

## 2019-06-16 LAB — BASIC METABOLIC PANEL
Anion gap: 7 (ref 5–15)
BUN: 24 mg/dL — ABNORMAL HIGH (ref 6–20)
CO2: 27 mmol/L (ref 22–32)
Calcium: 9.4 mg/dL (ref 8.9–10.3)
Chloride: 105 mmol/L (ref 98–111)
Creatinine, Ser: 0.92 mg/dL (ref 0.44–1.00)
GFR calc Af Amer: >60 mL/min (ref >60)
GFR calc non Af Amer: >60 mL/min (ref >60)
Glucose, Bld: 91 mg/dL (ref 70–99)
Potassium: 4.4 mmol/L (ref 3.5–5.1)
Sodium: 139 mmol/L (ref 135–145)

## 2019-06-17 ENCOUNTER — Encounter (HOSPITAL_COMMUNITY)
Admission: RE | Admit: 2019-06-17 | Discharge: 2019-06-17 | Disposition: A | Discharge status: Home / Self Care | Payer: No Typology Code available for payment source | Source: Ambulatory Visit | Attending: Orthopaedic Surgery | Admitting: Orthopaedic Surgery

## 2019-06-17 DIAGNOSIS — M25561 Pain in right knee: Secondary | ICD-10-CM | POA: Diagnosis not present

## 2019-06-17 MED ORDER — TECHNETIUM TC 99M MEDRONATE IV KIT
20.4000 | PACK | Freq: Once | INTRAVENOUS | Status: AC | PRN
  Administered 2019-06-17: 20.4 via INTRAVENOUS

## 2019-06-18 MED FILL — NAPROXEN 500 MG TABLET: 500 | 30 days supply | Qty: 60 | Fill #1

## 2019-06-19 ENCOUNTER — Inpatient Hospital Stay (HOSPITAL_COMMUNITY): Admission: RE | Admit: 2019-06-19 | Payer: No Typology Code available for payment source | Source: Ambulatory Visit

## 2019-06-19 ENCOUNTER — Other Ambulatory Visit (HOSPITAL_COMMUNITY): Payer: No Typology Code available for payment source

## 2019-06-21 ENCOUNTER — Other Ambulatory Visit (HOSPITAL_COMMUNITY)
Admission: RE | Admit: 2019-06-21 | Discharge: 2019-06-21 | Disposition: A | Discharge status: Home / Self Care | Payer: PRIVATE HEALTH INSURANCE | Source: Ambulatory Visit | Attending: Orthopaedic Surgery | Admitting: Orthopaedic Surgery

## 2019-06-21 DIAGNOSIS — Z20822 Contact with and (suspected) exposure to covid-19: Secondary | ICD-10-CM | POA: Insufficient documentation

## 2019-06-21 DIAGNOSIS — Z01812 Encounter for preprocedural laboratory examination: Secondary | ICD-10-CM | POA: Diagnosis not present

## 2019-06-21 LAB — SARS CORONAVIRUS 2 (TAT 6-24 HRS): SARS Coronavirus 2: NEGATIVE

## 2019-06-23 NOTE — H&P (Signed)
Samantha Curtis is an 54 y.o. female.   Chief Complaint: Right shoulder pain HPI: Samantha Curtis continues with shoulder pain.  Samantha Curtis got about 8 or 9 days of relief with Samantha Curtis most recent injection.  Samantha Curtis continues to work.  Samantha Curtis has trouble reaching up and out and trouble sleeping at night. Samantha Curtis takes Naprosyn versus Tylenol for this problem.  Samantha Curtis continues in some therapy.   MRI:  I reviewed an MRI scan films and report of a study done at [facility] on 03/01/19.  This does show an authentic anterior supraspinatus partial-thickness tear on the bursal aspect.  Samantha Curtis has no degenerative change.  Samantha Curtis does have some signal within the intra-articular portion of the biceps tendon but it is in appropriate position.  Past Medical History:  Diagnosis Date  . Arthritis    "knees" (07/04/2017)  . Dysrhythmia    PALPITATIONS  . GERD (gastroesophageal reflux disease)   . High cholesterol   . Leg pain     Past Surgical History:  Procedure Laterality Date  . ENDOVENOUS ABLATION SAPHENOUS VEIN W/ LASER Left 2005   [facility]  . JOINT REPLACEMENT    . KNEE ARTHROSCOPY Right   . SHOULDER ARTHROSCOPY Right    "spurs"  . TOTAL KNEE ARTHROPLASTY Bilateral 07/03/2017   Procedure: TOTAL KNEE BILATERAL;  Surgeon: Marcene Corning, MD;  Location: [facility] OR;  Service: Orthopedics;  Laterality: Bilateral;  . TUBAL LIGATION    . VAGINAL HYSTERECTOMY      Family History  Problem Relation Age of Onset  . Dementia Mother    Social History:  reports that Samantha Curtis has never smoked. Samantha Curtis has never used smokeless tobacco. Samantha Curtis reports that Samantha Curtis does not drink alcohol or use drugs.  Allergies:  Allergies  Allergen Reactions  . Cyclobenzaprine Hives and Rash  . Latex Other (See Comments)    Sores in mouth from dentist  . Amoxicillin-Pot Clavulanate Itching    Has patient had a PCN reaction causing immediate rash, facial/tongue/throat swelling, SOB or lightheadedness with hypotension: No Has patient had a PCN reaction causing severe  rash involving mucus membranes or skin necrosis: No Has patient had a PCN reaction that required hospitalization: No Has patient had a PCN reaction occurring within the last 10 years: No If all of the above answers are "NO", then may proceed with Cephalosporin use.   . Levofloxacin Nausea Only    No medications prior to admission.    Results for orders placed or performed during the hospital encounter of 06/21/19 (from the past 48 hour(s))  SARS CORONAVIRUS 2 (TAT 6-24 HRS) Nasopharyngeal Nasopharyngeal Swab     Status: None   Collection Time: 06/21/19 11:05 AM   Specimen: Nasopharyngeal Swab  Result Value Ref Range   SARS Coronavirus 2 NEGATIVE NEGATIVE    Comment: (NOTE) SARS-CoV-2 target nucleic acids are NOT DETECTED. The SARS-CoV-2 RNA is generally detectable in upper and lower respiratory specimens during the acute phase of infection. Negative results do not preclude SARS-CoV-2 infection, do not rule out co-infections with other pathogens, and should not be used as the sole basis for treatment or other patient management decisions. Negative results must be combined with clinical observations, patient history, and epidemiological information. The expected result is Negative. Fact Sheet for Patients: HairSlick.no Fact Sheet for Healthcare Providers: quierodirigir.com This test is not yet approved or cleared by the [place] FDA and  has been authorized for detection and/or diagnosis of SARS-CoV-2 by FDA under an Emergency Use Authorization (EUA). This  EUA will remain  in effect (meaning this test can be used) for the duration of the COVID-19 declaration under Section 56 4(b)(1) of the Act, 21 U.S.C. section 360bbb-3(b)(1), unless the authorization is terminated or revoked sooner. Performed at [facility], [place] [address] [place], [place] [address]    No results found.  Review of Systems  Musculoskeletal:  Positive for arthralgias.       Right shoulder  All other systems reviewed and are negative.   There were no vitals taken for this visit. Physical Exam  Constitutional: Samantha Curtis is oriented to person, place, and time. Samantha Curtis appears well-developed and well-nourished.  HENT:  Head: Normocephalic and atraumatic.  Eyes: Pupils are equal, round, and reactive to light.  Cardiovascular: Normal rate and regular rhythm.  Respiratory: Effort normal.  GI: Soft.  Musculoskeletal:     Cervical back: Normal range of motion.     Comments: Right shoulder motion is basically full.  Samantha Curtis has a painful arc as Samantha Curtis brings Samantha Curtis arm up and down.  Samantha Curtis has impingement in both positions but maintains excellent cuff strength.  I do not get any pain at the [facility] joint.  Sensation and motor function are intact in Samantha Curtis hands with palpable pulses on both sides.    Neurological: Samantha Curtis is alert and oriented to person, place, and time.  Skin: Skin is warm and dry.  Psychiatric: Samantha Curtis has a normal mood and affect. Samantha Curtis behavior is normal. Judgment and thought content normal.     Assessment/Plan Assessment: Right shoulder partial cuff tear by MRI 2020 injected 2  Plan: I think we can help Samantha Curtis with a shoulder arthroscopy.  Based on Samantha Curtis scan I do not think Samantha Curtis is going to need a complete repair but should do well with decompression and debridement.  I reviewed risk of anesthesia, infection, DVT.  I told Samantha Curtis Samantha Curtis will be in a sling most likely just for a day unless we find something surprising at surgery.  Samantha Curtis will require some physical therapy afterwards. In terms of work Samantha Curtis will likely miss at least 2 weeks postoperatively and potentially 4-6.  Larwance Sachs Dilynn Munroe, PA-C 06/23/2019, 9:48 AM

## 2019-06-24 ENCOUNTER — Encounter (HOSPITAL_COMMUNITY): Payer: Self-pay | Admitting: Orthopaedic Surgery

## 2019-06-24 ENCOUNTER — Encounter (HOSPITAL_COMMUNITY)
Admission: RE | Disposition: A | Discharge status: Home / Self Care | Payer: Self-pay | Source: Ambulatory Visit | Attending: Orthopaedic Surgery

## 2019-06-24 ENCOUNTER — Ambulatory Visit (HOSPITAL_COMMUNITY): Payer: PRIVATE HEALTH INSURANCE | Admitting: Physician Assistant

## 2019-06-24 ENCOUNTER — Ambulatory Visit (HOSPITAL_COMMUNITY): Payer: PRIVATE HEALTH INSURANCE | Admitting: Certified Registered Nurse Anesthetist

## 2019-06-24 ENCOUNTER — Ambulatory Visit (HOSPITAL_COMMUNITY)
Admission: RE | Admit: 2019-06-24 | Discharge: 2019-06-24 | Disposition: A | Discharge status: Home / Self Care | Payer: PRIVATE HEALTH INSURANCE | Source: Ambulatory Visit | Attending: Orthopaedic Surgery | Admitting: Orthopaedic Surgery

## 2019-06-24 DIAGNOSIS — M75101 Unspecified rotator cuff tear or rupture of right shoulder, not specified as traumatic: Secondary | ICD-10-CM | POA: Diagnosis not present

## 2019-06-24 DIAGNOSIS — M25811 Other specified joint disorders, right shoulder: Secondary | ICD-10-CM | POA: Insufficient documentation

## 2019-06-24 DIAGNOSIS — Z96653 Presence of artificial knee joint, bilateral: Secondary | ICD-10-CM | POA: Insufficient documentation

## 2019-06-24 HISTORY — PX: SHOULDER ARTHROSCOPY: SHX128

## 2019-06-24 SURGERY — ARTHROSCOPY, SHOULDER
Anesthesia: General | Site: Shoulder | Laterality: Right

## 2019-06-24 MED ORDER — FENTANYL CITRATE (PF) 100 MCG/2ML IJ SOLN
INTRAMUSCULAR | Status: AC
  Filled 2019-06-24: qty 2

## 2019-06-24 MED ORDER — DEXAMETHASONE SODIUM PHOSPHATE 10 MG/ML IJ SOLN
INTRAMUSCULAR | Status: DC | PRN
  Administered 2019-06-24: 10 mg via INTRAVENOUS

## 2019-06-24 MED ORDER — FENTANYL CITRATE (PF) 100 MCG/2ML IJ SOLN
INTRAMUSCULAR | Status: DC | PRN
  Administered 2019-06-24: 50 ug via INTRAVENOUS

## 2019-06-24 MED ORDER — OXYCODONE HCL 5 MG/5ML PO SOLN: 5.0000 mg | Freq: Once | ORAL | Status: DC | PRN

## 2019-06-24 MED ORDER — MEPERIDINE HCL 50 MG/ML IJ SOLN: 6.2500 mg | INTRAMUSCULAR | Status: DC | PRN

## 2019-06-24 MED ORDER — CHLORHEXIDINE GLUCONATE 4 % EX LIQD: 60.0000 mL | Freq: Once | CUTANEOUS | Status: DC

## 2019-06-24 MED ORDER — ONDANSETRON HCL 4 MG/2ML IJ SOLN
INTRAMUSCULAR | Status: DC | PRN
  Administered 2019-06-24: 4 mg via INTRAVENOUS

## 2019-06-24 MED ORDER — OXYCODONE HCL 5 MG PO TABS: 5.0000 mg | ORAL_TABLET | Freq: Once | ORAL | Status: DC | PRN

## 2019-06-24 MED ORDER — LACTATED RINGERS IV SOLN: INTRAVENOUS | Status: DC

## 2019-06-24 MED ORDER — DEXAMETHASONE SODIUM PHOSPHATE 10 MG/ML IJ SOLN
INTRAMUSCULAR | Status: AC
  Filled 2019-06-24: qty 1

## 2019-06-24 MED ORDER — EPINEPHRINE PF 1 MG/ML IJ SOLN
INTRAMUSCULAR | Status: AC
  Filled 2019-06-24: qty 3

## 2019-06-24 MED ORDER — SUGAMMADEX SODIUM 200 MG/2ML IV SOLN
INTRAVENOUS | Status: DC | PRN
  Administered 2019-06-24: 120 mg via INTRAVENOUS

## 2019-06-24 MED ORDER — POVIDONE-IODINE 10 % EX SWAB
2.0000 "application " | Freq: Once | CUTANEOUS | Status: AC
  Administered 2019-06-24: 2 via TOPICAL

## 2019-06-24 MED ORDER — MIDAZOLAM HCL 2 MG/2ML IJ SOLN
1.0000 mg | Freq: Once | INTRAMUSCULAR | Status: AC
  Administered 2019-06-24: 14:00:00 2 mg via INTRAVENOUS
  Filled 2019-06-24: qty 2

## 2019-06-24 MED ORDER — PHENYLEPHRINE HCL-NACL 10-0.9 MG/250ML-% IV SOLN
INTRAVENOUS | Status: DC | PRN
  Administered 2019-06-24: 40 ug/min via INTRAVENOUS

## 2019-06-24 MED ORDER — BUPIVACAINE HCL (PF) 0.25 % IJ SOLN
INTRAMUSCULAR | Status: AC
  Filled 2019-06-24: qty 30

## 2019-06-24 MED ORDER — ROPIVACAINE HCL 5 MG/ML IJ SOLN
INTRAMUSCULAR | Status: DC | PRN
  Administered 2019-06-24: 30 mL via PERINEURAL

## 2019-06-24 MED ORDER — ONDANSETRON HCL 4 MG/2ML IJ SOLN
INTRAMUSCULAR | Status: AC
  Filled 2019-06-24: qty 2

## 2019-06-24 MED ORDER — PROPOFOL 10 MG/ML IV BOLUS
INTRAVENOUS | Status: DC | PRN
  Administered 2019-06-24: 100 mg via INTRAVENOUS

## 2019-06-24 MED ORDER — VANCOMYCIN HCL IN DEXTROSE 1-5 GM/200ML-% IV SOLN
1000.0000 mg | INTRAVENOUS | Status: AC
  Administered 2019-06-24: 1000 mg via INTRAVENOUS
  Filled 2019-06-24: qty 200

## 2019-06-24 MED ORDER — FENTANYL CITRATE (PF) 100 MCG/2ML IJ SOLN
50.0000 ug | Freq: Once | INTRAMUSCULAR | Status: AC
  Administered 2019-06-24: 50 ug via INTRAVENOUS
  Filled 2019-06-24: qty 2

## 2019-06-24 MED ORDER — EPINEPHRINE PF 1 MG/ML IJ SOLN
INTRAMUSCULAR | Status: DC | PRN
  Administered 2019-06-24: 1 mg

## 2019-06-24 MED ORDER — LIDOCAINE 2% (20 MG/ML) 5 ML SYRINGE
INTRAMUSCULAR | Status: AC
  Filled 2019-06-24: qty 5

## 2019-06-24 MED ORDER — 0.9 % SODIUM CHLORIDE (POUR BTL) OPTIME
TOPICAL | Status: DC | PRN
  Administered 2019-06-24: 1000 mL

## 2019-06-24 MED ORDER — PHENYLEPHRINE HCL (PRESSORS) 10 MG/ML IV SOLN
INTRAVENOUS | Status: AC
  Filled 2019-06-24: qty 1

## 2019-06-24 MED ORDER — FENTANYL CITRATE (PF) 100 MCG/2ML IJ SOLN: 25.0000 ug | INTRAMUSCULAR | Status: DC | PRN

## 2019-06-24 MED ORDER — SCOPOLAMINE 1 MG/3DAYS TD PT72
1.0000 | MEDICATED_PATCH | TRANSDERMAL | Status: DC
  Administered 2019-06-24: 1.5 mg via TRANSDERMAL
  Filled 2019-06-24: qty 1

## 2019-06-24 MED ORDER — METOCLOPRAMIDE HCL 5 MG/ML IJ SOLN
INTRAMUSCULAR | Status: AC
  Administered 2019-06-24: 10 mg via INTRAVENOUS
  Filled 2019-06-24: qty 2

## 2019-06-24 MED ORDER — HYDROCODONE-ACETAMINOPHEN 5-325 MG PO TABS: 1.0000 | ORAL_TABLET | Freq: Four times a day (QID) | ORAL | 0 refills | Status: AC | PRN

## 2019-06-24 MED ORDER — PROPOFOL 10 MG/ML IV BOLUS
INTRAVENOUS | Status: AC
  Filled 2019-06-24: qty 20

## 2019-06-24 MED ORDER — LACTATED RINGERS IR SOLN
Status: DC | PRN
  Administered 2019-06-24: 3000 mL

## 2019-06-24 MED ORDER — ROCURONIUM BROMIDE 50 MG/5ML IV SOSY
PREFILLED_SYRINGE | INTRAVENOUS | Status: DC | PRN
  Administered 2019-06-24: 50 mg via INTRAVENOUS

## 2019-06-24 MED ORDER — METOCLOPRAMIDE HCL 5 MG/ML IJ SOLN: 10.0000 mg | Freq: Once | INTRAMUSCULAR | Status: AC | PRN

## 2019-06-24 MED ORDER — LIDOCAINE 2% (20 MG/ML) 5 ML SYRINGE
INTRAMUSCULAR | Status: DC | PRN
  Administered 2019-06-24: 40 mg via INTRAVENOUS

## 2019-06-24 SURGICAL SUPPLY — 41 items
BLADE EXCALIBUR 4.0X13 (MISCELLANEOUS) ×2 IMPLANT
BLADE SURG SZ11 CARB STEEL (BLADE) ×2 IMPLANT
BURR OVAL 8 FLU 4.0X13 (MISCELLANEOUS) ×2 IMPLANT
CANNULA SHOULDER 7CM (CANNULA) ×2 IMPLANT
CANNULA TWIST IN 8.25X7CM (CANNULA) ×2 IMPLANT
COVER SURGICAL LIGHT HANDLE (MISCELLANEOUS) ×2 IMPLANT
COVER WAND RF STERILE (DRAPES) IMPLANT
DRAPE SHEET LG 3/4 BI-LAMINATE (DRAPES) ×4 IMPLANT
DRAPE SHOULDER BEACH CHAIR (DRAPES) ×2 IMPLANT
DRAPE SURG 17X11 SM STRL (DRAPES) ×2 IMPLANT
DRAPE U-SHAPE 47X51 STRL (DRAPES) ×2 IMPLANT
DRSG ADAPTIC 3X8 NADH LF (GAUZE/BANDAGES/DRESSINGS) ×2 IMPLANT
DRSG CURAD 3X16 NADH (PACKING) ×2 IMPLANT
DRSG EMULSION OIL 3X3 NADH (GAUZE/BANDAGES/DRESSINGS) ×2 IMPLANT
DRSG PAD ABDOMINAL 8X10 ST (GAUZE/BANDAGES/DRESSINGS) ×6 IMPLANT
DURAPREP 26ML APPLICATOR (WOUND CARE) ×2 IMPLANT
GAUZE SPONGE 4X4 12PLY STRL (GAUZE/BANDAGES/DRESSINGS) ×2 IMPLANT
GLOVE BIO SURGEON STRL SZ8 (GLOVE) ×4 IMPLANT
GLOVE BIOGEL PI IND STRL 8 (GLOVE) ×2 IMPLANT
GLOVE BIOGEL PI INDICATOR 8 (GLOVE) ×2
KIT BASIN OR (CUSTOM PROCEDURE TRAY) ×2 IMPLANT
KIT POSITION SHOULDER SCHLEI (MISCELLANEOUS) ×2 IMPLANT
KIT TURNOVER KIT A (KITS) IMPLANT
MANIFOLD NEPTUNE II (INSTRUMENTS) ×2 IMPLANT
NDL SAFETY ECLIPSE 18X1.5 (NEEDLE) ×1 IMPLANT
NEEDLE HYPO 18GX1.5 SHARP (NEEDLE) ×1
NEEDLE SPNL 18GX3.5 QUINCKE PK (NEEDLE) ×2 IMPLANT
PACK SHOULDER (CUSTOM PROCEDURE TRAY) ×2 IMPLANT
PENCIL SMOKE EVACUATOR (MISCELLANEOUS) IMPLANT
PROBE BIPOLAR ATHRO 135MM 90D (MISCELLANEOUS) ×2 IMPLANT
PROTECTOR NERVE ULNAR (MISCELLANEOUS) ×2 IMPLANT
RESTRAINT HEAD UNIVERSAL NS (MISCELLANEOUS) ×2 IMPLANT
SLING ARM FOAM STRAP SML (SOFTGOODS) ×2 IMPLANT
SLING ARM IMMOBILIZER LRG (SOFTGOODS) IMPLANT
SLING ARM IMMOBILIZER MED (SOFTGOODS) ×2 IMPLANT
SUT ETHILON 4 0 PS 2 18 (SUTURE) ×2 IMPLANT
SYR 3ML LL SCALE MARK (SYRINGE) ×2 IMPLANT
SYR CONTROL 10ML LL (SYRINGE) ×2 IMPLANT
TAPE CLOTH SURG 6X10 WHT LF (GAUZE/BANDAGES/DRESSINGS) ×2 IMPLANT
TUBING ARTHROSCOPY IRRIG 16FT (MISCELLANEOUS) ×2 IMPLANT
TUBING CONNECTING 10 (TUBING) ×2 IMPLANT

## 2019-06-24 NOTE — Op Note (Signed)
LIANE TRIBBEY [id number] 06/24/2019   PRE-OP DIAGNOSIS: right sh partial RCT   POST-OP DIAGNOSIS: same  PROCEDURE: sh scope  ANESTHESIA: general and block  Velna Ochs   Dictation #:  [id number]

## 2019-06-24 NOTE — Progress Notes (Signed)
Assisted Dr. Foster with right, ultrasound guided, interscalene  block. Side rails up, monitors on throughout procedure. See vital signs in flow sheet. Tolerated Procedure well. 

## 2019-06-24 NOTE — Anesthesia Procedure Notes (Signed)
Procedure Name: Intubation Date/Time: 06/24/2019 3:20 PM Performed by: Montel Clock, CRNA Pre-anesthesia Checklist: Patient identified, Emergency Drugs available, Suction available, Patient being monitored and Timeout performed Patient Re-evaluated:Patient Re-evaluated prior to induction Oxygen Delivery Method: Circle system utilized Preoxygenation: Pre-oxygenation with 100% oxygen Induction Type: IV induction Ventilation: Mask ventilation without difficulty Laryngoscope Size: Mac and 3 Grade View: Grade I Tube type: Oral Tube size: 7.0 mm Number of attempts: 1 Airway Equipment and Method: Stylet Placement Confirmation: ETT inserted through vocal cords under direct vision,  positive ETCO2 and breath sounds checked- equal and bilateral Secured at: 21 cm Tube secured with: Tape Dental Injury: Teeth and Oropharynx as per pre-operative assessment

## 2019-06-24 NOTE — Anesthesia Preprocedure Evaluation (Signed)
Anesthesia Evaluation  Patient identified by MRN, date of birth, ID band Patient awake    Reviewed: Allergy & Precautions, NPO status , Patient's Chart, lab work & pertinent test results  Airway Mallampati: II  TM Distance: >3 FB Neck ROM: Full    Dental no notable dental hx. (+) Teeth Intact   Pulmonary neg pulmonary ROS,    Pulmonary exam normal breath sounds clear to auscultation       Cardiovascular Normal cardiovascular exam+ dysrhythmias  Rhythm:Regular Rate:Normal     Neuro/Psych negative neurological ROS  negative psych ROS   GI/Hepatic Neg liver ROS, GERD  ,  Endo/Other  negative endocrine ROS  Renal/GU negative Renal ROS  negative genitourinary   Musculoskeletal  (+) Arthritis , Osteoarthritis,  Partial tear right rotator cuff   Abdominal   Peds  Hematology negative hematology ROS (+)   Anesthesia Other Findings   Reproductive/Obstetrics                             Anesthesia Physical Anesthesia Plan  ASA: II  Anesthesia Plan: General   Post-op Pain Management:    Induction: Intravenous  PONV Risk Score and Plan: 4 or greater and Ondansetron, Propofol infusion, Treatment may vary due to age or medical condition and Scopolamine patch - Pre-op  Airway Management Planned: Oral ETT  Additional Equipment:   Intra-op Plan:   Post-operative Plan: Extubation in OR  Informed Consent: I have reviewed the patients History and Physical, chart, labs and discussed the procedure including the risks, benefits and alternatives for the proposed anesthesia with the patient or authorized representative who has indicated his/her understanding and acceptance.     Dental advisory given  Plan Discussed with: CRNA and Surgeon  Anesthesia Plan Comments:         Anesthesia Quick Evaluation

## 2019-06-24 NOTE — Anesthesia Postprocedure Evaluation (Signed)
Anesthesia Post Note  Patient: Samantha Curtis  Procedure(s) Performed: RIGHT SHOULDER ARTHROSCOPY; CHONDROPLASTY AND DEBRIEDMENT (Right Shoulder)     Patient location during evaluation: PACU Anesthesia Type: General Level of consciousness: awake and alert, patient cooperative and oriented Pain management: pain level controlled Vital Signs Assessment: post-procedure vital signs reviewed and stable Respiratory status: spontaneous breathing, nonlabored ventilation and respiratory function stable Cardiovascular status: blood pressure returned to baseline and stable Postop Assessment: no apparent nausea or vomiting Anesthetic complications: no    Last Vitals:  Vitals:   06/24/19 1715 06/24/19 1745  BP: 119/73 113/66  Pulse: (!) 54 (!) 56  Resp: 13 13  Temp: (!) 36.4 C (!) 36.4 C  SpO2: 99% 97%    Last Pain:  Vitals:   06/24/19 1715  TempSrc:   PainSc: 0-No pain                 Samantha Curtis,E. Aitanna Haubner

## 2019-06-24 NOTE — Anesthesia Procedure Notes (Signed)
Anesthesia Regional Block: Interscalene brachial plexus block   Pre-Anesthetic Checklist: ,, timeout performed, Correct Patient, Correct Site, Correct Laterality, Correct Procedure, Correct Position, site marked, Risks and benefits discussed,  Surgical consent,  Pre-op evaluation,  At surgeon's request and post-op pain management  Laterality: Right  Prep: chloraprep       Needles:  Injection technique: Single-shot  Needle Type: Echogenic Stimulator Needle     Needle Length: 9cm  Needle Gauge: 21   Needle insertion depth: 6 cm   Additional Needles:   Procedures:,,,, ultrasound used (permanent image in chart),,,,  Narrative:  Start time: 06/24/2019 2:05 PM End time: 06/24/2019 2:10 PM Injection made incrementally with aspirations every 5 mL.  Performed by: Personally  Anesthesiologist: Mal Amabile, MD  Additional Notes: Timeout performed. Patient sedated. Relevant anatomy ID'd using [place]. Incremental 2-49ml injection of LA with frequent aspiration. Patient tolerated procedure well.        Right Interscalene Block

## 2019-06-24 NOTE — Interval H&P Note (Signed)
History and Physical Interval Note:  06/24/2019 2:06 PM  Samantha Curtis  has presented today for surgery, with the diagnosis of RIGHT SHOULDER PARTIAL ROTATOR CUFF TEAR.  The various methods of treatment have been discussed with the patient and family. After consideration of risks, benefits and other options for treatment, the patient has consented to  Procedure(s): RIGHT SHOULDER ARTHROSCOPY (Right) as a surgical intervention.  The patient's history has been reviewed, patient examined, no change in status, stable for surgery.  I have reviewed the patient's chart and labs.  Questions were answered to the patient's satisfaction.     Velna Ochs

## 2019-06-24 NOTE — Transfer of Care (Signed)
Immediate Anesthesia Transfer of Care Note  Patient: Samantha Curtis  Procedure(s) Performed: RIGHT SHOULDER ARTHROSCOPY (Right Shoulder)  Patient Location: PACU  Anesthesia Type:GA combined with regional for post-op pain  Level of Consciousness: drowsy and patient cooperative  Airway & Oxygen Therapy: Patient Spontanous Breathing and Patient connected to face mask oxygen  Post-op Assessment: Report given to RN and Post -op Vital signs reviewed and stable  Post vital signs: Reviewed and stable  Last Vitals:  Vitals Value Taken Time  BP 149/80 06/24/19 1618  Temp    Pulse 72 06/24/19 1621  Resp 15 06/24/19 1621  SpO2 100 % 06/24/19 1621  Vitals shown include unvalidated device data.  Last Pain:  Vitals:   06/24/19 1450  TempSrc:   PainSc: 0-No pain         Complications: No apparent anesthesia complications

## 2019-06-24 NOTE — Op Note (Signed)
NAME: Samantha Curtis, Samantha Curtis MEDICAL RECORD [id number] ACCOUNT [id number] DATE OF BIRTH:April 08, 1966 FACILITY: [facility] LOCATION: [facility] PHYSICIAN:Eiliana Drone Janann Colonel, MD  OPERATIVE REPORT  DATE OF PROCEDURE:  06/24/2019  PREOPERATIVE DIAGNOSES:   1.  Right shoulder partial rotator cuff tear. 2.  Right shoulder impingement.  POSTOPERATIVE DIAGNOSES:  1.  Right shoulder partial rotator cuff tear. 2.  Right shoulder impingement.  PROCEDURES: 1.  Right shoulder arthroscopic debridement. 2.  Right shoulder arthroscopic acromioplasty.  ANESTHESIA:  General and block.  ATTENDING SURGEON:  Marcene Corning MD  ASSISTANT:  Ardelia Mems, PA.  INDICATIONS:  The patient is a 54 year old woman with a long history of right shoulder pain.  This has persisted despite therapeutic exercise and subacromial injections, which have afforded her transient relief.  She has pain, which limits her ability to  rest and use her arm and do her job.  By MRI scan, she has a partial thickness rotator cuff tear.  She is offered arthroscopic debridement versus repair.  Informed operative consent was obtained after discussion of possible complications, including  reaction to anesthesia and infection.  SUMMARY OF FINDINGS OF PROCEDURE:  Under general anesthesia and a block, a right shoulder arthroscopy was performed.  The glenohumeral joint showed no degenerative change and the biceps tendon looked normal.  She did have a partial thickness articular  aspect tear of the rotator cuff involving the supraspinatus and a small portion of the infraspinatus.  This required a debridement, but did not constitute more than 10% the thickness of the rotator cuff.  In the subacromial space, she had some bursitis  and a moderately prominent subacromial morphology.  An acromioplasty was done.  I debrided here as well, but found no tear worthy of repair.  She was scheduled to go home the same day.  DESCRIPTION OF PROCEDURE:  The patient was  taken to the operating suite where general anesthetic was applied without difficulty.  She was also given a block in the preanesthesia area.  She was positioned in beach chair position and prepped and draped in  normal sterile fashion.  After the administration of preoperative IV vancomycin and an appropriate time-out, an arthroscopy of the right shoulder was performed through a total of 3 portals.  Findings were as noted above and procedure consisted initially  of the debridement of the partial thickness rotator cuff tear.  I then debrided the subacromial space as well.  This was an extensive debridement.  Again, I could find no tear worthy of repair.  We then performed the acromioplasty.  This was done with  the bur in the lateral position, followed by transfer of the bur to the posterior position.  The shoulder was thoroughly irrigated, followed by removal of arthroscopic equipment.  We reapproximated portals loosely with nylon, followed by Adaptic, dry  gauze and tape.  Estimated blood loss and intraoperative fluids can be obtained from anesthesia records.  DISPOSITION:  The patient was extubated in the operating room and taken to the recovery room in stable condition.  Plans were for her to go home same day and to follow up in the office in less than a week.  I will contact her by phone tonight.  VN/NUANCE  D:06/24/2019 T:06/24/2019 JOB:[id number]

## 2019-07-03 ENCOUNTER — Encounter: Payer: Self-pay | Admitting: Physical Therapy

## 2019-07-03 ENCOUNTER — Other Ambulatory Visit: Payer: Self-pay

## 2019-07-03 ENCOUNTER — Ambulatory Visit: Payer: PRIVATE HEALTH INSURANCE | Attending: Orthopaedic Surgery | Admitting: Physical Therapy

## 2019-07-03 DIAGNOSIS — M6281 Muscle weakness (generalized): Secondary | ICD-10-CM | POA: Insufficient documentation

## 2019-07-03 DIAGNOSIS — M25511 Pain in right shoulder: Secondary | ICD-10-CM | POA: Diagnosis not present

## 2019-07-03 DIAGNOSIS — M25611 Stiffness of right shoulder, not elsewhere classified: Secondary | ICD-10-CM | POA: Diagnosis present

## 2019-07-03 DIAGNOSIS — M62838 Other muscle spasm: Secondary | ICD-10-CM | POA: Insufficient documentation

## 2019-07-03 DIAGNOSIS — R600 Localized edema: Secondary | ICD-10-CM | POA: Diagnosis present

## 2019-07-03 NOTE — Patient Instructions (Signed)
Access Code: [email or web address]  URL: [email or web address]  Date: 07/03/2019  Prepared by: Donita Brooks    Exercises Standing Backward Shoulder Rolls- 10-15 reps- 1x daily- 7x weekly  Seated Scapular Retraction- 10 reps- 3 sets- 1x daily- 7x weekly  Horizontal Shoulder Pendulum with Table Support- 20 reps- 1 sets- 1x daily- 7x weekly  Standing Horizontal Shoulder Pendulum Supported with Arm Bent- 20 reps- 1 sets- 1x daily- 7x weekly    [facility] [address], [address] [place], [place] [address] Phone # [phone] Fax [phone]

## 2019-07-03 NOTE — Therapy (Signed)
[facility] [address], [place], [place], [address] Phone: [phone]   Fax:  [phone]  Physical Therapy Evaluation  Patient Details  Name: Samantha Curtis MRN: [id number] Date of Birth: 1966-06-15 Referring Provider (PT): Melrose Nakayama, MD   Encounter Date: 07/03/2019  PT End of Session - 07/03/19 1526    Visit Number  1    Date for PT Re-Evaluation  08/01/19    Authorization Type  [facility] 1 eval and 11 visits    Authorization Time Period  07/03/19    Authorization - Visit Number  1    Authorization - Number of Visits  12    PT Start Time  1937    PT Stop Time  1530    PT Time Calculation (min)  45 min    Activity Tolerance  Patient limited by pain    Behavior During Therapy  [facility] for tasks assessed/performed       Past Medical History:  Diagnosis Date  . Arthritis    "knees" (07/04/2017)  . Dysrhythmia    PALPITATIONS  . GERD (gastroesophageal reflux disease)   . High cholesterol   . Leg pain     Past Surgical History:  Procedure Laterality Date  . ENDOVENOUS ABLATION SAPHENOUS VEIN W/ LASER Left 2005   [facility]  . JOINT REPLACEMENT    . KNEE ARTHROSCOPY Right   . SHOULDER ARTHROSCOPY Right    "spurs"  . SHOULDER ARTHROSCOPY Right 06/24/2019   Procedure: RIGHT SHOULDER ARTHROSCOPY; CHONDROPLASTY AND DEBRIEDMENT;  Surgeon: Melrose Nakayama, MD;  [facility]: [facility];  Service: Orthopedics;  Laterality: Right;  . TOTAL KNEE ARTHROPLASTY Bilateral 07/03/2017   Procedure: TOTAL KNEE BILATERAL;  Surgeon: Melrose Nakayama, MD;  Location: [place];  Service: Orthopedics;  Laterality: Bilateral;  . TUBAL LIGATION    . VAGINAL HYSTERECTOMY      There were no vitals filed for this visit.   Subjective Assessment - 07/03/19 1445    Subjective  Pt states that she underwent Rt shoulder arthroscopy on 06/24/19. She has been doing ok since then, but sleep is still difficult.    Pertinent History  B knee replacement     Limitations  Lifting    Patient Stated Goals  improve strength and pain of Rt shoulder    Currently in Pain?  No/denies    Pain Location  Shoulder    Pain Orientation  Right;Anterior    Pain Descriptors / Indicators  Sore    Pain Type  Surgical pain    Pain Radiating Towards  none    Pain Onset  1 to 4 weeks ago    Pain Frequency  Intermittent    Aggravating Factors   reaching overhead, sleeping on the shoulder    Pain Relieving Factors  avoiding irritating movement         [facility] PT Assessment - 07/03/19 0001      Assessment   Medical Diagnosis  Rt shoulder arthroscopy    Referring Provider (PT)  Melrose Nakayama, MD    Onset Date/Surgical Date  06/24/19    Hand Dominance  Right    Next MD Visit  07/18/19    Prior Therapy  OPPT 25 visits prior to surgery       Precautions   Precautions  None      Balance Screen   Has the patient fallen in the past 6 months  No    Has the patient had a decrease in activity level  because of a fear of falling?   No    Is the patient reluctant to leave their home because of a fear of falling?   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       Cognition   Overall Cognitive Status  Within Functional Limits for tasks assessed      Observation/Other Assessments   Observations  surgical sites healing well, swelling noted over anterior shoulder     Focus on Therapeutic Outcomes (FOTO)   50% limited       ROM / Strength   AROM / PROM / Strength  AROM;Strength;PROM      AROM   AROM Assessment Site  Shoulder    Right/Left Shoulder  Right    Right Shoulder Flexion  130 Degrees   painful   Right Shoulder ABduction  80 Degrees   painful     PROM   Overall PROM Comments  pt heavily guarded with passive ROM, this was deffered at this time      Strength   Overall Strength Comments  pain limited all directions     Strength Assessment Site  Shoulder    Right/Left Shoulder  Right     Right Shoulder Flexion  2+/5    Right Shoulder ABduction  2+/5    Right Shoulder Internal Rotation  3-/5    Right Shoulder External Rotation  3/5      Palpation   Palpation comment  pt with heightened pain response to light palpation of anterior shoulder, muscle spasm and tenderness palpated Rt upper traps/scalenes; hypomobile 1st rib                 Objective measurements completed on examination: See above findings.      [facility] Adult PT Treatment/Exercise - 07/03/19 0001      Exercises   Exercises  Shoulder      Shoulder Exercises: Seated   Other Seated Exercises  backwards shoulder rolls  x10 reps     Other Seated Exercises  scap squeeze 3x3 sec hold, HEP demo       Shoulder Exercises: Standing   Other Standing Exercises  straight arm pendulums x10 reps side/side, flex/ext; Rt elbow supported pendulums Lt/Rt x5 reps HEP demo       Modalities   Modalities  Vasopneumatic      Vasopneumatic   Number Minutes Vasopneumatic   10 minutes    Vasopnuematic Location   Shoulder   Rt   Vasopneumatic Pressure  Low   pt unable to tolerate medium   Vasopneumatic Temperature   34      Manual Therapy   Manual therapy comments  STM Rt upper trap             PT Education - 07/03/19 1524    Education Details  eval findings/POC; implemented HEP    Person(s) Educated  Patient    Methods  Explanation;Handout;Verbal cues    Comprehension  Verbalized understanding;Returned demonstration       PT Short Term Goals - 07/03/19 1637      PT SHORT TERM GOAL #1   Title  Pt will be independent with initial HEP to decrease inflammation and increase ROM.    Time  2    Period  Weeks    Status  New    Target Date  07/17/19        PT Long Term  Goals - 07/03/19 1638      PT LONG TERM GOAL #1   Title  Pt will have greater than 140 deg of active Rt shoulder flexion.    Time  4    Period  Weeks    Status  New    Target Date  08/01/19      PT LONG TERM GOAL #2   Title   Pt will have atleast 4/5 MMT strength of the Rt shoulder to increase her ability to reach overhead into the kitchen cabinet.    Time  4    Period  Weeks    Status  New      PT LONG TERM GOAL #3   Title  Pt will report atleast 40% improvement in her pain with sleeping.    Time  4    Period  Weeks    Status  New      PT LONG TERM GOAL #4   Title  Pt will have no more than 38% limitation on FOTO outcome measure    Time  4    Period  Weeks    Status  New             Plan - 07/03/19 1538    Clinical Impression Statement  Pt is a pleasant 54 y.o F referred to OPPT with complaints of Rt shoulder stiffness, pain and mild swelling following Rt shoulder arthroscopy/debridement on 06/24/19. She presents with limited active Rt shoulder flexion to 130 deg and abduction to 80 degrees. Passive assessment was impaired secondary to muscle guarding, but this appears limited as well. Pt has palpable tenderness and muscle spasm of the upper trap secondary to guarding with daily activity. In addition, pt is currently having difficulty sleeping due to pain. 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    Personal Factors and Comorbidities  Time since onset of injury/illness/exacerbation;Past/Current Experience    Examination-Activity Limitations  Lift;Dressing;Sleep    Stability/Clinical Decision Making  Stable/Uncomplicated    Clinical Decision Making  Low    Rehab Potential  Good    PT Frequency  Other (comment)   2-3x/week   PT Duration  4 weeks    PT Treatment/Interventions  ADLs/Self Care Home Management;Aquatic Therapy;Electrical Stimulation;Moist Heat;Cryotherapy;Iontophoresis 4mg /ml Dexamethasone;Therapeutic activities;Therapeutic exercise;Neuromuscular re-education;Manual techniques;Dry needling;Passive range of motion;Scar mobilization;Patient/family education;Taping    PT  Next Visit Plan  modalities/manual as needed for pain relief; active assisted shoulder ROM; scap strengthening    PT Home Exercise Plan     Recommended Other Services  none    Consulted and Agree with Plan of Care  Patient       Patient will benefit from skilled therapeutic intervention in order to improve the following deficits and impairments:  Pain, Increased muscle spasms, Decreased mobility, Decreased activity tolerance, Decreased range of motion, Decreased strength, Hypomobility, Impaired flexibility, Increased edema  Visit Diagnosis: Acute pain of right shoulder  Stiffness of right shoulder, not elsewhere classified  Localized edema  Muscle weakness (generalized)  Other muscle spasm     Problem List Patient Active Problem List   Diagnosis Date Noted  . S/P TKR (total knee replacement), bilateral   . Gastroesophageal reflux disease   . Post-operative pain   . Elevated blood pressure reading   . Bilateral primary osteoarthritis of knee 07/03/2017  . Lateral epicondylitis of right elbow 04/02/2013  4:45 PM,07/03/19 Donita Brooks PT, DPT [facility]  [phone]  [facility] [address] [place], [place], [address] Phone: [phone]   Fax:  [phone]  Name: Samantha Curtis MRN: [id number] Date of Birth: 04-06-1966

## 2019-07-07 ENCOUNTER — Other Ambulatory Visit: Payer: Self-pay

## 2019-07-07 ENCOUNTER — Ambulatory Visit: Payer: PRIVATE HEALTH INSURANCE

## 2019-07-07 DIAGNOSIS — M25511 Pain in right shoulder: Secondary | ICD-10-CM

## 2019-07-07 DIAGNOSIS — M25611 Stiffness of right shoulder, not elsewhere classified: Secondary | ICD-10-CM

## 2019-07-07 DIAGNOSIS — M6281 Muscle weakness (generalized): Secondary | ICD-10-CM

## 2019-07-07 DIAGNOSIS — R600 Localized edema: Secondary | ICD-10-CM

## 2019-07-07 NOTE — Therapy (Signed)
[facility] [address] [place], [place], [address] Phone: [phone]   Fax:  [phone]  Physical Therapy Treatment  Patient Details  Name: Samantha Curtis MRN: [id number] Date of Birth: 1966/02/27 Referring Provider (PT): Marcene Corning, MD   Encounter Date: 07/07/2019  PT End of Session - 07/07/19 1005    Visit Number  2    Date for PT Re-Evaluation  08/01/19    Authorization - Visit Number  2    Authorization - Number of Visits  12    PT Start Time  0933    PT Stop Time  1017    PT Time Calculation (min)  44 min    Activity Tolerance  Patient limited by pain    Behavior During Therapy  [facility] for tasks assessed/performed       Past Medical History:  Diagnosis Date  . Arthritis    "knees" (07/04/2017)  . Dysrhythmia    PALPITATIONS  . GERD (gastroesophageal reflux disease)   . High cholesterol   . Leg pain     Past Surgical History:  Procedure Laterality Date  . ENDOVENOUS ABLATION SAPHENOUS VEIN W/ LASER Left 2005   [facility]  . JOINT REPLACEMENT    . KNEE ARTHROSCOPY Right   . SHOULDER ARTHROSCOPY Right    "spurs"  . SHOULDER ARTHROSCOPY Right 06/24/2019   Procedure: RIGHT SHOULDER ARTHROSCOPY; CHONDROPLASTY AND DEBRIEDMENT;  Surgeon: Marcene Corning, MD;  Location: [facility];  Service: Orthopedics;  Laterality: Right;  . TOTAL KNEE ARTHROPLASTY Bilateral 07/03/2017   Procedure: TOTAL KNEE BILATERAL;  Surgeon: Marcene Corning, MD;  [facility];  Service: Orthopedics;  Laterality: Bilateral;  . TUBAL LIGATION    . VAGINAL HYSTERECTOMY      There were no vitals filed for this visit.  Subjective Assessment - 07/07/19 0934    Subjective  I am feeling stiff this morning.  I am having trouble sleeping at night.    Currently in Pain?  Yes    Pain Score  5     Pain Location  Shoulder    Pain Orientation  Right;Anterior    Pain Descriptors / Indicators  Sore    Pain Type  Surgical pain    Pain Onset  1 to 4 weeks ago    Pain Frequency  Intermittent    Aggravating Factors   reaching overhead, movement of the Rt arm, sleep    Pain Relieving Factors  Naproxen                       [facility] Adult PT Treatment/Exercise - 07/07/19 0001      Shoulder Exercises: Supine   Flexion Limitations  chest press and overhead flexion with cane 2x5 each    Other Supine Exercises  extension: isometrics 5" 2x10      Shoulder Exercises: Pulleys   Flexion  3 minutes      Vasopneumatic   Number Minutes Vasopneumatic   10 minutes    Vasopnuematic Location   Shoulder   Rt   Vasopneumatic Pressure  Low   pt unable to tolerate medium   Vasopneumatic Temperature   34      Manual Therapy   Manual Therapy  Passive ROM    Manual therapy comments  P/ROM to Rt shoulder into flexion, IR and ER to tolerance.  Pt is guarded with this.             PT Education - 07/07/19  9937    Education Details  Access Code: [email or web address]    Person(s) Educated  Patient    Methods  Explanation;Demonstration;Handout    Comprehension  Verbalized understanding;Returned demonstration       PT Short Term Goals - 07/03/19 1637      PT SHORT TERM GOAL #1   Title  Pt will be independent with initial HEP to decrease inflammation and increase ROM.    Time  2    Period  Weeks    Status  New    Target Date  07/17/19        PT Long Term Goals - 07/03/19 1638      PT LONG TERM GOAL #1   Title  Pt will have greater than 140 deg of active Rt shoulder flexion.    Time  4    Period  Weeks    Status  New    Target Date  08/01/19      PT LONG TERM GOAL #2   Title  Pt will have atleast 4/5 MMT strength of the Rt shoulder to increase her ability to reach overhead into the kitchen cabinet.    Time  4    Period  Weeks    Status  New      PT LONG TERM GOAL #3   Title  Pt will report atleast 40% improvement in her pain with sleeping.    Time  4    Period  Weeks    Status  New      PT LONG TERM  GOAL #4   Title  Pt will have no more than 38% limitation on FOTO outcome measure    Time  4    Period  Weeks    Status  New            Plan - 07/07/19 8101    Clinical Impression Statement  Pt with first time follow-up after evaluation.  Pt demonstrated improved Rt shoulder flexibility and movement with exercise today.  Pt is guarded with overhead movement/flexion and required tactile and verbal cues to reduce scapular elevation and guarding.  Pt will continue to benefit from skilled PT to improve Rt shoulder A/ROM, gentle strength and endurance to allow for return to prior level of function.    Rehab Potential  Good    PT Treatment/Interventions  ADLs/Self Care Home Management;Aquatic Therapy;Electrical Stimulation;Moist Heat;Cryotherapy;Iontophoresis 4mg /ml Dexamethasone;Therapeutic activities;Therapeutic exercise;Neuromuscular re-education;Manual techniques;Dry needling;Passive range of motion;Scar mobilization;Patient/family education;Taping    PT Next Visit Plan  shoulder ROM, isometric strength, scapular strength, pain relief as needed.    PT Home Exercise Plan  [email or web address]    Consulted and Agree with Plan of Care  Patient       Patient will benefit from skilled therapeutic intervention in order to improve the following deficits and impairments:  Pain, Increased muscle spasms, Decreased mobility, Decreased activity tolerance, Decreased range of motion, Decreased strength, Hypomobility, Impaired flexibility, Increased edema  Visit Diagnosis: Acute pain of right shoulder  Stiffness of right shoulder, not elsewhere classified  Localized edema  Muscle weakness (generalized)     Problem List Patient Active Problem List   Diagnosis Date Noted  . S/P TKR (total knee replacement), bilateral   . Gastroesophageal reflux disease   . Post-operative pain   . Elevated blood pressure reading   . Bilateral primary osteoarthritis of knee 07/03/2017  . Lateral epicondylitis of right  elbow 04/02/2013    04/04/2013, PT 07/07/19 10:08 AM  [facility] [address] [place], [place], [address] Phone: [phone]   Fax:  [phone]  Name: [name] MRN: [id number] Date of Birth: 07-16-1965

## 2019-07-07 NOTE — Patient Instructions (Signed)
Access Code: [email or web address]  URL: [email or web address]  Date: 07/07/2019  Prepared by: Lorrene Reid   Exercises  Supine Shoulder Flexion Extension AAROM with Dowel - 5 reps - 1-2 sets - 10 hold - 2x daily - 7x weekly Supine Shoulder Press AAROM in Abduction with Dowel - 10 reps                   - 1-2 sets - 2x daily - 7x weekly Supine Isometric Shoulder Extension with Towel - 10 reps - 2 sets - 5 hold - 2x daily - 7x weekly

## 2019-07-09 ENCOUNTER — Ambulatory Visit: Payer: PRIVATE HEALTH INSURANCE

## 2019-07-09 ENCOUNTER — Other Ambulatory Visit: Payer: Self-pay

## 2019-07-09 DIAGNOSIS — M25611 Stiffness of right shoulder, not elsewhere classified: Secondary | ICD-10-CM

## 2019-07-09 DIAGNOSIS — M25511 Pain in right shoulder: Secondary | ICD-10-CM | POA: Diagnosis not present

## 2019-07-09 DIAGNOSIS — R600 Localized edema: Secondary | ICD-10-CM

## 2019-07-09 DIAGNOSIS — M6281 Muscle weakness (generalized): Secondary | ICD-10-CM

## 2019-07-09 NOTE — Therapy (Signed)
[facility] [address] [place], [place], [address] Phone: [phone]   Fax:  [phone]  Physical Therapy Treatment  Patient Details  Name: Samantha Curtis MRN: [id number] Date of Birth: Aug 25, 1965 Referring Provider (PT): Marcene Corning, MD   Encounter Date: 07/09/2019  PT End of Session - 07/09/19 1526    Visit Number  3    Date for PT Re-Evaluation  08/01/19    Authorization Type  [facility] 1 eval and 11 visits    Authorization - Visit Number  3    Authorization - Number of Visits  12    PT Start Time  1446    PT Stop Time  1526    PT Time Calculation (min)  40 min    Activity Tolerance  Patient tolerated treatment well    Behavior During Therapy  [facility] for tasks assessed/performed       Past Medical History:  Diagnosis Date  . Arthritis    "knees" (07/04/2017)  . Dysrhythmia    PALPITATIONS  . GERD (gastroesophageal reflux disease)   . High cholesterol   . Leg pain     Past Surgical History:  Procedure Laterality Date  . ENDOVENOUS ABLATION SAPHENOUS VEIN W/ LASER Left 2005   [facility]  . JOINT REPLACEMENT    . KNEE ARTHROSCOPY Right   . SHOULDER ARTHROSCOPY Right    "spurs"  . SHOULDER ARTHROSCOPY Right 06/24/2019   Procedure: RIGHT SHOULDER ARTHROSCOPY; CHONDROPLASTY AND DEBRIEDMENT;  Surgeon: Marcene Corning, MD;  [facility];  Service: Orthopedics;  Laterality: Right;  . TOTAL KNEE ARTHROPLASTY Bilateral 07/03/2017   Procedure: TOTAL KNEE BILATERAL;  Surgeon: Marcene Corning, MD;  [facility];  Service: Orthopedics;  Laterality: Bilateral;  . TUBAL LIGATION    . VAGINAL HYSTERECTOMY      There were no vitals filed for this visit.  Subjective Assessment - 07/09/19 1450    Subjective  I am feeling ok today.    Patient Stated Goals  improve strength and pain of Rt shoulder    Currently in Pain?  Yes    Pain Score  5     Pain Location  Shoulder    Pain Orientation  Right;Anterior          [facility] PT Assessment - 07/09/19 0001      AROM   Right Shoulder Flexion  135 Degrees    Right Shoulder ABduction  80 Degrees                   [facility] Adult PT Treatment/Exercise - 07/09/19 0001      Shoulder Exercises: Supine   Flexion Limitations  chest press and overhead flexion with cane 2x5 each    Other Supine Exercises  --      Shoulder Exercises: Seated   Flexion  AAROM;Right;10 reps   using UE Ranger     Shoulder Exercises: Standing   Internal Rotation  Strengthening;Right;10 reps    Theraband Level (Shoulder Internal Rotation)  Level 1 (Yellow)    Flexion  AAROM;Right;5 reps    Flexion Limitations  using finger ladder    Extension  Strengthening;Right;Theraband;10 reps    Theraband Level (Shoulder Extension)  Level 1 (Yellow)      Shoulder Exercises: Pulleys   Flexion  3 minutes    Flexion Limitations  2 sets of 3 minutes      Manual Therapy   Manual Therapy  Passive ROM    Manual therapy comments  P/ROM to Rt shoulder into flexion, IR and ER to tolerance.  Pt is guarded with this.   less guarded today              PT Short Term Goals - 07/03/19 1637      PT SHORT TERM GOAL #1   Title  Pt will be independent with initial HEP to decrease inflammation and increase ROM.    Time  2    Period  Weeks    Status  New    Target Date  07/17/19        PT Long Term Goals - 07/03/19 1638      PT LONG TERM GOAL #1   Title  Pt will have greater than 140 deg of active Rt shoulder flexion.    Time  4    Period  Weeks    Status  New    Target Date  08/01/19      PT LONG TERM GOAL #2   Title  Pt will have atleast 4/5 MMT strength of the Rt shoulder to increase her ability to reach overhead into the kitchen cabinet.    Time  4    Period  Weeks    Status  New      PT LONG TERM GOAL #3   Title  Pt will report atleast 40% improvement in her pain with sleeping.    Time  4    Period  Weeks    Status  New      PT LONG TERM GOAL #4    Title  Pt will have no more than 38% limitation on FOTO outcome measure    Time  4    Period  Weeks    Status  New            Plan - 07/09/19 1527    Clinical Impression Statement  Pt demonstrated improved movement today with reduced guarding with pulleys today.  Pt has a painful arc at 80-110 degrees  and movement is improved with gravity eliminated activity today.  Pt requires tactile and verbal cues for scapular depression with movement.  Pt with improved Rt shoulder A/ROM into flexion with less pain and guarding than initial measurements at evaluation.  Pt will continue to benefit from skilled PT to address Rt shoulder A/ROM, strength and pain/edema management.    PT Duration  4 weeks    PT Treatment/Interventions  ADLs/Self Care Home Management;Aquatic Therapy;Electrical Stimulation;Moist Heat;Cryotherapy;Iontophoresis 4mg /ml Dexamethasone;Therapeutic activities;Therapeutic exercise;Neuromuscular re-education;Manual techniques;Dry needling;Passive range of motion;Scar mobilization;Patient/family education;Taping    PT Next Visit Plan  shoulder ROM, isometric strength, scapular strength, pain relief as needed.    PT Home Exercise Plan  [email or web address]    Consulted and Agree with Plan of Care  Patient       Patient will benefit from skilled therapeutic intervention in order to improve the following deficits and impairments:  Pain, Increased muscle spasms, Decreased mobility, Decreased activity tolerance, Decreased range of motion, Decreased strength, Hypomobility, Impaired flexibility, Increased edema  Visit Diagnosis: Stiffness of right shoulder, not elsewhere classified  Acute pain of right shoulder  Localized edema  Muscle weakness (generalized)     Problem List Patient Active Problem List   Diagnosis Date Noted  . S/P TKR (total knee replacement), bilateral   . Gastroesophageal reflux disease   . Post-operative pain   . Elevated blood pressure reading   . Bilateral primary  osteoarthritis of knee 07/03/2017  . Lateral epicondylitis of right elbow 04/02/2013  Lorrene Reid, PT 07/09/19 3:29 PM  [facility] [address], [address] [place], [place], [address] Phone: [phone]   Fax:  [phone]  Name: JAZLIN TAPSCOTT MRN: [id number] Date of Birth: April 26, 1966

## 2019-07-11 ENCOUNTER — Encounter: Payer: Self-pay | Admitting: Physical Therapy

## 2019-07-11 ENCOUNTER — Ambulatory Visit: Payer: PRIVATE HEALTH INSURANCE | Admitting: Physical Therapy

## 2019-07-11 ENCOUNTER — Other Ambulatory Visit: Payer: Self-pay

## 2019-07-11 DIAGNOSIS — M62838 Other muscle spasm: Secondary | ICD-10-CM

## 2019-07-11 DIAGNOSIS — R600 Localized edema: Secondary | ICD-10-CM

## 2019-07-11 DIAGNOSIS — M25511 Pain in right shoulder: Secondary | ICD-10-CM | POA: Diagnosis not present

## 2019-07-11 DIAGNOSIS — M6281 Muscle weakness (generalized): Secondary | ICD-10-CM

## 2019-07-11 DIAGNOSIS — M25611 Stiffness of right shoulder, not elsewhere classified: Secondary | ICD-10-CM

## 2019-07-11 NOTE — Therapy (Signed)
[facility] [address], [address] [place], [place], [address] Phone: [phone]   Fax:  [phone]  Physical Therapy Treatment  Patient Details  Name: Samantha Curtis MRN: [id number] Date of Birth: February 01, 1966 Referring Provider (PT): Marcene Corning, MD   Encounter Date: 07/11/2019  PT End of Session - 07/11/19 1018    Visit Number  4    Date for PT Re-Evaluation  08/01/19    Authorization Type  WC 1 eval and 11 visits    Authorization - Visit Number  4    Authorization - Number of Visits  12    PT Start Time  1018    PT Stop Time  1100    PT Time Calculation (min)  42 min    Activity Tolerance  Patient tolerated treatment well    Behavior During Therapy  [facility] for tasks assessed/performed       Past Medical History:  Diagnosis Date  . Arthritis    "knees" (07/04/2017)  . Dysrhythmia    PALPITATIONS  . GERD (gastroesophageal reflux disease)   . High cholesterol   . Leg pain     Past Surgical History:  Procedure Laterality Date  . ENDOVENOUS ABLATION SAPHENOUS VEIN W/ LASER Left 2005   [facility]  . JOINT REPLACEMENT    . KNEE ARTHROSCOPY Right   . SHOULDER ARTHROSCOPY Right    "spurs"  . SHOULDER ARTHROSCOPY Right 06/24/2019   Procedure: RIGHT SHOULDER ARTHROSCOPY; CHONDROPLASTY AND DEBRIEDMENT;  Surgeon: Marcene Corning, MD;  Location: [facility];  Service: Orthopedics;  Laterality: Right;  . TOTAL KNEE ARTHROPLASTY Bilateral 07/03/2017   Procedure: TOTAL KNEE BILATERAL;  Surgeon: Marcene Corning, MD;  Location: [facility];  Service: Orthopedics;  Laterality: Bilateral;  . TUBAL LIGATION    . VAGINAL HYSTERECTOMY      There were no vitals filed for this visit.  Subjective Assessment - 07/11/19 1209    Subjective  I am feeling okay.  I am doing everything at home and the motion is good.    Pertinent History  B knee replacement    Limitations  Lifting    Patient Stated Goals  improve strength and pain of Rt  shoulder    Currently in Pain?  --   did not comment about any pain                      [facility] Adult PT Treatment/Exercise - 07/11/19 0001      Shoulder Exercises: Supine   Flexion Limitations  chest press and overhead flexion with cane 2x5 each   1.5 lb added     Shoulder Exercises: Standing   External Rotation  Strengthening;Both;20 reps;Theraband    Theraband Level (Shoulder External Rotation)  Level 1 (Yellow)   isometric - side stepping   Internal Rotation  Strengthening;Right;10 reps    Theraband Level (Shoulder Internal Rotation)  Level 1 (Yellow)    Flexion  Strengthening    Theraband Level (Shoulder Flexion)  Level 1 (Yellow)    Flexion Limitations  isometric keeping arms at her side and stepping back    Extension  Strengthening;Right;Theraband;10 reps    Theraband Level (Shoulder Extension)  Level 1 (Yellow)    Extension Limitations  isometric keeping arms at her side and stepping back    Other Standing Exercises  finger ladder 10x - feels easier than last time getting to level 19    Other Standing Exercises  rolling on green ball - flex  and abduction - 10x each way      Shoulder Exercises: Pulleys   Flexion  3 minutes               PT Short Term Goals - 07/11/19 1213      PT SHORT TERM GOAL #1   Title  Pt will be independent with initial HEP to decrease inflammation and increase ROM.    Baseline  reports she is able to do the HEP and demonstrates some improved ROM with less pain    Status  Achieved        PT Long Term Goals - 07/03/19 1638      PT LONG TERM GOAL #1   Title  Pt will have greater than 140 deg of active Rt shoulder flexion.    Time  4    Period  Weeks    Status  New    Target Date  08/01/19      PT LONG TERM GOAL #2   Title  Pt will have atleast 4/5 MMT strength of the Rt shoulder to increase her ability to reach overhead into the kitchen cabinet.    Time  4    Period  Weeks    Status  New      PT LONG TERM GOAL  #3   Title  Pt will report atleast 40% improvement in her pain with sleeping.    Time  4    Period  Weeks    Status  New      PT LONG TERM GOAL #4   Title  Pt will have no more than 38% limitation on FOTO outcome measure    Time  4    Period  Weeks    Status  New            Plan - 07/11/19 1203    Clinical Impression Statement  Pt responded well to isometric strengthening with the band today.  She fatigued towards the end of the session and began feeling more pain during the final exercises supine isometeric horizontal abduction.  pt will benefit from skilled PT to continue to progress strength and stability.  Pt needed some explanation of how it will take time to build up the strength needed to return to full duties at work as she is very anxious to get back at this time.  Pt will benefit from skilled PT to continue working on should ROM and strength.    PT Treatment/Interventions  ADLs/Self Care Home Management;Aquatic Therapy;Electrical Stimulation;Moist Heat;Cryotherapy;Iontophoresis 4mg /ml Dexamethasone;Therapeutic activities;Therapeutic exercise;Neuromuscular re-education;Manual techniques;Dry needling;Passive range of motion;Scar mobilization;Patient/family education;Taping    PT Next Visit Plan  shoulder ROM, isometric strength, scapular strength, pain relief as needed.    PT Home Exercise Plan  [id number]    Consulted and Agree with Plan of Care  Patient       Patient will benefit from skilled therapeutic intervention in order to improve the following deficits and impairments:  Pain, Increased muscle spasms, Decreased mobility, Decreased activity tolerance, Decreased range of motion, Decreased strength, Hypomobility, Impaired flexibility, Increased edema  Visit Diagnosis: Stiffness of right shoulder, not elsewhere classified  Acute pain of right shoulder  Localized edema  Muscle weakness (generalized)  Other muscle spasm     Problem List Patient Active Problem List    Diagnosis Date Noted  . S/P TKR (total knee replacement), bilateral   . Gastroesophageal reflux disease   . Post-operative pain   . Elevated blood pressure reading   . Bilateral  primary osteoarthritis of knee 07/03/2017  . Lateral epicondylitis of right elbow 04/02/2013    Jule Ser, PT 07/11/2019, 12:14 PM  [facility] [address] [place], [place], [address] Phone: [phone]   Fax:  [phone]  Name: JULANNE SCHLUETER MRN: [id number] Date of Birth: 1966/05/25

## 2019-07-14 ENCOUNTER — Encounter: Payer: Self-pay | Admitting: Physical Therapy

## 2019-07-14 ENCOUNTER — Other Ambulatory Visit: Payer: Self-pay

## 2019-07-14 ENCOUNTER — Ambulatory Visit: Payer: PRIVATE HEALTH INSURANCE | Admitting: Physical Therapy

## 2019-07-14 DIAGNOSIS — M25611 Stiffness of right shoulder, not elsewhere classified: Secondary | ICD-10-CM

## 2019-07-14 DIAGNOSIS — M25511 Pain in right shoulder: Secondary | ICD-10-CM | POA: Diagnosis not present

## 2019-07-14 DIAGNOSIS — M6281 Muscle weakness (generalized): Secondary | ICD-10-CM

## 2019-07-14 DIAGNOSIS — M62838 Other muscle spasm: Secondary | ICD-10-CM

## 2019-07-14 DIAGNOSIS — R600 Localized edema: Secondary | ICD-10-CM

## 2019-07-14 NOTE — Therapy (Signed)
[facility] [address], [address] [place], [place], [address] Phone: [phone]   Fax:  [phone]  Physical Therapy Treatment  Patient Details  Name: Samantha Curtis MRN: [id number] Date of Birth: May 04, 1966 Referring Provider (PT): Marcene Corning, MD   Encounter Date: 07/14/2019  PT End of Session - 07/14/19 1136    Visit Number  5    Date for PT Re-Evaluation  08/01/19    Authorization Type  WC 1 eval and 11 visits    Authorization Time Period  07/03/19    Authorization - Visit Number  5    Authorization - Number of Visits  12    PT Start Time  1136    PT Stop Time  1215    PT Time Calculation (min)  39 min    Activity Tolerance  Patient tolerated treatment well    Behavior During Therapy  [facility] for tasks assessed/performed       Past Medical History:  Diagnosis Date  . Arthritis    "knees" (07/04/2017)  . Dysrhythmia    PALPITATIONS  . GERD (gastroesophageal reflux disease)   . High cholesterol   . Leg pain     Past Surgical History:  Procedure Laterality Date  . ENDOVENOUS ABLATION SAPHENOUS VEIN W/ LASER Left 2005   [facility]  . JOINT REPLACEMENT    . KNEE ARTHROSCOPY Right   . SHOULDER ARTHROSCOPY Right    "spurs"  . SHOULDER ARTHROSCOPY Right 06/24/2019   Procedure: RIGHT SHOULDER ARTHROSCOPY; CHONDROPLASTY AND DEBRIEDMENT;  Surgeon: Marcene Corning, MD;  [facility];  Service: Orthopedics;  Laterality: Right;  . TOTAL KNEE ARTHROPLASTY Bilateral 07/03/2017   Procedure: TOTAL KNEE BILATERAL;  Surgeon: Marcene Corning, MD;  Location: [facility];  Service: Orthopedics;  Laterality: Bilateral;  . TUBAL LIGATION    . VAGINAL HYSTERECTOMY      There were no vitals filed for this visit.  Subjective Assessment - 07/14/19 1136    Subjective  I feel like I went backwards over the weekend. Unsure exactly why.....did not sleep well last night.    Currently in Pain?  Yes    Pain Score  7     Pain Location   Shoulder    Pain Orientation  Right    Pain Descriptors / Indicators  Aching   pinches   Pain Onset  1 to 4 weeks ago    Aggravating Factors   Not sure    Pain Relieving Factors  rest    Multiple Pain Sites  No                       [facility] Adult PT Treatment/Exercise - 07/14/19 0001      Moist Heat Therapy   Moist Heat Location  Shoulder      Electrical Stimulation   Electrical Stimulation Location  RT shoulder    Electrical Stimulation Action  Premod A/P Rt shoulder     Electrical Stimulation Parameters  80-150 HZ to tolerance       Iontophoresis   Type of Iontophoresis  Dexamethasone   #1   Location  Anterior Rt shoulder    Dose  1 ml    Time  6 hr wear   Skin intact     Manual Therapy   Manual therapy comments  Attempted PROM and Gd 1 mobs for pain. Pt could not tolerate at al due to pain.  PT Short Term Goals - 07/11/19 1213      PT SHORT TERM GOAL #1   Title  Pt will be independent with initial HEP to decrease inflammation and increase ROM.    Baseline  reports she is able to do the HEP and demonstrates some improved ROM with less pain    Status  Achieved        PT Long Term Goals - 07/03/19 1638      PT LONG TERM GOAL #1   Title  Pt will have greater than 140 deg of active Rt shoulder flexion.    Time  4    Period  Weeks    Status  New    Target Date  08/01/19      PT LONG TERM GOAL #2   Title  Pt will have atleast 4/5 MMT strength of the Rt shoulder to increase her ability to reach overhead into the kitchen cabinet.    Time  4    Period  Weeks    Status  New      PT LONG TERM GOAL #3   Title  Pt will report atleast 40% improvement in her pain with sleeping.    Time  4    Period  Weeks    Status  New      PT LONG TERM GOAL #4   Title  Pt will have no more than 38% limitation on FOTO outcome measure    Time  4    Period  Weeks    Status  New            Plan - 07/14/19 1145    Clinical Impression  Statement  Pt arrives reporting she 'took some backwards steps over the weekend." She reports doing nothing out of the usual and when anything caused pain she would stop or modify the activity to cease the pain. She presents very upset today as she felt prior to the weekend she was making good progress.Pt initially could not tolerate any Active or Passive ROM of the Rt shoulder so we started the session with Estim in hopes of tolerating more of the treatment. Pt received an ionto patch to the anterior RT shoulder at end of session. She did not tolerate any active or passive treatment.    Personal Factors and Comorbidities  Time since onset of injury/illness/exacerbation;Past/Current Experience    Examination-Activity Limitations  Lift;Dressing;Sleep    Stability/Clinical Decision Making  Stable/Uncomplicated    Rehab Potential  Good    PT Frequency  Other (comment)    PT Treatment/Interventions  ADLs/Self Care Home Management;Aquatic Therapy;Electrical Stimulation;Moist Heat;Cryotherapy;Iontophoresis 4mg /ml Dexamethasone;Therapeutic activities;Therapeutic exercise;Neuromuscular re-education;Manual techniques;Dry needling;Passive range of motion;Scar mobilization;Patient/family education;Taping    PT Next Visit Plan  Assess pain, maybe see if pt should conitnue with ionto.    PT Home Exercise Plan  [email or web address]    Consulted and Agree with Plan of Care  Patient       Patient will benefit from skilled therapeutic intervention in order to improve the following deficits and impairments:  Pain, Increased muscle spasms, Decreased mobility, Decreased activity tolerance, Decreased range of motion, Decreased strength, Hypomobility, Impaired flexibility, Increased edema  Visit Diagnosis: Acute pain of right shoulder  Stiffness of right shoulder, not elsewhere classified  Localized edema  Muscle weakness (generalized)  Other muscle spasm     Problem List Patient Active Problem List   Diagnosis Date  Noted  . S/P TKR (total knee replacement), bilateral   . Gastroesophageal reflux  disease   . Post-operative pain   . Elevated blood pressure reading   . Bilateral primary osteoarthritis of knee 07/03/2017  . Lateral epicondylitis of right elbow 04/02/2013    Yesenia Fontenette, PTA 07/14/2019, 12:21 PM  [facility] [address], [address] [place], [place], [address] Phone: [phone]   Fax:  [phone]  Name: Samantha Curtis MRN: [id number] Date of Birth: 1965-11-04

## 2019-07-16 ENCOUNTER — Ambulatory Visit: Payer: PRIVATE HEALTH INSURANCE | Admitting: Physical Therapy

## 2019-07-16 ENCOUNTER — Encounter: Payer: Self-pay | Admitting: Physical Therapy

## 2019-07-16 ENCOUNTER — Other Ambulatory Visit: Payer: Self-pay

## 2019-07-16 DIAGNOSIS — M25511 Pain in right shoulder: Secondary | ICD-10-CM | POA: Diagnosis not present

## 2019-07-16 DIAGNOSIS — M62838 Other muscle spasm: Secondary | ICD-10-CM

## 2019-07-16 DIAGNOSIS — M6281 Muscle weakness (generalized): Secondary | ICD-10-CM

## 2019-07-16 DIAGNOSIS — R600 Localized edema: Secondary | ICD-10-CM

## 2019-07-16 DIAGNOSIS — M25611 Stiffness of right shoulder, not elsewhere classified: Secondary | ICD-10-CM

## 2019-07-16 NOTE — Therapy (Signed)
[facility] [address], [address] [place], [place], [address] Phone: [phone]   Fax:  [phone]  Physical Therapy Treatment  Patient Details  Name: Samantha Curtis MRN: [id number] Date of Birth: 1966-02-13 Referring Provider (PT): Marcene Corning, MD   Encounter Date: 07/16/2019  PT End of Session - 07/16/19 1318    Visit Number  6    Date for PT Re-Evaluation  08/01/19    Authorization Type  WC 1 eval and 11 visits    Authorization Time Period  07/03/19    Authorization - Visit Number  6    Authorization - Number of Visits  12    PT Start Time  1225    PT Stop Time  1312    PT Time Calculation (min)  47 min    Activity Tolerance  Patient limited by pain    Behavior During Therapy  [facility] for tasks assessed/performed       Past Medical History:  Diagnosis Date  . Arthritis    "knees" (07/04/2017)  . Dysrhythmia    PALPITATIONS  . GERD (gastroesophageal reflux disease)   . High cholesterol   . Leg pain     Past Surgical History:  Procedure Laterality Date  . ENDOVENOUS ABLATION SAPHENOUS VEIN W/ LASER Left 2005   [facility]  . JOINT REPLACEMENT    . KNEE ARTHROSCOPY Right   . SHOULDER ARTHROSCOPY Right    "spurs"  . SHOULDER ARTHROSCOPY Right 06/24/2019   Procedure: RIGHT SHOULDER ARTHROSCOPY; CHONDROPLASTY AND DEBRIEDMENT;  Surgeon: Marcene Corning, MD;  [facility]: [facility];  Service: Orthopedics;  Laterality: Right;  . TOTAL KNEE ARTHROPLASTY Bilateral 07/03/2017   Procedure: TOTAL KNEE BILATERAL;  Surgeon: Marcene Corning, MD;  Location: [facility];  Service: Orthopedics;  Laterality: Bilateral;  . TUBAL LIGATION    . VAGINAL HYSTERECTOMY      There were no vitals filed for this visit.  Subjective Assessment - 07/16/19 1227    Subjective  Pt states that her arm is still very sore. She has been trying to practice moving her arm in the directions she will move it when she returns to work. Still has pain at night  and is unable to sleep more than 2 hours at a time due to a combination of issues. HEP does not seem to bother it except for the band.    Currently in Pain?  Yes    Pain Score  6     Pain Location  Shoulder    Pain Orientation  Right;Lateral    Pain Descriptors / Indicators  Aching;Sore    Pain Type  Acute pain    Pain Radiating Towards  none at this time    Pain Onset  1 to 4 weeks ago    Pain Frequency  Constant    Aggravating Factors   using the arm, reaching out to the side, sleep    Pain Relieving Factors  taking tylenol some                       [facility] Adult PT Treatment/Exercise - 07/16/19 0001      Shoulder Exercises: Supine   Flexion  AAROM;Right x10 reps   Flexion Limitations  elbows bent for lowering back down to decrease pain       Shoulder Exercises: Standing   Flexion  AAROM;Right;10 reps    Flexion Limitations  encouraged to avoid pain end range; x10 reps scaption Rt UE  Other Standing Exercises  self trigger point release posterior Rt shoulder with tennis ball against wall       Shoulder Exercises: Isometric Strengthening   Flexion  5X5"    Flexion Limitations  25%: standing, Rt UE verbal cues to prevent body lean    Internal Rotation  5X5"    Internal Rotation Limitations  standing, Rt UE    ABduction  5X5"    ABduction Limitations  25%: standing, Rt UE verbal cues to prevent body lean      Manual Therapy   Manual therapy comments  gentle STM Lt posterior shoulder: teres/latissimus             PT Education - 07/16/19 1314    Education Details  adjustments to HEP    Person(s) Educated  Patient    Methods  Explanation;Verbal cues;Handout    Comprehension  Verbalized understanding;Returned demonstration       PT Short Term Goals - 07/11/19 1213      PT SHORT TERM GOAL #1   Title  Pt will be independent with initial HEP to decrease inflammation and increase ROM.    Baseline  reports she is able to do the HEP and demonstrates some  improved ROM with less pain    Status  Achieved        PT Long Term Goals - 07/03/19 1638      PT LONG TERM GOAL #1   Title  Pt will have greater than 140 deg of active Rt shoulder flexion.    Time  4    Period  Weeks    Status  New    Target Date  08/01/19      PT LONG TERM GOAL #2   Title  Pt will have atleast 4/5 MMT strength of the Rt shoulder to increase her ability to reach overhead into the kitchen cabinet.    Time  4    Period  Weeks    Status  New      PT LONG TERM GOAL #3   Title  Pt will report atleast 40% improvement in her pain with sleeping.    Time  4    Period  Weeks    Status  New      PT LONG TERM GOAL #4   Title  Pt will have no more than 38% limitation on FOTO outcome measure    Time  4    Period  Weeks    Status  New            Plan - 07/16/19 1319    Clinical Impression Statement  Pt continues to report high levels of Rt shoulder pain and discomfort. PT spent some time reviewing sleeping positions to avoid aggravating the shoulder as well as encouraging pt to focus on HEP only rather than attempting other shoulder movements/exercises. Pt's HEP was updated and she was able to complete active assisted shoulder flexion and scaption with mild end range discomfort. Pt continues to have guarded Rt shoulder, holding it tight against her body throughout the session. As a result, she has trigger points and muscle spasm of the posterior shoulder. PT completed gentle soft tissue mobilization to the area and educated pt on ways to work on this at home moving forward.    Personal Factors and Comorbidities  Time since onset of injury/illness/exacerbation;Past/Current Experience    Examination-Activity Limitations  Lift;Dressing;Sleep    Stability/Clinical Decision Making  Stable/Uncomplicated    Rehab Potential  Good  PT Frequency  Other (comment)    PT Treatment/Interventions  ADLs/Self Care Home Management;Aquatic Therapy;Electrical Stimulation;Moist  Heat;Cryotherapy;Iontophoresis 4mg /ml Dexamethasone;Therapeutic activities;Therapeutic exercise;Neuromuscular re-education;Manual techniques;Dry needling;Passive range of motion;Scar mobilization;Patient/family education;Taping    PT Next Visit Plan  isometrics manually; gentle AAROM/seated ranger; scap strength as able    PT Home Exercise Plan  [email or web address]    Consulted and Agree with Plan of Care  Patient       Patient will benefit from skilled therapeutic intervention in order to improve the following deficits and impairments:  Pain, Increased muscle spasms, Decreased mobility, Decreased activity tolerance, Decreased range of motion, Decreased strength, Hypomobility, Impaired flexibility, Increased edema  Visit Diagnosis: Acute pain of right shoulder  Stiffness of right shoulder, not elsewhere classified  Localized edema  Muscle weakness (generalized)  Other muscle spasm     Problem List Patient Active Problem List   Diagnosis Date Noted  . S/P TKR (total knee replacement), bilateral   . Gastroesophageal reflux disease   . Post-operative pain   . Elevated blood pressure reading   . Bilateral primary osteoarthritis of knee 07/03/2017  . Lateral epicondylitis of right elbow 04/02/2013   1:23 PM,07/16/19 07/18/19 PT, DPT [facility]  [phone]  [facility] [address], [address] [place], [place], [place] Phone: [phone]   Fax:  [phone]  Name: Samantha Curtis MRN: [name] Date of Birth: 1966/02/23

## 2019-07-16 NOTE — Patient Instructions (Signed)
Access Code: [email or web address]  URL: [email or web address]  Date: 07/16/2019  Prepared by: Donita Brooks    Exercises Standing Backward Shoulder Rolls- 10-15 reps- 1x daily- 7x weekly  Seated Scapular Retraction- 10 reps- 3 sets- 2x daily- 7x weekly  Horizontal Shoulder Pendulum with Table Support- 20 reps- 1 sets- 2x daily- 7x weekly  Standing Horizontal Shoulder Pendulum Supported with Arm Bent- 20 reps- 1 sets- 2x daily- 7x weekly  Supine Shoulder Flexion Extension AAROM with Dowel- 10 reps- 1 sets- 2x daily- 7x weekly  Seated Shoulder Flexion Slide at Table Top with Forearm in Neutral- 10 reps- 1 sets- 2x daily- 7x weekly  Seated Shoulder Scaption Slide at Table Top with Forearm in Neutral- 10 reps- 1 sets- 2x daily- 7x weekly    [facility] [address], [address] [place], [place] [address] Phone # [phone] Fax [phone]

## 2019-07-17 ENCOUNTER — Encounter: Payer: Self-pay | Admitting: Physical Therapy

## 2019-07-17 ENCOUNTER — Other Ambulatory Visit: Payer: Self-pay

## 2019-07-17 ENCOUNTER — Ambulatory Visit: Payer: PRIVATE HEALTH INSURANCE | Admitting: Physical Therapy

## 2019-07-17 DIAGNOSIS — M25511 Pain in right shoulder: Secondary | ICD-10-CM

## 2019-07-17 DIAGNOSIS — R600 Localized edema: Secondary | ICD-10-CM

## 2019-07-17 DIAGNOSIS — M6281 Muscle weakness (generalized): Secondary | ICD-10-CM

## 2019-07-17 DIAGNOSIS — M25611 Stiffness of right shoulder, not elsewhere classified: Secondary | ICD-10-CM

## 2019-07-17 DIAGNOSIS — M62838 Other muscle spasm: Secondary | ICD-10-CM

## 2019-07-17 NOTE — Therapy (Signed)
[facility] [address] [place], [place], [address] Phone: [phone]   Fax:  [phone]  Physical Therapy Treatment  Patient Details  Name: Samantha Curtis MRN: [id number] Date of Birth: [birth year] Referring Provider (PT): Marcene Corning, MD   Encounter Date: 07/17/2019  PT End of Session - 07/17/19 1714    Visit Number  7    Date for PT Re-Evaluation  08/01/19    Authorization Type  [facility] 1 eval and 11 visits    Authorization Time Period  07/03/19    Authorization - Visit Number  7    Authorization - Number of Visits  12    PT Start Time  1531    PT Stop Time  1614    PT Time Calculation (min)  43 min    Activity Tolerance  No increased pain    Behavior During Therapy  [facility] for tasks assessed/performed       Past Medical History:  Diagnosis Date  . Arthritis    "knees" (07/04/2017)  . Dysrhythmia    PALPITATIONS  . GERD (gastroesophageal reflux disease)   . High cholesterol   . Leg pain     Past Surgical History:  Procedure Laterality Date  . ENDOVENOUS ABLATION SAPHENOUS VEIN W/ LASER Left 2005   [facility]  . JOINT REPLACEMENT    . KNEE ARTHROSCOPY Right   . SHOULDER ARTHROSCOPY Right    "spurs"  . SHOULDER ARTHROSCOPY Right 06/24/2019   Procedure: RIGHT SHOULDER ARTHROSCOPY; CHONDROPLASTY AND DEBRIEDMENT;  Surgeon: Marcene Corning, MD;  Location: [facility];  Service: Orthopedics;  Laterality: Right;  . TOTAL KNEE ARTHROPLASTY Bilateral 07/03/2017   Procedure: TOTAL KNEE BILATERAL;  Surgeon: Marcene Corning, MD;  Location: [facility];  Service: Orthopedics;  Laterality: Bilateral;  . TUBAL LIGATION    . VAGINAL HYSTERECTOMY      There were no vitals filed for this visit.  Subjective Assessment - 07/17/19 1534    Subjective  Pt reports that her arm is feeling much better today. She did her HEP without any issues.    Pain Score  4     Pain Location  Shoulder    Pain Orientation  Right;Lateral     Pain Descriptors / Indicators  Aching;Sore    Pain Type  Surgical pain    Pain Radiating Towards  none    Pain Onset  1 to 4 weeks ago    Pain Frequency  Constant    Aggravating Factors   lifting the arm, using the Rt arm throughout the day    Pain Relieving Factors  unsure         [facility] PT Assessment - 07/17/19 0001      AROM   Right Shoulder Flexion  120 Degrees    Right Shoulder ABduction  110 Degrees                   [facility] Adult PT Treatment/Exercise - 07/17/19 0001      Shoulder Exercises: Supine   Other Supine Exercises  Rt shoulder ER/IR/extension/abduction isometric 25% PT manual resistance 5x5 sec hold       Shoulder Exercises: Seated   Protraction  AAROM;Right;15 reps    Protraction Limitations  UE resting on ranger     Flexion  AAROM;Right;20 reps    Flexion Limitations  UE ranger on floor       Shoulder Exercises: Standing   Other Standing Exercises  bicep curls x10 reps yellow TB;  tricep extension yellow TB 2x10 reps       Shoulder Exercises: Pulleys   Flexion  3 minutes    Flexion Limitations  Rt    Scaption  3 minutes    Scaption Limitations  Rt      Iontophoresis   Type of Iontophoresis  Dexamethasone    Location  Anterior Rt shoulder    Dose  1 ml    Time  6 hr wear      Manual Therapy   Manual therapy comments  Trigger point release Rt teres major/minor/latissimus with gentle passive shoulder elevation to 80 deg              PT Education - 07/17/19 1712    Education Details  technique with therex    Person(s) Educated  Patient    Methods  Explanation;Verbal cues;Tactile cues    Comprehension  Verbalized understanding;Returned demonstration       PT Short Term Goals - 07/11/19 1213      PT SHORT TERM GOAL #1   Title  Pt will be independent with initial HEP to decrease inflammation and increase ROM.    Baseline  reports she is able to do the HEP and demonstrates some improved ROM with less pain    Status  Achieved         PT Long Term Goals - 07/03/19 1638      PT LONG TERM GOAL #1   Title  Pt will have greater than 140 deg of active Rt shoulder flexion.    Time  4    Period  Weeks    Status  New    Target Date  08/01/19      PT LONG TERM GOAL #2   Title  Pt will have atleast 4/5 MMT strength of the Rt shoulder to increase her ability to reach overhead into the kitchen cabinet.    Time  4    Period  Weeks    Status  New      PT LONG TERM GOAL #3   Title  Pt will report atleast 40% improvement in her pain with sleeping.    Time  4    Period  Weeks    Status  New      PT LONG TERM GOAL #4   Title  Pt will have no more than 38% limitation on FOTO outcome measure    Time  4    Period  Weeks    Status  New            Plan - 07/17/19 1715    Clinical Impression Statement  Pt is making gradual progress towards her goals. She had a setback last week without known cause, reporting increased Rt shoulder pain throughout her day and during the night. Following yesterday's HEP adjustments and thorough education on activity limitation, she appears to be doing much better today. Pt was able to complete gentle shoulder isometrics with PT feedback for pain free muscle activation. Pt's shoulder abduction active ROM has increased to 110 degrees from 80 degrees at the evaluation. She continues to have significant weakness with shoulder elevation and would continue to benefit from skilled PT intervention and feedback to facilitate her safe return to work.    Personal Factors and Comorbidities  Time since onset of injury/illness/exacerbation;Past/Current Experience    Examination-Activity Limitations  Lift;Dressing;Sleep    Stability/Clinical Decision Making  Stable/Uncomplicated    Rehab Potential  Good    PT Frequency  Other (  comment)    PT Treatment/Interventions  ADLs/Self Care Home Management;Aquatic Therapy;Electrical Stimulation;Moist Heat;Cryotherapy;Iontophoresis 4mg /ml Dexamethasone;Therapeutic  activities;Therapeutic exercise;Neuromuscular re-education;Manual techniques;Dry needling;Passive range of motion;Scar mobilization;Patient/family education;Taping    PT Next Visit Plan  isometrics manually; reclined-gentle AAROM/seated ranger; scap strength as able    PT Home Exercise Plan  [email or web address]    Consulted and Agree with Plan of Care  Patient       Patient will benefit from skilled therapeutic intervention in order to improve the following deficits and impairments:  Pain, Increased muscle spasms, Decreased mobility, Decreased activity tolerance, Decreased range of motion, Decreased strength, Hypomobility, Impaired flexibility, Increased edema  Visit Diagnosis: Acute pain of right shoulder  Stiffness of right shoulder, not elsewhere classified  Localized edema  Muscle weakness (generalized)  Other muscle spasm     Problem List Patient Active Problem List   Diagnosis Date Noted  . S/P TKR (total knee replacement), bilateral   . Gastroesophageal reflux disease   . Post-operative pain   . Elevated blood pressure reading   . Bilateral primary osteoarthritis of knee 07/03/2017  . Lateral epicondylitis of right elbow 04/02/2013    5:20 PM,07/17/19 Sherol Dade PT, DPT [facility] [address], [place], [place], [address] Phone: [phone]   Fax:  [phone]  Name: Samantha Curtis MRN: [id number] Date of Birth: 10-Apr-1966

## 2019-07-21 ENCOUNTER — Encounter: Payer: Self-pay | Admitting: Physical Therapy

## 2019-07-21 ENCOUNTER — Ambulatory Visit: Payer: PRIVATE HEALTH INSURANCE | Attending: Orthopaedic Surgery | Admitting: Physical Therapy

## 2019-07-21 ENCOUNTER — Other Ambulatory Visit: Payer: Self-pay

## 2019-07-21 DIAGNOSIS — R600 Localized edema: Secondary | ICD-10-CM | POA: Diagnosis present

## 2019-07-21 DIAGNOSIS — M25511 Pain in right shoulder: Secondary | ICD-10-CM | POA: Insufficient documentation

## 2019-07-21 DIAGNOSIS — M6281 Muscle weakness (generalized): Secondary | ICD-10-CM | POA: Diagnosis present

## 2019-07-21 DIAGNOSIS — M25611 Stiffness of right shoulder, not elsewhere classified: Secondary | ICD-10-CM | POA: Diagnosis present

## 2019-07-21 DIAGNOSIS — M62838 Other muscle spasm: Secondary | ICD-10-CM | POA: Insufficient documentation

## 2019-07-21 NOTE — Therapy (Signed)
[facility] [address] [place], [place], [address] Phone: [phone]   Fax:  [phone]  Physical Therapy Treatment  Patient Details  Name: Samantha Curtis MRN: [id number] Date of Birth: 06/04/1966 Referring Provider (PT): Marcene Corning, MD   Encounter Date: 07/21/2019  PT End of Session - 07/21/19 1447    Visit Number  8    Date for PT Re-Evaluation  08/01/19    Authorization Time Period  07/03/19    Authorization - Visit Number  1    Authorization - Number of Visits  12    PT Start Time  1445    PT Stop Time  1526    PT Time Calculation (min)  41 min    Activity Tolerance  Patient tolerated treatment well    Behavior During Therapy  [facility] for tasks assessed/performed       Past Medical History:  Diagnosis Date  . Arthritis    "knees" (07/04/2017)  . Dysrhythmia    PALPITATIONS  . GERD (gastroesophageal reflux disease)   . High cholesterol   . Leg pain     Past Surgical History:  Procedure Laterality Date  . ENDOVENOUS ABLATION SAPHENOUS VEIN W/ LASER Left 2005   [facility]  . JOINT REPLACEMENT    . KNEE ARTHROSCOPY Right   . SHOULDER ARTHROSCOPY Right    "spurs"  . SHOULDER ARTHROSCOPY Right 06/24/2019   Procedure: RIGHT SHOULDER ARTHROSCOPY; CHONDROPLASTY AND DEBRIEDMENT;  Surgeon: Marcene Corning, MD;  [facility];  Service: Orthopedics;  Laterality: Right;  . TOTAL KNEE ARTHROPLASTY Bilateral 07/03/2017   Procedure: TOTAL KNEE BILATERAL;  Surgeon: Marcene Corning, MD;  [facility];  Service: Orthopedics;  Laterality: Bilateral;  . TUBAL LIGATION    . VAGINAL HYSTERECTOMY      There were no vitals filed for this visit.  Subjective Assessment - 07/21/19 1449    Subjective  Today went back to work, light duty. Did ok, my neck is spasming now.    Pertinent History  B knee replacement    Currently in Pain?  Yes   Neck and RT shoulder   Pain Score  7     Pain Location  Neck    Pain  Orientation  Right    Pain Descriptors / Indicators  Throbbing;Tightness    Multiple Pain Sites  No                       [facility] Adult PT Treatment/Exercise - 07/21/19 0001      Shoulder Exercises: Seated   Flexion Limitations  UE ranger on floor    10x in each plane     Shoulder Exercises: Standing   Flexion  AAROM;Right;10 reps   10x on a diagonal   Shoulder Flexion Weight (lbs)  Table slides      Iontophoresis   Type of Iontophoresis  Dexamethasone   #3   Location  Anterior Rt shoulder    Dose  1 ml    Time  6 hr wear   skin intact     Manual Therapy   Manual therapy comments  TP release RT scalenes, upper traps passive stretching to RT upper trap.     Other Manual Therapy  Manually resisted isometrics in all planes 5 sec x10, VC to push lighter in ER due to pain.                PT Short Term Goals - 07/11/19  1213      PT SHORT TERM GOAL #1   Title  Pt will be independent with initial HEP to decrease inflammation and increase ROM.    Baseline  reports she is able to do the HEP and demonstrates some improved ROM with less pain    Status  Achieved        PT Long Term Goals - 07/03/19 1638      PT LONG TERM GOAL #1   Title  Pt will have greater than 140 deg of active Rt shoulder flexion.    Time  4    Period  Weeks    Status  New    Target Date  08/01/19      PT LONG TERM GOAL #2   Title  Pt will have atleast 4/5 MMT strength of the Rt shoulder to increase her ability to reach overhead into the kitchen cabinet.    Time  4    Period  Weeks    Status  New      PT LONG TERM GOAL #3   Title  Pt will report atleast 40% improvement in her pain with sleeping.    Time  4    Period  Weeks    Status  New      PT LONG TERM GOAL #4   Title  Pt will have no more than 38% limitation on FOTO outcome measure    Time  4    Period  Weeks    Status  New            Plan - 07/21/19 1451    Clinical Impression Statement  Pt worked for the  first time today, light duty. She reports doing ok, mostly spasms in her RT upper traps. Manually worked on deactivating the upper traps and associated TPs during treatment. Pt tolerating the AAROM table exs very well, no pain. Ionto patch #3 administered today.    Personal Factors and Comorbidities  Time since onset of injury/illness/exacerbation;Past/Current Experience    Examination-Activity Limitations  Lift;Dressing;Sleep    Stability/Clinical Decision Making  Stable/Uncomplicated    Rehab Potential  Good    PT Frequency  Other (comment)    PT Duration  4 weeks    PT Treatment/Interventions  ADLs/Self Care Home Management;Aquatic Therapy;Electrical Stimulation;Moist Heat;Cryotherapy;Iontophoresis 4mg /ml Dexamethasone;Therapeutic activities;Therapeutic exercise;Neuromuscular re-education;Manual techniques;Dry needling;Passive range of motion;Scar mobilization;Patient/family education;Taping    PT Next Visit Plan  isometrics manually; reclined-gentle AAROM/seated ranger; scap strength as able    PT Home Exercise Plan  [email or web address]    Consulted and Agree with Plan of Care  Patient       Patient will benefit from skilled therapeutic intervention in order to improve the following deficits and impairments:  Pain, Increased muscle spasms, Decreased mobility, Decreased activity tolerance, Decreased range of motion, Decreased strength, Hypomobility, Impaired flexibility, Increased edema  Visit Diagnosis: Acute pain of right shoulder  Stiffness of right shoulder, not elsewhere classified  Localized edema  Muscle weakness (generalized)  Other muscle spasm     Problem List Patient Active Problem List   Diagnosis Date Noted  . S/P TKR (total knee replacement), bilateral   . Gastroesophageal reflux disease   . Post-operative pain   . Elevated blood pressure reading   . Bilateral primary osteoarthritis of knee 07/03/2017  . Lateral epicondylitis of right elbow 04/02/2013     Kellsie Grindle, PTA 07/21/2019, 3:27 PM  [facility] [address], [place], [place], [address] Phone:  [phone]   Fax:  [phone]  Name: LITTIE CHIEM MRN: [id number] Date of Birth: 12/24/1965

## 2019-07-23 ENCOUNTER — Ambulatory Visit: Payer: PRIVATE HEALTH INSURANCE | Admitting: Physical Therapy

## 2019-07-23 ENCOUNTER — Other Ambulatory Visit: Payer: Self-pay

## 2019-07-23 ENCOUNTER — Encounter: Payer: Self-pay | Admitting: Physical Therapy

## 2019-07-23 DIAGNOSIS — M25511 Pain in right shoulder: Secondary | ICD-10-CM | POA: Diagnosis not present

## 2019-07-23 DIAGNOSIS — R600 Localized edema: Secondary | ICD-10-CM

## 2019-07-23 DIAGNOSIS — M6281 Muscle weakness (generalized): Secondary | ICD-10-CM

## 2019-07-23 DIAGNOSIS — M25611 Stiffness of right shoulder, not elsewhere classified: Secondary | ICD-10-CM

## 2019-07-23 NOTE — Therapy (Signed)
[facility] [address] [place], [place], [address] Phone: [phone]   Fax:  [phone]  Physical Therapy Treatment  Patient Details  Name: DENEAN PAVON MRN: [id number] Date of Birth: [birth year] Referring Provider (PT): Marcene Corning, MD   Encounter Date: 07/23/2019  PT End of Session - 07/23/19 1448    Visit Number  9    Date for PT Re-Evaluation  08/01/19    Authorization Type  [facility] 1 eval and 11 visits    Authorization Time Period  07/03/19    Authorization - Visit Number  2    Authorization - Number of Visits  12    PT Start Time  1444    PT Stop Time  1531    PT Time Calculation (min)  47 min    Activity Tolerance  Patient tolerated treatment well    Behavior During Therapy  [facility] for tasks assessed/performed       Past Medical History:  Diagnosis Date  . Arthritis    "knees" (07/04/2017)  . Dysrhythmia    PALPITATIONS  . GERD (gastroesophageal reflux disease)   . High cholesterol   . Leg pain     Past Surgical History:  Procedure Laterality Date  . ENDOVENOUS ABLATION SAPHENOUS VEIN W/ LASER Left 2005   [facility]  . JOINT REPLACEMENT    . KNEE ARTHROSCOPY Right   . SHOULDER ARTHROSCOPY Right    "spurs"  . SHOULDER ARTHROSCOPY Right 06/24/2019   Procedure: RIGHT SHOULDER ARTHROSCOPY; CHONDROPLASTY AND DEBRIEDMENT;  Surgeon: Marcene Corning, MD;  [facility];  Service: Orthopedics;  Laterality: Right;  . TOTAL KNEE ARTHROPLASTY Bilateral 07/03/2017   Procedure: TOTAL KNEE BILATERAL;  Surgeon: Marcene Corning, MD;  [facility]: [facility];  Service: Orthopedics;  Laterality: Bilateral;  . TUBAL LIGATION    . VAGINAL HYSTERECTOMY      There were no vitals filed for this visit.  Subjective Assessment - 07/23/19 1450    Subjective  Today at work was better, I am trying to be very aware of keeping my upper trap relaxed.    Pertinent History  B knee replacement    Currently in Pain?  Yes     Pain Score  3     Pain Location  Shoulder    Pain Orientation  Right    Pain Descriptors / Indicators  Dull;Sore    Multiple Pain Sites  No                       [facility] Adult PT Treatment/Exercise - 07/23/19 0001      Shoulder Exercises: Seated   Flexion Limitations  UE ranger on floor    10x2 in each plane     Shoulder Exercises: Prone   Extension  AROM;Strengthening;Right;20 reps   Added to HEP     Shoulder Exercises: Sidelying   External Rotation  AROM;Strengthening;Right;20 reps   added to HEP     Iontophoresis   Type of Iontophoresis  Dexamethasone   #4   Location  Anterior Rt shoulder    Dose  1 ml    Time  6 hr wear   skin intact     Manual Therapy   Manual therapy comments  TP release medial scap boarder, Upper trap, Myofascial stretch Rt shoulder/upper arm    Other Manual Therapy  Rythmic Stab at 90 degrees: flex/ext  3x5  PT Short Term Goals - 07/11/19 1213      PT SHORT TERM GOAL #1   Title  Pt will be independent with initial HEP to decrease inflammation and increase ROM.    Baseline  reports she is able to do the HEP and demonstrates some improved ROM with less pain    Status  Achieved        PT Long Term Goals - 07/03/19 1638      PT LONG TERM GOAL #1   Title  Pt will have greater than 140 deg of active Rt shoulder flexion.    Time  4    Period  Weeks    Status  New    Target Date  08/01/19      PT LONG TERM GOAL #2   Title  Pt will have atleast 4/5 MMT strength of the Rt shoulder to increase her ability to reach overhead into the kitchen cabinet.    Time  4    Period  Weeks    Status  New      PT LONG TERM GOAL #3   Title  Pt will report atleast 40% improvement in her pain with sleeping.    Time  4    Period  Weeks    Status  New      PT LONG TERM GOAL #4   Title  Pt will have no more than 38% limitation on FOTO outcome measure    Time  4    Period  Weeks    Status  New            Plan -  07/23/19 1448    Clinical Impression Statement  Pt reporting work was "better" today, less pain. Upper traps and all along medial scap boarder active TPs giving pt radiating pain. Pt could not lay supine and straighten her elbow without heavy scap protraction. Added to HEP: sidelying ER and prone shoulder extension. Pt had no pain with eitther exercise.    Personal Factors and Comorbidities  Time since onset of injury/illness/exacerbation;Past/Current Experience    Examination-Activity Limitations  Lift;Dressing;Sleep    Stability/Clinical Decision Making  Stable/Uncomplicated    Rehab Potential  Good    PT Frequency  Other (comment)    PT Duration  4 weeks    PT Treatment/Interventions  ADLs/Self Care Home Management;Aquatic Therapy;Electrical Stimulation;Moist Heat;Cryotherapy;Iontophoresis 4mg /ml Dexamethasone;Therapeutic activities;Therapeutic exercise;Neuromuscular re-education;Manual techniques;Dry needling;Passive range of motion;Scar mobilization;Patient/family education;Taping    PT Next Visit Plan  isometrics manually; reclined-gentle AAROM/seated ranger; scap strength as able, consider dry needling to Tps at scapula if seen by PT.    PT Home Exercise Plan  [email or web address]    Consulted and Agree with Plan of Care  Patient       Patient will benefit from skilled therapeutic intervention in order to improve the following deficits and impairments:  Pain, Increased muscle spasms, Decreased mobility, Decreased activity tolerance, Decreased range of motion, Decreased strength, Hypomobility, Impaired flexibility, Increased edema  Visit Diagnosis: Acute pain of right shoulder  Stiffness of right shoulder, not elsewhere classified  Localized edema  Muscle weakness (generalized)     Problem List Patient Active Problem List   Diagnosis Date Noted  . S/P TKR (total knee replacement), bilateral   . Gastroesophageal reflux disease   . Post-operative pain   . Elevated blood pressure reading    . Bilateral primary osteoarthritis of knee 07/03/2017  . Lateral epicondylitis of right elbow 04/02/2013    Lukis Bunt, PTA 07/23/2019, 5:01 PM  [facility] [address] [place], [place], [address] Phone: [phone]   Fax:  [phone]  Name: KORAL THADEN MRN: [id number] Date of Birth: 28-Jul-1965  Access Code: [email or web address]  URL: [email or web address]  Date: 07/23/2019  Prepared by: Ane Payment   Exercises  Standing Backward Shoulder Rolls - 10-15 reps - 1x daily - 7x weekly  Seated Scapular Retraction - 10 reps - 3 sets - 2x daily - 7x weekly  Horizontal Shoulder Pendulum with Table Support - 20 reps - 1 sets - 2x daily - 7x weekly  Standing Horizontal Shoulder Pendulum Supported with Arm Bent - 20 reps - 1 sets - 2x daily - 7x weekly  Supine Shoulder Flexion Extension AAROM with Dowel - 10 reps - 1 sets - 2x daily - 7x weekly  Seated Shoulder Flexion Slide at Table Top with Forearm in Neutral - 10 reps - 1 sets - 2x daily - 7x weekly  Seated Shoulder Scaption Slide at Table Top with Forearm in Neutral - 10 reps - 1 sets - 2x daily - 7x weekly  Sidelying Shoulder External Rotation - 10 reps - 2 sets - 2x daily - 7x weekly  Prone Shoulder Extension - Single Arm - 10 reps - 2 sets - 1x daily - 7x weekly

## 2019-07-24 ENCOUNTER — Other Ambulatory Visit: Payer: Self-pay

## 2019-07-24 ENCOUNTER — Ambulatory Visit: Payer: PRIVATE HEALTH INSURANCE | Admitting: Physical Therapy

## 2019-07-24 ENCOUNTER — Encounter: Payer: Self-pay | Admitting: Physical Therapy

## 2019-07-24 DIAGNOSIS — M62838 Other muscle spasm: Secondary | ICD-10-CM

## 2019-07-24 DIAGNOSIS — M25511 Pain in right shoulder: Secondary | ICD-10-CM

## 2019-07-24 DIAGNOSIS — R600 Localized edema: Secondary | ICD-10-CM

## 2019-07-24 DIAGNOSIS — M6281 Muscle weakness (generalized): Secondary | ICD-10-CM

## 2019-07-24 DIAGNOSIS — M25611 Stiffness of right shoulder, not elsewhere classified: Secondary | ICD-10-CM

## 2019-07-24 NOTE — Patient Instructions (Signed)
Access Code: [email or web address]  URL: [email or web address]  Date: 07/24/2019  Prepared by: Donita Brooks   Exercises  Standing Backward Shoulder Rolls - 10-15 reps - 1x daily - 7x weekly  Seated Scapular Retraction - 10 reps - 3 sets - 2x daily - 7x weekly  Horizontal Shoulder Pendulum with Table Support - 20 reps - 1 sets - 2x daily - 7x weekly  Standing Horizontal Shoulder Pendulum Supported with Arm Bent - 20 reps - 1 sets - 2x daily - 7x weekly  Supine Shoulder Flexion Extension AAROM with Dowel - 20 reps - 1 sets - 2x daily - 7x weekly  Seated Shoulder Flexion Slide at Table Top with Forearm in Neutral - 20 reps - 1 sets - 2x daily - 7x weekly  Seated Shoulder Scaption Slide at Table Top with Forearm in Neutral - 20 reps - 1 sets - 2x daily - 7x weekly  Sidelying Shoulder External Rotation - 10 reps - 2 sets - 2x daily - 7x weekly  Prone Shoulder Extension - Single Arm - 10 reps - 2 sets - 1x daily - 7x weekly    [facility] [address] [place], [place] [address] Phone # [phone] Fax [phone]

## 2019-07-24 NOTE — Therapy (Signed)
[facility] [address], [place], [place], [address] Phone: [phone]   Fax:  [phone]  Physical Therapy Treatment  Patient Details  Name: SARON VANORMAN MRN: [id number] Date of Birth: [birth year] Referring Provider (PT): Melrose Nakayama, MD   Encounter Date: 07/24/2019  PT End of Session - 07/24/19 1443    Visit Number  10    Date for PT Re-Evaluation  08/01/19    Authorization Type  [facility] 1 eval and 11 visits    Authorization Time Period  07/03/19    Authorization - Visit Number  3    Authorization - Number of Visits  12    PT Start Time  1400    PT Stop Time  3546    PT Time Calculation (min)  42 min    Activity Tolerance  Patient limited by pain    Behavior During Therapy  [facility] for tasks assessed/performed       Past Medical History:  Diagnosis Date  . Arthritis    "knees" (07/04/2017)  . Dysrhythmia    PALPITATIONS  . GERD (gastroesophageal reflux disease)   . High cholesterol   . Leg pain     Past Surgical History:  Procedure Laterality Date  . ENDOVENOUS ABLATION SAPHENOUS VEIN W/ LASER Left 2005   [facility]  . JOINT REPLACEMENT    . KNEE ARTHROSCOPY Right   . SHOULDER ARTHROSCOPY Right    "spurs"  . SHOULDER ARTHROSCOPY Right 06/24/2019   Procedure: RIGHT SHOULDER ARTHROSCOPY; CHONDROPLASTY AND DEBRIEDMENT;  Surgeon: Melrose Nakayama, MD;  Location: [facility];  Service: Orthopedics;  Laterality: Right;  . TOTAL KNEE ARTHROPLASTY Bilateral 07/03/2017   Procedure: TOTAL KNEE BILATERAL;  Surgeon: Melrose Nakayama, MD;  Location: [place];  Service: Orthopedics;  Laterality: Bilateral;  . TUBAL LIGATION    . VAGINAL HYSTERECTOMY      There were no vitals filed for this visit.  Subjective Assessment - 07/24/19 1401    Subjective  Pt states that her bicep was sore yesterday evening and today following the massage. She is doing better with her daily activity and HEP but is still guarding it throughout  the day.    Pertinent History  B knee replacement    Currently in Pain?  Yes    Pain Score  3     Pain Location  --   Rt upper trap, Rt biceps   Pain Orientation  Right    Pain Descriptors / Indicators  Aching;Dull    Pain Type  Acute pain    Pain Radiating Towards  none    Pain Onset  1 to 4 weeks ago    Pain Frequency  Intermittent    Aggravating Factors   using the arm                       [facility] Adult PT Treatment/Exercise - 07/24/19 0001      Shoulder Exercises: Pulleys   Flexion  2 minutes    Flexion Limitations  Rt    Scaption  2 minutes    Scaption Limitations  Rt      Shoulder Exercises: ROM/Strengthening   [place]  L24 Rt UE flexion x10 reps, 8 reps with PT asss      Shoulder Exercises: Stretch   Other Shoulder Stretches  Rt upper trap and levator stretch in sitting 2x20 sec hold   Seated shoulder roll     Manual Therapy   Manual Therapy  Joint mobilization    Manual therapy comments  Trigger point release Rt levator scap with active Lt sidebend x10 reps, STM Rt upper trap, Rt deltoid    Joint Mobilization  Rt AC and [place] joint mobilization grade II-III x2 bouts     Other Manual Therapy  Rt pec contract/relax/stretch x5 reps, gentle Rt pac stretch        Trigger Point Dry Needling - 07/24/19 0001    Consent Given?  Yes    Education Handout Provided  Previously provided    Muscles Treated Head and Neck  Upper trapezius    Upper Trapezius Response  Twitch reponse elicited;Palpable increased muscle length   Rt          PT Education - 07/24/19 1440    Education Details  technique with therex    Person(s) Educated  Patient    Methods  Explanation;Verbal cues;Tactile cues    Comprehension  Verbalized understanding;Returned demonstration       PT Short Term Goals - 07/11/19 1213      PT SHORT TERM GOAL #1   Title  Pt will be independent with initial HEP to decrease inflammation and increase ROM.    Baseline  reports she is able to do the  HEP and demonstrates some improved ROM with less pain    Status  Achieved        PT Long Term Goals - 07/03/19 1638      PT LONG TERM GOAL #1   Title  Pt will have greater than 140 deg of active Rt shoulder flexion.    Time  4    Period  Weeks    Status  New    Target Date  08/01/19      PT LONG TERM GOAL #2   Title  Pt will have atleast 4/5 MMT strength of the Rt shoulder to increase her ability to reach overhead into the kitchen cabinet.    Time  4    Period  Weeks    Status  New      PT LONG TERM GOAL #3   Title  Pt will report atleast 40% improvement in her pain with sleeping.    Time  4    Period  Weeks    Status  New      PT LONG TERM GOAL #4   Title  Pt will have no more than 38% limitation on FOTO outcome measure    Time  4    Period  Weeks    Status  New            Plan - 07/24/19 1444    Clinical Impression Statement  Pt is doing well with return to work. She has been increasing her awareness of shoulder guarding since her last visit. Pt has multiple trigger points in the neck/scapular region and was agreeable to dry needling of the upper trap. There were several twitch responses elicited, however pt had difficulty remaining full relaxed, so PT focused on other manual techniques to address muscle spasm and joint restrictions. Pt was able to complete standing Rt shoulder flexion active assisted ROM without pain, as long as PT provided verbal cuing to avoid end range reaching and excessive shoulder shrug.    Personal Factors and Comorbidities  Time since onset of injury/illness/exacerbation;Past/Current Experience    Examination-Activity Limitations  Lift;Dressing;Sleep    Stability/Clinical Decision Making  Stable/Uncomplicated    Rehab Potential  Good    PT Frequency  Other (comment)  PT Duration  4 weeks    PT Treatment/Interventions  ADLs/Self Care Home Management;Aquatic Therapy;Electrical Stimulation;Moist Heat;Cryotherapy;Iontophoresis 4mg /ml  Dexamethasone;Therapeutic activities;Therapeutic exercise;Neuromuscular re-education;Manual techniques;Dry needling;Passive range of motion;Scar mobilization;Patient/family education;Taping    PT Next Visit Plan  trial reclined or upright-gentle AAROM; scap strength as able, continue with manual to address muscle spasm    PT Home Exercise Plan  [id number]    Consulted and Agree with Plan of Care  Patient       Patient will benefit from skilled therapeutic intervention in order to improve the following deficits and impairments:  Pain, Increased muscle spasms, Decreased mobility, Decreased activity tolerance, Decreased range of motion, Decreased strength, Hypomobility, Impaired flexibility, Increased edema  Visit Diagnosis: Acute pain of right shoulder  Stiffness of right shoulder, not elsewhere classified  Localized edema  Muscle weakness (generalized)  Other muscle spasm     Problem List Patient Active Problem List   Diagnosis Date Noted  . S/P TKR (total knee replacement), bilateral   . Gastroesophageal reflux disease   . Post-operative pain   . Elevated blood pressure reading   . Bilateral primary osteoarthritis of knee 07/03/2017  . Lateral epicondylitis of right elbow 04/02/2013    4:42 PM,07/24/19 09/21/19 PT, DPT [facility]  [phone]  [facility] [address], [address] [place], [place], [place] Phone: [phone]   Fax:  [phone]  Name: LEANNAH GUSE MRN: [name] Date of Birth: 1965-12-15

## 2019-07-28 ENCOUNTER — Ambulatory Visit: Payer: PRIVATE HEALTH INSURANCE | Attending: Orthopaedic Surgery | Admitting: Physical Therapy

## 2019-07-28 ENCOUNTER — Other Ambulatory Visit: Payer: Self-pay

## 2019-07-28 ENCOUNTER — Encounter: Payer: Self-pay | Admitting: Physical Therapy

## 2019-07-28 DIAGNOSIS — M62838 Other muscle spasm: Secondary | ICD-10-CM | POA: Insufficient documentation

## 2019-07-28 DIAGNOSIS — M25511 Pain in right shoulder: Secondary | ICD-10-CM | POA: Insufficient documentation

## 2019-07-28 DIAGNOSIS — M25611 Stiffness of right shoulder, not elsewhere classified: Secondary | ICD-10-CM | POA: Diagnosis present

## 2019-07-28 DIAGNOSIS — M6281 Muscle weakness (generalized): Secondary | ICD-10-CM | POA: Insufficient documentation

## 2019-07-28 DIAGNOSIS — R600 Localized edema: Secondary | ICD-10-CM | POA: Diagnosis present

## 2019-07-28 NOTE — Therapy (Signed)
[facility] [address], [address] [place], [place], [address] Phone: [phone]   Fax:  [phone]  Physical Therapy Treatment  Patient Details  Name: Samantha Curtis MRN: [id number] Date of Birth: Feb 23, 1966 Referring Provider (PT): Marcene Corning, MD   Encounter Date: 07/28/2019  PT End of Session - 07/28/19 1403    Visit Number  11    Date for PT Re-Evaluation  08/01/19    Authorization Type  WC 1 eval and 11 visits    Authorization Time Period  07/03/19    Authorization - Visit Number  4    Authorization - Number of Visits  12    PT Start Time  1401    PT Stop Time  1456    PT Time Calculation (min)  55 min    Activity Tolerance  Patient tolerated treatment well    Behavior During Therapy  [facility] for tasks assessed/performed       Past Medical History:  Diagnosis Date  . Arthritis    "knees" (07/04/2017)  . Dysrhythmia    PALPITATIONS  . GERD (gastroesophageal reflux disease)   . High cholesterol   . Leg pain     Past Surgical History:  Procedure Laterality Date  . ENDOVENOUS ABLATION SAPHENOUS VEIN W/ LASER Left 2005   [facility]  . JOINT REPLACEMENT    . KNEE ARTHROSCOPY Right   . SHOULDER ARTHROSCOPY Right    "spurs"  . SHOULDER ARTHROSCOPY Right 06/24/2019   Procedure: RIGHT SHOULDER ARTHROSCOPY; CHONDROPLASTY AND DEBRIEDMENT;  Surgeon: Marcene Corning, MD;  [facility];  Service: Orthopedics;  Laterality: Right;  . TOTAL KNEE ARTHROPLASTY Bilateral 07/03/2017   Procedure: TOTAL KNEE BILATERAL;  Surgeon: Marcene Corning, MD;  [facility];  Service: Orthopedics;  Laterality: Bilateral;  . TUBAL LIGATION    . VAGINAL HYSTERECTOMY      There were no vitals filed for this visit.  Subjective Assessment - 07/28/19 1409    Subjective  My neck hurt so bad starting Friday and worsening through the weekend. She reports taking more medicine than she ever has    Pertinent History  B knee replacement     Limitations  Lifting    Currently in Pain?  Yes    Pain Score  7     Pain Location  --   Rt neck and arm   Pain Orientation  Right    Pain Descriptors / Indicators  Aching;Sore                       [facility] Adult PT Treatment/Exercise - 07/28/19 0001      Shoulder Exercises: Supine   Other Supine Exercises  yellow scap stabilization in supine 2x5 : bow & arrow, horiizontal abd 2x5,       Shoulder Exercises: Prone   Retraction  AROM;Right;10 reps   2x5   Extension  AROM;Right;10 reps   2x5 TC to inhibit UT     Shoulder Exercises: Pulleys   Flexion  2 minutes   Pt in pain per report   Flexion Limitations  Rt    Scaption  2 minutes    Scaption Limitations  Rt      Electrical Stimulation   Electrical Stimulation Location  RT scap and shoulder    Electrical Stimulation Action  IFC in sitting    Electrical Stimulation Parameters  80-150 HZ to tolerance    Electrical Stimulation Goals  Pain  Manual Therapy   Other Manual Therapy  TP release, soft tissue mobilization medila scap border and RT upper trap   Applied biofreeze post              PT Short Term Goals - 07/11/19 1213      PT SHORT TERM GOAL #1   Title  Pt will be independent with initial HEP to decrease inflammation and increase ROM.    Baseline  reports she is able to do the HEP and demonstrates some improved ROM with less pain    Status  Achieved        PT Long Term Goals - 07/03/19 1638      PT LONG TERM GOAL #1   Title  Pt will have greater than 140 deg of active Rt shoulder flexion.    Time  4    Period  Weeks    Status  New    Target Date  08/01/19      PT LONG TERM GOAL #2   Title  Pt will have atleast 4/5 MMT strength of the Rt shoulder to increase her ability to reach overhead into the kitchen cabinet.    Time  4    Period  Weeks    Status  New      PT LONG TERM GOAL #3   Title  Pt will report atleast 40% improvement in her pain with sleeping.    Time  4     Period  Weeks    Status  New      PT LONG TERM GOAL #4   Title  Pt will have no more than 38% limitation on FOTO outcome measure    Time  4    Period  Weeks    Status  New            Plan - 07/28/19 1407    Clinical Impression Statement  Pt arrives with increased pain that hasbeen increasing since Friday. She thinks the manual work with the dry needling may have been too much although PTA noted to pt her resting tone to her Rt upper trap was much improved. Pt could get through yellow band scapular stabs in supine, low reps, low volume. This was tolerable pt reports but not pain free. Pt requested some work on her neck because of th ehigh level of pain and discomfort. Used Estim at end of session for pain relief. Pain was 2/10 at end of session.    Personal Factors and Comorbidities  Time since onset of injury/illness/exacerbation;Past/Current Experience    Examination-Activity Limitations  Lift;Dressing;Sleep    Stability/Clinical Decision Making  Stable/Uncomplicated    Rehab Potential  Good    PT Frequency  Other (comment)    PT Duration  4 weeks    PT Treatment/Interventions  ADLs/Self Care Home Management;Aquatic Therapy;Electrical Stimulation;Moist Heat;Cryotherapy;Iontophoresis 4mg /ml Dexamethasone;Therapeutic activities;Therapeutic exercise;Neuromuscular re-education;Manual techniques;Dry needling;Passive range of motion;Scar mobilization;Patient/family education;Taping    PT Next Visit Plan  trial reclined or upright-gentle AAROM; scap strength as able, continue with manual to address muscle spasm    PT Home Exercise Plan  [id number]    Consulted and Agree with Plan of Care  Patient       Patient will benefit from skilled therapeutic intervention in order to improve the following deficits and impairments:  Pain, Increased muscle spasms, Decreased mobility, Decreased activity tolerance, Decreased range of motion, Decreased strength, Hypomobility, Impaired flexibility, Increased  edema  Visit Diagnosis: Acute pain of right shoulder  Stiffness of  right shoulder, not elsewhere classified  Localized edema  Muscle weakness (generalized)  Other muscle spasm     Problem List Patient Active Problem List   Diagnosis Date Noted  . S/P TKR (total knee replacement), bilateral   . Gastroesophageal reflux disease   . Post-operative pain   . Elevated blood pressure reading   . Bilateral primary osteoarthritis of knee 07/03/2017  . Lateral epicondylitis of right elbow 04/02/2013    Dannilynn Gallina, PTA 07/28/2019, 4:14 PM  [facility] [address], [address] [place], [place], [address] Phone: [phone]   Fax:  [phone]  Name: TIJAH HANE MRN: [id number] Date of Birth: 08-23-1965

## 2019-07-29 ENCOUNTER — Encounter: Payer: Self-pay | Admitting: Physical Therapy

## 2019-07-29 ENCOUNTER — Ambulatory Visit: Payer: PRIVATE HEALTH INSURANCE | Admitting: Physical Therapy

## 2019-07-29 ENCOUNTER — Other Ambulatory Visit: Payer: Self-pay

## 2019-07-29 DIAGNOSIS — M62838 Other muscle spasm: Secondary | ICD-10-CM

## 2019-07-29 DIAGNOSIS — M25611 Stiffness of right shoulder, not elsewhere classified: Secondary | ICD-10-CM

## 2019-07-29 DIAGNOSIS — R600 Localized edema: Secondary | ICD-10-CM

## 2019-07-29 DIAGNOSIS — M25511 Pain in right shoulder: Secondary | ICD-10-CM | POA: Diagnosis not present

## 2019-07-29 DIAGNOSIS — M6281 Muscle weakness (generalized): Secondary | ICD-10-CM

## 2019-07-29 NOTE — Therapy (Signed)
[facility] [address], [address] [place], [place], [address] Phone: [phone]   Fax:  [phone]  Physical Therapy Treatment  Patient Details  Name: Samantha Curtis MRN: [id number] Date of Birth: 07-Feb-1966 Referring Provider (PT): Marcene Corning, MD   Encounter Date: 07/29/2019  PT End of Session - 07/29/19 1538    Visit Number  12    Date for PT Re-Evaluation  08/01/19    Authorization Type  WC 1 eval and 11 visits    Authorization Time Period  07/03/19    Authorization - Visit Number  4    Authorization - Number of Visits  12    PT Start Time  1400    PT Stop Time  1452    PT Time Calculation (min)  52 min    Activity Tolerance  Patient tolerated treatment well;Patient limited by pain    Behavior During Therapy  [facility] for tasks assessed/performed       Past Medical History:  Diagnosis Date  . Arthritis    "knees" (07/04/2017)  . Dysrhythmia    PALPITATIONS  . GERD (gastroesophageal reflux disease)   . High cholesterol   . Leg pain     Past Surgical History:  Procedure Laterality Date  . ENDOVENOUS ABLATION SAPHENOUS VEIN W/ LASER Left 2005   [facility]  . JOINT REPLACEMENT    . KNEE ARTHROSCOPY Right   . SHOULDER ARTHROSCOPY Right    "spurs"  . SHOULDER ARTHROSCOPY Right 06/24/2019   Procedure: RIGHT SHOULDER ARTHROSCOPY; CHONDROPLASTY AND DEBRIEDMENT;  Surgeon: Marcene Corning, MD;  [facility];  Service: Orthopedics;  Laterality: Right;  . TOTAL KNEE ARTHROPLASTY Bilateral 07/03/2017   Procedure: TOTAL KNEE BILATERAL;  Surgeon: Marcene Corning, MD;  [facility];  Service: Orthopedics;  Laterality: Bilateral;  . TUBAL LIGATION    . VAGINAL HYSTERECTOMY      There were no vitals filed for this visit.  Subjective Assessment - 07/29/19 1402    Subjective  Pt states that sleep continues to be an issue for her. She has pain mostly when resting and when she is busy or moving it doesn't seem as  bad.    Pertinent History  B knee replacement    Limitations  Lifting    Currently in Pain?  Yes    Pain Location  Arm    Pain Orientation  Right;Anterior    Pain Descriptors / Indicators  Aching;Sore                       [facility] Adult PT Treatment/Exercise - 07/29/19 0001      Shoulder Exercises: Supine   Other Supine Exercises  Rt scap retraction x10 reps     Other Supine Exercises  cervical rotation Lt/Rt x10 reps without Rt shoulder shrug       Shoulder Exercises: Seated   Other Seated Exercises  neck retraction x10 reps, tactile cuing for proper technique      Shoulder Exercises: Sidelying   External Rotation  Right;AROM    Other Sidelying Exercises  Rt UE protraction/press with therapist support x10 reps       Shoulder Exercises: Standing   Extension  Left;Right;AAROM;5 reps    Extension Limitations  x3 sets without pain       Shoulder Exercises: ROM/Strengthening   Other ROM/Strengthening Exercises  Rt UE finger ladder x5 reps, to L16 pain free       Modalities   Modalities  Cryotherapy      Cryotherapy   Number Minutes Cryotherapy  10 Minutes    Cryotherapy Location  Shoulder   Rt   Type of Cryotherapy  Ice pack   during IFC     Electrical Stimulation   Electrical Stimulation Location  RT scap and shoulder    Electrical Stimulation Action  IFC in sitting     Electrical Stimulation Parameters  80-150 Hz to tolerance     Electrical Stimulation Goals  Pain      Manual Therapy   Other Manual Therapy  gentle STM Rt levator scap, educating pt on gentle self release for home              PT Education - 07/29/19 1537    Education Details  importance of avoiding excessive use of Rt UE and following MD restrictions particularly throughout the work day.    Person(s) Educated  Patient    Methods  Explanation;Verbal cues    Comprehension  Verbalized understanding;Returned demonstration       PT Short Term Goals - 07/11/19 1213      PT SHORT  TERM GOAL #1   Title  Pt will be independent with initial HEP to decrease inflammation and increase ROM.    Baseline  reports she is able to do the HEP and demonstrates some improved ROM with less pain    Status  Achieved        PT Long Term Goals - 07/03/19 1638      PT LONG TERM GOAL #1   Title  Pt will have greater than 140 deg of active Rt shoulder flexion.    Time  4    Period  Weeks    Status  New    Target Date  08/01/19      PT LONG TERM GOAL #2   Title  Pt will have atleast 4/5 MMT strength of the Rt shoulder to increase her ability to reach overhead into the kitchen cabinet.    Time  4    Period  Weeks    Status  New      PT LONG TERM GOAL #3   Title  Pt will report atleast 40% improvement in her pain with sleeping.    Time  4    Period  Weeks    Status  New      PT LONG TERM GOAL #4   Title  Pt will have no more than 38% limitation on FOTO outcome measure    Time  4    Period  Weeks    Status  New            Plan - 07/29/19 1707    Clinical Impression Statement  Pt continues to have increased Rt shoulder pain and guarding throughout her day. Her sleep is often disrupted despite her attempts to reposition multiple times. Pt is highly sensitive to palpation of the posterior shoulder/medial scapula border but did better with self-trigger point release to this area. Pt was educated on importance of maintaining MD activity restrictions with light duty at work. Ended with TENS/cold end of session to decrease inflammation of the Rt shoulder. Will further assess progress towards her goals at the next appointment.    Personal Factors and Comorbidities  Time since onset of injury/illness/exacerbation;Past/Current Experience    Examination-Activity Limitations  Lift;Dressing;Sleep    Stability/Clinical Decision Making  Stable/Uncomplicated    Rehab Potential  Good    PT Frequency  Other (comment)  PT Duration  4 weeks    PT Treatment/Interventions  ADLs/Self Care  Home Management;Aquatic Therapy;Electrical Stimulation;Moist Heat;Cryotherapy;Iontophoresis 4mg /ml Dexamethasone;Therapeutic activities;Therapeutic exercise;Neuromuscular re-education;Manual techniques;Dry needling;Passive range of motion;Scar mobilization;Patient/family education;Taping    PT Next Visit Plan  assess goals; shoulder measurements-possibly decrease to 2x/week due to high irritability/pain in the shoulder to allow for more recovery time between sessions    PT Home Exercise Plan  [facility]    Consulted and Agree with Plan of Care  Patient       Patient will benefit from skilled therapeutic intervention in order to improve the following deficits and impairments:  Pain, Increased muscle spasms, Decreased mobility, Decreased activity tolerance, Decreased range of motion, Decreased strength, Hypomobility, Impaired flexibility, Increased edema  Visit Diagnosis: Acute pain of right shoulder  Stiffness of right shoulder, not elsewhere classified  Localized edema  Muscle weakness (generalized)  Other muscle spasm     Problem List Patient Active Problem List   Diagnosis Date Noted  . S/P TKR (total knee replacement), bilateral   . Gastroesophageal reflux disease   . Post-operative pain   . Elevated blood pressure reading   . Bilateral primary osteoarthritis of knee 07/03/2017  . Lateral epicondylitis of right elbow 04/02/2013    5:21 PM,07/29/19 09/26/19 PT, DPT [facility]  [phone]  [facility] [address], [address] [place], [place], [place] Phone: [phone]   Fax:  [phone]  Name: KATELEEN ENCARNACION MRN: [name] Date of Birth: [birth year]

## 2019-07-30 ENCOUNTER — Ambulatory Visit: Payer: PRIVATE HEALTH INSURANCE | Admitting: Physical Therapy

## 2019-07-30 ENCOUNTER — Other Ambulatory Visit: Payer: Self-pay

## 2019-07-30 ENCOUNTER — Encounter: Payer: Self-pay | Admitting: Physical Therapy

## 2019-07-30 DIAGNOSIS — M25511 Pain in right shoulder: Secondary | ICD-10-CM

## 2019-07-30 DIAGNOSIS — R600 Localized edema: Secondary | ICD-10-CM

## 2019-07-30 DIAGNOSIS — M25611 Stiffness of right shoulder, not elsewhere classified: Secondary | ICD-10-CM

## 2019-07-30 DIAGNOSIS — M62838 Other muscle spasm: Secondary | ICD-10-CM

## 2019-07-30 DIAGNOSIS — M6281 Muscle weakness (generalized): Secondary | ICD-10-CM

## 2019-07-30 NOTE — Therapy (Addendum)
[facility] [address] [place], [place], [address] Phone: [phone]   Fax:  [phone]  Physical Therapy Treatment  Patient Details  Name: Samantha Curtis MRN: [id number] Date of Birth: 12/20/1965 Referring Provider (PT): Melrose Nakayama, MD   Encounter Date: 07/30/2019  PT End of Session - 07/30/19 1615    Visit Number  13    Date for PT Re-Evaluation  08/01/19    Authorization - Visit Number  5    Authorization - Number of Visits  12    PT Start Time  7078    PT Stop Time  1703    PT Time Calculation (min)  48 min    Activity Tolerance  Patient tolerated treatment well    Behavior During Therapy  [facility] for tasks assessed/performed       Past Medical History:  Diagnosis Date  . Arthritis    "knees" (07/04/2017)  . Dysrhythmia    PALPITATIONS  . GERD (gastroesophageal reflux disease)   . High cholesterol   . Leg pain     Past Surgical History:  Procedure Laterality Date  . ENDOVENOUS ABLATION SAPHENOUS VEIN W/ LASER Left 2005   [facility]  . JOINT REPLACEMENT    . KNEE ARTHROSCOPY Right   . SHOULDER ARTHROSCOPY Right    "spurs"  . SHOULDER ARTHROSCOPY Right 06/24/2019   Procedure: RIGHT SHOULDER ARTHROSCOPY; CHONDROPLASTY AND DEBRIEDMENT;  Surgeon: Melrose Nakayama, MD;  Location: [facility];  Service: Orthopedics;  Laterality: Right;  . TOTAL KNEE ARTHROPLASTY Bilateral 07/03/2017   Procedure: TOTAL KNEE BILATERAL;  Surgeon: Melrose Nakayama, MD;  Location: [place];  Service: Orthopedics;  Laterality: Bilateral;  . TUBAL LIGATION    . VAGINAL HYSTERECTOMY      There were no vitals filed for this visit.  Subjective Assessment - 07/30/19 1616    Subjective  Better day, slept little better last night. I think three days a week might be too much, i need a little rest in between sin ce I am working.    Pertinent History  B knee replacement    Pain Score  3     Pain Location  Shoulder    Pain Orientation   Right    Pain Descriptors / Indicators  Dull;Sore    Multiple Pain Sites  No         [facility] PT Assessment - 07/30/19 0001      AROM   Right Shoulder Flexion  138 Degrees    Right Shoulder ABduction  100 Degrees   slightly out of plane into horizontal adduction less painful   Right Shoulder Internal Rotation  --   behind back upper lumbar   Right Shoulder External Rotation  --   Functionally touches lateral side of head                  [facility] Adult PT Treatment/Exercise - 07/30/19 0001      Shoulder Exercises: Supine   Other Supine Exercises  RT UE rows 2x10, TC at post shoulder as pt tends to let humerus go anterior. This was not comfortable. Horizontal abd  2x5, TC  to help assit in scap depression. Again painful at [facility] joint      Shoulder Exercises: Sidelying   External Rotation  AROM;Strengthening;Right;20 reps    External Rotation Limitations  TC for proper stabilization at shoulder      Shoulder Exercises: Pulleys   Flexion  2 minutes   Pt in pain  per report   Flexion Limitations  RT      Moist Heat Therapy   Number Minutes Moist Heat  10 Minutes    Moist Heat Location  Shoulder   with Estim     Electrical Stimulation   Electrical Stimulation Location  Rt shoulder & scapula    Electrical Stimulation Action  IFc in sitting    Electrical Stimulation Parameters  80-150 Hz to tolerance    Electrical Stimulation Goals  Pain      Manual Therapy   Manual therapy comments  K-Tape to Rt shoulder and UT to support GH joint and inhibition to upper trap               PT Short Term Goals - 07/30/19 1656      PT SHORT TERM GOAL #1   Title  Pt will be independent with initial HEP to decrease inflammation and increase ROM.    Time  2    Period  Weeks    Status  Achieved    Target Date  07/17/19      PT SHORT TERM GOAL #2   Title  AROM Rt shoulder flexion 120    Time  4    Period  Weeks    Status  Achieved   138   Target Date  02/18/19         PT Long Term Goals - 07/30/19 1656      PT LONG TERM GOAL #3   Title  Pt will report atleast 40% improvement in her pain with sleeping.    Time  4    Period  Weeks    Status  On-going   Still painful at night.     PT LONG TERM GOAL #4   Title  Pt will have no more than 38% limitation on FOTO outcome measure    Time  4    Period  Weeks    Status  On-going   did not get to assess FOTO today     PT LONG TERM GOAL #5   Title  Pt will be able to lift 20 lb up to waist height for return to full function at work    Time  8    Period  Weeks    Status  Partially Met            Plan - 07/30/19 1652    Clinical Impression Statement  Pt arrives with much lower pain than she previously has had over her few sessions. She reports even though some of the exercises do hurt, she understands and 'feels" the benefit "becasue i am so weak." Scapular stabilization exercises were agian painful but pt was able to demo a faster correction at her glenhumeral joint than prior where there was an obvious delay. pt's preference is still an anteriorly positioned humeral head. Estim at end of session greatly reduces her pain. We even tried some Ktape that she can wear for the 5 days to help support her joint and inhibit the upper trap.    Personal Factors and Comorbidities  Time since onset of injury/illness/exacerbation;Past/Current Experience    Examination-Activity Limitations  Lift;Dressing;Sleep    Stability/Clinical Decision Making  Stable/Uncomplicated    Rehab Potential  Good    PT Frequency  Other (comment)    PT Duration  4 weeks    PT Treatment/Interventions  ADLs/Self Care Home Management;Aquatic Therapy;Electrical Stimulation;Moist Heat;Cryotherapy;Iontophoresis '4mg'$ /ml Dexamethasone;Therapeutic activities;Therapeutic exercise;Neuromuscular re-education;Manual techniques;Dry needling;Passive range of motion;Scar mobilization;Patient/family education;Taping  PT Next Visit Plan  Pt ok to  come 2x a week.    PT Home Exercise Plan  [id number]    Consulted and Agree with Plan of Care  Patient       Patient will benefit from skilled therapeutic intervention in order to improve the following deficits and impairments:  Pain, Increased muscle spasms, Decreased mobility, Decreased activity tolerance, Decreased range of motion, Decreased strength, Hypomobility, Impaired flexibility, Increased edema  Visit Diagnosis: Acute pain of right shoulder  Stiffness of right shoulder, not elsewhere classified  Localized edema  Muscle weakness (generalized)  Other muscle spasm     Problem List Patient Active Problem List   Diagnosis Date Noted  . S/P TKR (total knee replacement), bilateral   . Gastroesophageal reflux disease   . Post-operative pain   . Elevated blood pressure reading   . Bilateral primary osteoarthritis of knee 07/03/2017  . Lateral epicondylitis of right elbow 04/02/2013   Myrene Galas, PTA 07/30/19 5:06 PM  [facility] [address], [place], [place], [address] Phone: [phone]   Fax:  [phone]  Name: NANCYE GRUMBINE MRN: [id number] Date of Birth: Mar 02, 1966  I have reviewed the above information and measurements completed by Myrene Galas PTA. Pt has met her short term goals, and she demonstrates improved active shoulder flexion. Shoulder active ROM is still very painful and weakness is preventing her from fully participating in her daily activity at work and home. Pt would continue to benefit from skilled PT up to 2x/week to allow for proper education and safe progression of shoulder strength, endurance and neuromuscular control.  8:24 AM,07/31/19 Sherol Dade PT, [place] at [place]

## 2019-07-31 ENCOUNTER — Encounter: Payer: No Typology Code available for payment source | Admitting: Physical Therapy

## 2019-07-31 NOTE — Addendum Note (Signed)
Addended byDonita Brooks on: 07/31/2019 08:28 AM   Modules accepted: Orders

## 2019-08-05 ENCOUNTER — Encounter: Payer: Self-pay | Admitting: Physical Therapy

## 2019-08-05 ENCOUNTER — Ambulatory Visit: Payer: PRIVATE HEALTH INSURANCE | Admitting: Physical Therapy

## 2019-08-05 ENCOUNTER — Other Ambulatory Visit: Payer: Self-pay

## 2019-08-05 DIAGNOSIS — M6281 Muscle weakness (generalized): Secondary | ICD-10-CM

## 2019-08-05 DIAGNOSIS — M25511 Pain in right shoulder: Secondary | ICD-10-CM

## 2019-08-05 DIAGNOSIS — M25611 Stiffness of right shoulder, not elsewhere classified: Secondary | ICD-10-CM

## 2019-08-05 DIAGNOSIS — M62838 Other muscle spasm: Secondary | ICD-10-CM

## 2019-08-05 DIAGNOSIS — R600 Localized edema: Secondary | ICD-10-CM

## 2019-08-05 NOTE — Therapy (Signed)
[facility] [address], [place], [place], [address] Phone: [phone]   Fax:  [phone]  Physical Therapy Treatment  Patient Details  Name: Samantha Curtis MRN: [id number] Date of Birth: [birth year] Referring Provider (PT): Melrose Nakayama, MD   Encounter Date: 08/05/2019  PT End of Session - 08/05/19 1549    Visit Number  14    Date for PT Re-Evaluation  09/12/19    Authorization Type  [facility] 24 total visits    Authorization - Visit Number  14    Authorization - Number of Visits  24    PT Start Time  6834    PT Stop Time  1605    PT Time Calculation (min)  41 min    Activity Tolerance  Patient tolerated treatment well    Behavior During Therapy  [facility] for tasks assessed/performed       Past Medical History:  Diagnosis Date  . Arthritis    "knees" (07/04/2017)  . Dysrhythmia    PALPITATIONS  . GERD (gastroesophageal reflux disease)   . High cholesterol   . Leg pain     Past Surgical History:  Procedure Laterality Date  . ENDOVENOUS ABLATION SAPHENOUS VEIN W/ LASER Left 2005   [facility]  . JOINT REPLACEMENT    . KNEE ARTHROSCOPY Right   . SHOULDER ARTHROSCOPY Right    "spurs"  . SHOULDER ARTHROSCOPY Right 06/24/2019   Procedure: RIGHT SHOULDER ARTHROSCOPY; CHONDROPLASTY AND DEBRIEDMENT;  Surgeon: Melrose Nakayama, MD;  [facility];  Service: Orthopedics;  Laterality: Right;  . TOTAL KNEE ARTHROPLASTY Bilateral 07/03/2017   Procedure: TOTAL KNEE BILATERAL;  Surgeon: Melrose Nakayama, MD;  Location: [place];  Service: Orthopedics;  Laterality: Bilateral;  . TUBAL LIGATION    . VAGINAL HYSTERECTOMY      There were no vitals filed for this visit.  Subjective Assessment - 08/05/19 1529    Subjective  Pt states that her arm is doing better now. She has been cutting back some at work.    Pertinent History  B knee replacement    Currently in Pain?  No/denies                       [facility]  Adult PT Treatment/Exercise - 08/05/19 0001      Shoulder Exercises: Supine   Flexion  AAROM;Both;10 reps    Flexion Limitations  reclined position      Shoulder Exercises: Seated   Other Seated Exercises  Rt scapular clocks x10 reps       Shoulder Exercises: Prone   Other Prone Exercises  Rt row with PT cuing for scapula retraction and avoiding forward glenohumeral movement       Shoulder Exercises: Sidelying   ABduction  Right;10 reps    Other Sidelying Exercises  unable to complete horizontal abduction       Shoulder Exercises: Standing   Other Standing Exercises  straight arm plank on countertop 3x10 sec hold       Shoulder Exercises: ROM/Strengthening   Ball on Wall  Rt UE with external perturbations 2x30 sec     Other ROM/Strengthening Exercises  finger ladder to L20 x5 reps       Manual Therapy   Other Manual Therapy  STM Rt infraspinatus, Rt teres minor             PT Education - 08/05/19 1608    Education Details  technique with therex; updates to HEP  Person(s) Educated  Patient    Methods  Explanation;Verbal cues;Handout    Comprehension  Verbalized understanding;Returned demonstration       PT Short Term Goals - 07/30/19 1656      PT SHORT TERM GOAL #1   Title  Pt will be independent with initial HEP to decrease inflammation and increase ROM.    Time  2    Period  Weeks    Status  Achieved    Target Date  07/17/19      PT SHORT TERM GOAL #2   Title  AROM Rt shoulder flexion 120    Time  4    Period  Weeks    Status  Achieved   138   Target Date  02/18/19        PT Long Term Goals - 07/30/19 1656      PT LONG TERM GOAL #3   Title  Pt will report atleast 40% improvement in her pain with sleeping.    Time  4    Period  Weeks    Status  On-going   Still painful at night.     PT LONG TERM GOAL #4   Title  Pt will have no more than 38% limitation on FOTO outcome measure    Time  4    Period  Weeks    Status  On-going   did not get to  assess FOTO today     PT LONG TERM GOAL #5   Title  Pt will be able to lift 20 lb up to waist height for return to full function at work    Time  8    Period  Weeks    Status  Partially Met            Plan - 08/05/19 1608    Clinical Impression Statement  Pt has been doing much better this past week. She is making more of an effort at work to avoid overusing the arm and is progressing her HEP without any issues. PT made some adjustments to active assisted ROM in a reclined and sidelying position to encourage strength through a greater ROM than with her table slides. Pt required PT tactile cuing initially with scapula clocks but was able to demonstrate good control with this after several repetitions. Pt denied any increased pain in the shoulder end of session, but she did have reasonable shoulder fatigue. Will continue with current POC.    Personal Factors and Comorbidities  Time since onset of injury/illness/exacerbation;Past/Current Experience    Examination-Activity Limitations  Lift;Dressing;Sleep    Stability/Clinical Decision Making  Stable/Uncomplicated    Rehab Potential  Good    PT Frequency  Other (comment)   1-2x/week as mobility/strength improves   PT Duration  6 weeks    PT Treatment/Interventions  ADLs/Self Care Home Management;Aquatic Therapy;Electrical Stimulation;Moist Heat;Cryotherapy;Iontophoresis 40m/ml Dexamethasone;Therapeutic activities;Therapeutic exercise;Neuromuscular re-education;Manual techniques;Dry needling;Passive range of motion;Scar mobilization;Patient/family education;Taping    PT Next Visit Plan  progression of shoulder AROM; scap strength    PT Home Exercise Plan  [id number]    Consulted and Agree with Plan of Care  Patient       Patient will benefit from skilled therapeutic intervention in order to improve the following deficits and impairments:  Pain, Increased muscle spasms, Decreased mobility, Decreased activity tolerance, Decreased range of  motion, Decreased strength, Hypomobility, Impaired flexibility, Increased edema  Visit Diagnosis: Acute pain of right shoulder  Stiffness of right shoulder, not elsewhere classified  Localized edema  Other muscle spasm  Muscle weakness (generalized)     Problem List Patient Active Problem List   Diagnosis Date Noted  . S/P TKR (total knee replacement), bilateral   . Gastroesophageal reflux disease   . Post-operative pain   . Elevated blood pressure reading   . Bilateral primary osteoarthritis of knee 07/03/2017  . Lateral epicondylitis of right elbow 04/02/2013    4:57 PM,08/05/19 Sherol Dade PT, DPT [place] [facility] [place] [address] [place], [place], [address] Phone: [phone]   Fax:  [phone]  Name: Samantha Curtis MRN: [id number] Date of Birth: 1965/08/02

## 2019-08-05 NOTE — Patient Instructions (Signed)
Access Code: [email or web address]  URL: [email or web address]  Date: 08/05/2019  Prepared by: Donita Brooks    Exercises Standing Backward Shoulder Rolls- 10-15 reps- 1x daily- 7x weekly  Seated Scapular Retraction- 10 reps- 3 sets- 2x daily- 7x weekly  Horizontal Shoulder Pendulum with Table Support- 20 reps- 1 sets- 2x daily- 7x weekly  Sidelying Shoulder Abduction Palm Forward- 10 reps- 1 sets- 1x daily- 7x weekly  Sidelying Shoulder External Rotation- 10 reps- 2 sets- 2x daily- 7x weekly  Supine Shoulder Flexion with Dowel- 10 reps- 1 sets- 1x daily- 7x weekly  Prone Shoulder Extension - Single Arm- 10 reps- 2 sets- 1x daily- 7x weekly    [facility] [address], [address] [place], [place] [address] Phone # [phone] Fax [phone]

## 2019-08-06 ENCOUNTER — Ambulatory Visit: Payer: PRIVATE HEALTH INSURANCE | Admitting: Physical Therapy

## 2019-08-06 ENCOUNTER — Other Ambulatory Visit: Payer: Self-pay

## 2019-08-06 ENCOUNTER — Encounter: Payer: Self-pay | Admitting: Physical Therapy

## 2019-08-06 DIAGNOSIS — M25611 Stiffness of right shoulder, not elsewhere classified: Secondary | ICD-10-CM

## 2019-08-06 DIAGNOSIS — M6281 Muscle weakness (generalized): Secondary | ICD-10-CM

## 2019-08-06 DIAGNOSIS — M62838 Other muscle spasm: Secondary | ICD-10-CM

## 2019-08-06 DIAGNOSIS — M25511 Pain in right shoulder: Secondary | ICD-10-CM | POA: Diagnosis not present

## 2019-08-06 DIAGNOSIS — R600 Localized edema: Secondary | ICD-10-CM

## 2019-08-06 NOTE — Therapy (Signed)
[facility] [address], [place], [place], [address] Phone: [phone]   Fax:  [phone]  Physical Therapy Treatment  Patient Details  Name: Samantha Curtis MRN: [id number] Date of Birth: 01-12-1966 Referring Provider (PT): Melrose Nakayama, MD   Encounter Date: 08/06/2019  PT End of Session - 08/06/19 1535    Visit Number  15    Date for PT Re-Evaluation  09/12/19    Authorization Type  WC 24 total visits    Authorization Time Period  07/03/19    Authorization - Visit Number  15    Authorization - Number of Visits  24    PT Start Time  3235    PT Stop Time  1606    PT Time Calculation (min)  41 min    Activity Tolerance  Patient tolerated treatment well    Behavior During Therapy  [facility] for tasks assessed/performed       Past Medical History:  Diagnosis Date  . Arthritis    "knees" (07/04/2017)  . Dysrhythmia    PALPITATIONS  . GERD (gastroesophageal reflux disease)   . High cholesterol   . Leg pain     Past Surgical History:  Procedure Laterality Date  . ENDOVENOUS ABLATION SAPHENOUS VEIN W/ LASER Left 2005   [facility]  . JOINT REPLACEMENT    . KNEE ARTHROSCOPY Right   . SHOULDER ARTHROSCOPY Right    "spurs"  . SHOULDER ARTHROSCOPY Right 06/24/2019   Procedure: RIGHT SHOULDER ARTHROSCOPY; CHONDROPLASTY AND DEBRIEDMENT;  Surgeon: Melrose Nakayama, MD;  [facility];  Service: Orthopedics;  Laterality: Right;  . TOTAL KNEE ARTHROPLASTY Bilateral 07/03/2017   Procedure: TOTAL KNEE BILATERAL;  Surgeon: Melrose Nakayama, MD;  Location: [place];  Service: Orthopedics;  Laterality: Bilateral;  . TUBAL LIGATION    . VAGINAL HYSTERECTOMY      There were no vitals filed for this visit.  Subjective Assessment - 08/06/19 1536    Subjective  My week has been much better.    Currently in Pain?  Yes    Pain Location  Shoulder    Pain Orientation  Right    Pain Descriptors / Indicators  Dull;Aching     Multiple Pain Sites  No                       [facility] Adult PT Treatment/Exercise - 08/06/19 0001      Shoulder Exercises: Supine   Flexion  AAROM;Both;10 reps   Elbow bent and elbow straight.   Other Supine Exercises  Attempted D2 flexion 5x, painful       Shoulder Exercises: Sidelying   External Rotation  AROM;Strengthening;Right;20 reps    External Rotation Limitations  TC for proper stabilization at shoulder    ABduction  AROM;Strengthening;Right;10 reps   2 sets     Shoulder Exercises: Standing   Other Standing Exercises  straight arm plank on wall 3x15 sec hold     Other Standing Exercises  Ball up & down on wall 12 & 6  10x      Shoulder Exercises: Pulleys   Flexion  2 minutes   Pt in pain per report   Flexion Limitations  RT      Manual Therapy   Manual therapy comments  K-Tape to Rt shoulder and UT to support [facility] joint               PT Short Term Goals - 07/30/19 1656  PT SHORT TERM GOAL #1   Title  Pt will be independent with initial HEP to decrease inflammation and increase ROM.    Time  2    Period  Weeks    Status  Achieved    Target Date  07/17/19      PT SHORT TERM GOAL #2   Title  AROM Rt shoulder flexion 120    Time  4    Period  Weeks    Status  Achieved   138   Target Date  02/18/19        PT Long Term Goals - 07/30/19 1656      PT LONG TERM GOAL #3   Title  Pt will report atleast 40% improvement in her pain with sleeping.    Time  4    Period  Weeks    Status  On-going   Still painful at night.     PT LONG TERM GOAL #4   Title  Pt will have no more than 38% limitation on FOTO outcome measure    Time  4    Period  Weeks    Status  On-going   did not get to assess FOTO today     PT LONG TERM GOAL #5   Title  Pt will be able to lift 20 lb up to waist height for return to full function at work    Time  8    Period  Weeks    Status  Partially Met            Plan - 08/06/19 1606    Clinical  Impression Statement  Pt continues to work on her scapular stabilization exercises, having some pain with D2 flexion ( the return was painful). Pt required less tactile cuing at her scapula today, most in sidelying position. Pt requested Ktape to [facility] joint for support at conclusion of session today.    Personal Factors and Comorbidities  Time since onset of injury/illness/exacerbation;Past/Current Experience    Examination-Activity Limitations  Lift;Dressing;Sleep    Stability/Clinical Decision Making  Stable/Uncomplicated    Rehab Potential  Good    PT Frequency  Other (comment)    PT Duration  6 weeks    PT Treatment/Interventions  ADLs/Self Care Home Management;Aquatic Therapy;Electrical Stimulation;Moist Heat;Cryotherapy;Iontophoresis '4mg'$ /ml Dexamethasone;Therapeutic activities;Therapeutic exercise;Neuromuscular re-education;Manual techniques;Dry needling;Passive range of motion;Scar mobilization;Patient/family education;Taping    PT Next Visit Plan  progression of shoulder AROM; scap strength    PT Home Exercise Plan  [id number]    Consulted and Agree with Plan of Care  Patient       Patient will benefit from skilled therapeutic intervention in order to improve the following deficits and impairments:  Pain, Increased muscle spasms, Decreased mobility, Decreased activity tolerance, Decreased range of motion, Decreased strength, Hypomobility, Impaired flexibility, Increased edema  Visit Diagnosis: Acute pain of right shoulder  Stiffness of right shoulder, not elsewhere classified  Localized edema  Muscle weakness (generalized)  Other muscle spasm     Problem List Patient Active Problem List   Diagnosis Date Noted  . S/P TKR (total knee replacement), bilateral   . Gastroesophageal reflux disease   . Post-operative pain   . Elevated blood pressure reading   . Bilateral primary osteoarthritis of knee 07/03/2017  . Lateral epicondylitis of right elbow 04/02/2013     Madiline Saffran, PTA 08/06/2019, 4:09 PM  [facility] [address] [place], [place], [address] Phone: [phone]   Fax:  [phone]  Name: ADRIANNAH STEINKAMP MRN: [id number] Date of Birth: [birth year]

## 2019-08-07 ENCOUNTER — Encounter: Payer: No Typology Code available for payment source | Admitting: Physical Therapy

## 2019-08-11 ENCOUNTER — Other Ambulatory Visit: Payer: Self-pay

## 2019-08-11 ENCOUNTER — Encounter: Payer: Self-pay | Admitting: Physical Therapy

## 2019-08-11 ENCOUNTER — Ambulatory Visit: Payer: PRIVATE HEALTH INSURANCE | Admitting: Physical Therapy

## 2019-08-11 DIAGNOSIS — M25611 Stiffness of right shoulder, not elsewhere classified: Secondary | ICD-10-CM

## 2019-08-11 DIAGNOSIS — M62838 Other muscle spasm: Secondary | ICD-10-CM

## 2019-08-11 DIAGNOSIS — M6281 Muscle weakness (generalized): Secondary | ICD-10-CM

## 2019-08-11 DIAGNOSIS — M25511 Pain in right shoulder: Secondary | ICD-10-CM | POA: Diagnosis not present

## 2019-08-11 DIAGNOSIS — R600 Localized edema: Secondary | ICD-10-CM

## 2019-08-11 NOTE — Therapy (Signed)
[facility] [address] [place], [place], [address] Phone: [phone]   Fax:  [phone]  Physical Therapy Treatment  Patient Details  Name: Samantha Curtis MRN: [id number] Date of Birth: February 17, 1966 Referring Provider (PT): Melrose Nakayama, MD   Encounter Date: 08/11/2019  PT End of Session - 08/11/19 1445    Visit Number  16    Date for PT Re-Evaluation  09/12/19    Authorization Type  WC 24 total visits    Authorization Time Period  07/03/19    Authorization - Visit Number  10    Authorization - Number of Visits  24    PT Start Time  8309    PT Stop Time  1540    PT Time Calculation (min)  55 min    Activity Tolerance  Patient tolerated treatment well    Behavior During Therapy  [facility] for tasks assessed/performed       Past Medical History:  Diagnosis Date  . Arthritis    "knees" (07/04/2017)  . Dysrhythmia    PALPITATIONS  . GERD (gastroesophageal reflux disease)   . High cholesterol   . Leg pain     Past Surgical History:  Procedure Laterality Date  . ENDOVENOUS ABLATION SAPHENOUS VEIN W/ LASER Left 2005   [facility]  . JOINT REPLACEMENT    . KNEE ARTHROSCOPY Right   . SHOULDER ARTHROSCOPY Right    "spurs"  . SHOULDER ARTHROSCOPY Right 06/24/2019   Procedure: RIGHT SHOULDER ARTHROSCOPY; CHONDROPLASTY AND DEBRIEDMENT;  Surgeon: Melrose Nakayama, MD;  Location: [facility];  Service: Orthopedics;  Laterality: Right;  . TOTAL KNEE ARTHROPLASTY Bilateral 07/03/2017   Procedure: TOTAL KNEE BILATERAL;  Surgeon: Melrose Nakayama, MD;  Location: [place];  Service: Orthopedics;  Laterality: Bilateral;  . TUBAL LIGATION    . VAGINAL HYSTERECTOMY      There were no vitals filed for this visit.  Subjective Assessment - 08/11/19 1449    Subjective  Did good throuhgout the weekend until I kept my granddaughter, that was tough. Today was a very busy day at work and my shoulder is hurting.    Currently in Pain?  Yes     Pain Score  6     Pain Location  Shoulder    Pain Orientation  Right    Pain Descriptors / Indicators  Sore    Aggravating Factors   Using    Pain Relieving Factors  Meds, ice/heat    Multiple Pain Sites  No                       [facility] Adult PT Treatment/Exercise - 08/11/19 0001      Therapeutic Activites    Therapeutic Activities  Lifting    Lifting  Knee to waist 10# 2x 6       Shoulder Exercises: Supine   Flexion  AROM;Strengthening;Right;Weights   2x6  inclined   Shoulder Flexion Weight (lbs)  1    Other Supine Exercises  Inclined circles holding 1# weight, 2x5, Vc to keep scapula pressed into mat      Shoulder Exercises: Prone   Retraction  --   1# added 2x6   Retraction Limitations  Vc for depression of scapula    Extension  Strengthening;Right;Weights    Extension Weight (lbs)  1    Extension Limitations  Vc for scap depression      Shoulder Exercises: Sidelying   External Rotation  Strengthening;Right;20 reps;Weights  External Rotation Weight (lbs)  1    ABduction  AROM;AAROM;Right;Weights   2x6   ABduction Weight (lbs)  1      Shoulder Exercises: Standing   Flexion  AAROM;Right;10 reps   Finger ladder   Other Standing Exercises  Ball up & down on wall 12 & 6  10x trhen 3 & 9 10x      Shoulder Exercises: Pulleys   Flexion  2 minutes   Pt in pain per report   Flexion Limitations  RT      Shoulder Exercises: ROM/Strengthening   UBE (Upper Arm Bike)  2 min forward L1     Other ROM/Strengthening Exercises  Walking stiff arm plank 4x length of hi lo table      Moist Heat Therapy   Number Minutes Moist Heat  15 Minutes    Moist Heat Location  Shoulder   post session              PT Short Term Goals - 07/30/19 1656      PT SHORT TERM GOAL #1   Title  Pt will be independent with initial HEP to decrease inflammation and increase ROM.    Time  2    Period  Weeks    Status  Achieved    Target Date  07/17/19      PT SHORT  TERM GOAL #2   Title  AROM Rt shoulder flexion 120    Time  4    Period  Weeks    Status  Achieved   138   Target Date  02/18/19        PT Long Term Goals - 07/30/19 1656      PT LONG TERM GOAL #3   Title  Pt will report atleast 40% improvement in her pain with sleeping.    Time  4    Period  Weeks    Status  On-going   Still painful at night.     PT LONG TERM GOAL #4   Title  Pt will have no more than 38% limitation on FOTO outcome measure    Time  4    Period  Weeks    Status  On-going   did not get to assess FOTO today     PT LONG TERM GOAL #5   Title  Pt will be able to lift 20 lb up to waist height for return to full function at work    Time  8    Period  Weeks    Status  Partially Met            Plan - 08/11/19 1524    Clinical Impression Statement  Pt reports having a good weekend pain was low and then fairly managable at work during a very busy day.Despite presenting with moderate pain in PT today pt tolerated a good deal of shoulder strength and stabilization exercises including adding some light resistance. Pt really required little to no verbal cuing for her [place] positioning today. Pt was given heat at the end of her session for post exercise soreness.    Personal Factors and Comorbidities  Time since onset of injury/illness/exacerbation;Past/Current Experience    Examination-Activity Limitations  Lift;Dressing;Sleep    Stability/Clinical Decision Making  Stable/Uncomplicated    Rehab Potential  Good    PT Frequency  Other (comment)    PT Duration  6 weeks    PT Treatment/Interventions  ADLs/Self Care Home Management;Aquatic Therapy;Electrical Stimulation;Moist Heat;Cryotherapy;Iontophoresis '4mg'$ /ml Dexamethasone;Therapeutic activities;Therapeutic  exercise;Neuromuscular re-education;Manual techniques;Dry needling;Passive range of motion;Scar mobilization;Patient/family education;Taping    PT Next Visit Plan  progression of shoulder AROM; scap strength    PT  Home Exercise Plan  [id number]    Consulted and Agree with Plan of Care  Patient       Patient will benefit from skilled therapeutic intervention in order to improve the following deficits and impairments:  Pain, Increased muscle spasms, Decreased mobility, Decreased activity tolerance, Decreased range of motion, Decreased strength, Hypomobility, Impaired flexibility, Increased edema  Visit Diagnosis: Acute pain of right shoulder  Stiffness of right shoulder, not elsewhere classified  Localized edema  Muscle weakness (generalized)  Other muscle spasm     Problem List Patient Active Problem List   Diagnosis Date Noted  . S/P TKR (total knee replacement), bilateral   . Gastroesophageal reflux disease   . Post-operative pain   . Elevated blood pressure reading   . Bilateral primary osteoarthritis of knee 07/03/2017  . Lateral epicondylitis of right elbow 04/02/2013    Donterrius Santucci, PTA 08/11/2019, 3:29 PM  [facility] [address] [place], [place], [address] Phone: [phone]   Fax:  [phone]  Name: CONSTANZA MINCY MRN: [id number] Date of Birth: December 30, 1965

## 2019-08-12 ENCOUNTER — Encounter: Payer: No Typology Code available for payment source | Admitting: Physical Therapy

## 2019-08-13 ENCOUNTER — Encounter: Payer: Self-pay | Admitting: Physical Therapy

## 2019-08-13 ENCOUNTER — Ambulatory Visit: Payer: PRIVATE HEALTH INSURANCE | Admitting: Physical Therapy

## 2019-08-13 ENCOUNTER — Other Ambulatory Visit: Payer: Self-pay

## 2019-08-13 DIAGNOSIS — M25511 Pain in right shoulder: Secondary | ICD-10-CM

## 2019-08-13 DIAGNOSIS — R600 Localized edema: Secondary | ICD-10-CM

## 2019-08-13 DIAGNOSIS — M25611 Stiffness of right shoulder, not elsewhere classified: Secondary | ICD-10-CM

## 2019-08-13 DIAGNOSIS — M6281 Muscle weakness (generalized): Secondary | ICD-10-CM

## 2019-08-13 DIAGNOSIS — M62838 Other muscle spasm: Secondary | ICD-10-CM

## 2019-08-13 NOTE — Therapy (Signed)
[facility] [address] [place], [place], [address] Phone: [phone]   Fax:  [phone]  Physical Therapy Treatment  Patient Details  Name: ALYSIANA ETHRIDGE MRN: [id number] Date of Birth: September 26, 1965 Referring Provider (PT): Melrose Nakayama, MD   Encounter Date: 08/13/2019  PT End of Session - 08/13/19 1619    Visit Number  17    Date for PT Re-Evaluation  09/12/19    Authorization Type  WC 24 total visits    Authorization Time Period  07/03/19    Authorization - Visit Number  58    Authorization - Number of Visits  24    PT Start Time  1497    PT Stop Time  1658    PT Time Calculation (min)  43 min    Activity Tolerance  Patient tolerated treatment well    Behavior During Therapy  [facility] for tasks assessed/performed       Past Medical History:  Diagnosis Date  . Arthritis    "knees" (07/04/2017)  . Dysrhythmia    PALPITATIONS  . GERD (gastroesophageal reflux disease)   . High cholesterol   . Leg pain     Past Surgical History:  Procedure Laterality Date  . ENDOVENOUS ABLATION SAPHENOUS VEIN W/ LASER Left 2005   [facility]  . JOINT REPLACEMENT    . KNEE ARTHROSCOPY Right   . SHOULDER ARTHROSCOPY Right    "spurs"  . SHOULDER ARTHROSCOPY Right 06/24/2019   Procedure: RIGHT SHOULDER ARTHROSCOPY; CHONDROPLASTY AND DEBRIEDMENT;  Surgeon: Melrose Nakayama, MD;  Location[facility];  Service: Orthopedics;  Laterality: Right;  . TOTAL KNEE ARTHROPLASTY Bilateral 07/03/2017   Procedure: TOTAL KNEE BILATERAL;  Surgeon: Melrose Nakayama, MD;  Location: [place];  Service: Orthopedics;  Laterality: Bilateral;  . TUBAL LIGATION    . VAGINAL HYSTERECTOMY      There were no vitals filed for this visit.  Subjective Assessment - 08/13/19 1622    Subjective  Felt ok after last session.    Pertinent History  B knee replacement    Currently in Pain?  Yes    Pain Score  4     Pain Location  Shoulder    Pain Orientation   Right    Pain Descriptors / Indicators  Dull;Aching    Multiple Pain Sites  No                       [facility] Adult PT Treatment/Exercise - 08/13/19 0001      Shoulder Exercises: Supine   Flexion  AROM;Strengthening;Right;Weights   2x10  inclined   Shoulder Flexion Weight (lbs)  1    Other Supine Exercises  Inclined circles holding 1# weight, 2x10, Vc to keep scapula pressed into mat      Shoulder Exercises: Sidelying   External Rotation  Strengthening;Right;20 reps;Weights    External Rotation Weight (lbs)  1    ABduction  AROM;AAROM;Right;Weights   2x10   ABduction Weight (lbs)  1      Shoulder Exercises: Standing   Flexion  AAROM;Right;10 reps   Finger ladder: adde 1#wrist wt   Row  Strengthening;Both;10 reps;Theraband    Theraband Level (Shoulder Row)  Level 2 (Red)    Other Standing Exercises  straight arm walks on hi lo table 4x    VC/TC for thoracic ext   Other Standing Exercises  Ball up & down on wall 12 & 6  10x trhen 3 & 9 10x  Shoulder Exercises: Pulleys   Flexion  2 minutes   Pt in pain per report   Flexion Limitations  RT      Shoulder Exercises: ROM/Strengthening   UBE (Upper Arm Bike)  3x3 L1.3               PT Short Term Goals - 07/30/19 1656      PT SHORT TERM GOAL #1   Title  Pt will be independent with initial HEP to decrease inflammation and increase ROM.    Time  2    Period  Weeks    Status  Achieved    Target Date  07/17/19      PT SHORT TERM GOAL #2   Title  AROM Rt shoulder flexion 120    Time  4    Period  Weeks    Status  Achieved   138   Target Date  02/18/19        PT Long Term Goals - 07/30/19 1656      PT LONG TERM GOAL #3   Title  Pt will report atleast 40% improvement in her pain with sleeping.    Time  4    Period  Weeks    Status  On-going   Still painful at night.     PT LONG TERM GOAL #4   Title  Pt will have no more than 38% limitation on FOTO outcome measure    Time  4    Period   Weeks    Status  On-going   did not get to assess FOTO today     PT LONG TERM GOAL #5   Title  Pt will be able to lift 20 lb up to waist height for return to full function at work    Time  8    Period  Weeks    Status  Partially Met            Plan - 08/13/19 1620    Clinical Impression Statement  Pt making nice progress at work in terms of pain reduction. Pt is also making nice progress with her shoulder strength in the clinic. She has done well increasing her resistance along with her sets & reps. Pt does enjoy a little intermittent or post session heat for muscle spasms.    Personal Factors and Comorbidities  Time since onset of injury/illness/exacerbation;Past/Current Experience    Examination-Activity Limitations  Lift;Dressing;Sleep    Stability/Clinical Decision Making  Stable/Uncomplicated    Rehab Potential  Good    PT Frequency  Other (comment)    PT Duration  6 weeks    PT Treatment/Interventions  ADLs/Self Care Home Management;Aquatic Therapy;Electrical Stimulation;Moist Heat;Cryotherapy;Iontophoresis 4mg/ml Dexamethasone;Therapeutic activities;Therapeutic exercise;Neuromuscular re-education;Manual techniques;Dry needling;Passive range of motion;Scar mobilization;Patient/family education;Taping    PT Next Visit Plan  progression of shoulder AROM; scap strength    PT Home Exercise Plan  [id number]    Consulted and Agree with Plan of Care  Patient       Patient will benefit from skilled therapeutic intervention in order to improve the following deficits and impairments:  Pain, Increased muscle spasms, Decreased mobility, Decreased activity tolerance, Decreased range of motion, Decreased strength, Hypomobility, Impaired flexibility, Increased edema  Visit Diagnosis: Acute pain of right shoulder  Stiffness of right shoulder, not elsewhere classified  Localized edema  Muscle weakness (generalized)  Other muscle spasm     Problem List Patient Active Problem List    Diagnosis Date Noted  . S/P TKR (total   knee replacement), bilateral   . Gastroesophageal reflux disease   . Post-operative pain   . Elevated blood pressure reading   . Bilateral primary osteoarthritis of knee 07/03/2017  . Lateral epicondylitis of right elbow 04/02/2013    Jenan Ellegood, PTA 08/13/2019, 5:01 PM  [facility] [address] [place], [place], [address] Phone: [phone]   Fax:  [phone]  Name: BEAUTY PLESS MRN: [id number] Date of Birth: January 19, 1966

## 2019-08-14 ENCOUNTER — Encounter: Payer: No Typology Code available for payment source | Admitting: Physical Therapy

## 2019-08-18 MED FILL — AMOXICILLIN 500 MG CAPSULE: 500 | 7 days supply | Qty: 28 | Fill #0

## 2019-08-19 ENCOUNTER — Encounter: Payer: Self-pay | Admitting: Physical Therapy

## 2019-08-19 ENCOUNTER — Other Ambulatory Visit: Payer: Self-pay

## 2019-08-19 ENCOUNTER — Ambulatory Visit: Payer: PRIVATE HEALTH INSURANCE | Attending: Orthopaedic Surgery | Admitting: Physical Therapy

## 2019-08-19 DIAGNOSIS — M25511 Pain in right shoulder: Secondary | ICD-10-CM | POA: Diagnosis not present

## 2019-08-19 DIAGNOSIS — M6281 Muscle weakness (generalized): Secondary | ICD-10-CM | POA: Insufficient documentation

## 2019-08-19 DIAGNOSIS — R600 Localized edema: Secondary | ICD-10-CM | POA: Diagnosis present

## 2019-08-19 DIAGNOSIS — M25611 Stiffness of right shoulder, not elsewhere classified: Secondary | ICD-10-CM | POA: Diagnosis present

## 2019-08-19 DIAGNOSIS — M62838 Other muscle spasm: Secondary | ICD-10-CM | POA: Diagnosis present

## 2019-08-19 MED FILL — CIPROFLOXACIN-DEXAMETHASONE: 0.3-0.1 | 7 days supply | Qty: 8 | Fill #0

## 2019-08-19 NOTE — Therapy (Signed)
[facility] [address] [place], [place], [address] Phone: [phone]   Fax:  [phone]  Physical Therapy Treatment  Patient Details  Name: Samantha Curtis MRN: [id number] Date of Birth: [birth year] Referring Provider (PT): Melrose Nakayama, MD   Encounter Date: 08/19/2019  PT End of Session - 08/19/19 1438    Visit Number  18    Date for PT Re-Evaluation  09/12/19    Authorization Type  WC 24 total visits    Authorization - Visit Number  18    Authorization - Number of Visits  24    PT Start Time  1540    PT Stop Time  1530    PT Time Calculation (min)  45 min    Activity Tolerance  Patient tolerated treatment well;Patient limited by pain    Behavior During Therapy  [facility] for tasks assessed/performed       Past Medical History:  Diagnosis Date  . Arthritis    "knees" (07/04/2017)  . Dysrhythmia    PALPITATIONS  . GERD (gastroesophageal reflux disease)   . High cholesterol   . Leg pain     Past Surgical History:  Procedure Laterality Date  . ENDOVENOUS ABLATION SAPHENOUS VEIN W/ LASER Left 2005    [facility]  . JOINT REPLACEMENT    . KNEE ARTHROSCOPY Right   . SHOULDER ARTHROSCOPY Right    "spurs"  . SHOULDER ARTHROSCOPY Right 06/24/2019   Procedure: RIGHT SHOULDER ARTHROSCOPY; CHONDROPLASTY AND DEBRIEDMENT;  Surgeon: Melrose Nakayama, MD;  [facility];  Service: Orthopedics;  Laterality: Right;  . TOTAL KNEE ARTHROPLASTY Bilateral 07/03/2017   Procedure: TOTAL KNEE BILATERAL;  Surgeon: Melrose Nakayama, MD;  Location: [place];  Service: Orthopedics;  Laterality: Bilateral;  . TUBAL LIGATION    . VAGINAL HYSTERECTOMY      There were no vitals filed for this visit.  Subjective Assessment - 08/19/19 1446    Subjective  Saw MD yesterday and he wants her to continue    Pertinent History  B knee replacement    Patient Stated Goals  improve strength and pain of Rt shoulder    Currently in Pain?  Yes     Pain Score  4     Pain Location  Shoulder    Pain Orientation  Right    Pain Descriptors / Indicators  Aching    Pain Type  Acute pain                       [facility] Adult PT Treatment/Exercise - 08/19/19 0001      Shoulder Exercises: Supine   Protraction  5 reps;Right    Protraction Limitations  painful on return    Flexion  AAROM;10 reps    Flexion Limitations  with noodle      Shoulder Exercises: Standing   External Rotation  AAROM;Both;10 reps    Flexion  AAROM;Right;10 reps   no wt due to pain   Extension  Strengthening;Both;10 reps;Theraband    Theraband Level (Shoulder Extension)  Level 2 (Red)    Row  Strengthening;Both;10 reps;Theraband   5 sec hold   Theraband Level (Shoulder Row)  Level 2 (Red)      Shoulder Exercises: ROM/Strengthening   UBE (Upper Arm Bike)  L 2.3 x 3 min stopped due to increased pain    Pendulum  multiple times throughout treatment in between exercises    Other ROM/Strengthening Exercises  wall wash flex/scap x 10 ea  Shoulder Exercises: Stretch   Other Shoulder Stretches  attempted doorway stretches 2x 10 sec and supine T off small noodle all increase pain      Manual Therapy   Manual Therapy  Soft tissue mobilization;Joint mobilization;Passive ROM    Joint Mobilization  Gd I/II for pain    Soft tissue mobilization  IASTM to right periscapular muscles, deltoids and biceps    Passive ROM  into ER/flex             PT Education - 08/19/19 1543    Education Details  education regarding avoiding aggravating pain by continually pushing into pain with TE and at work.    Person(s) Educated  Patient    Methods  Explanation    Comprehension  Verbalized understanding       PT Short Term Goals - 07/30/19 1656      PT SHORT TERM GOAL #1   Title  Pt will be independent with initial HEP to decrease inflammation and increase ROM.    Time  2    Period  Weeks    Status  Achieved    Target Date  07/17/19      PT SHORT  TERM GOAL #2   Title  AROM Rt shoulder flexion 120    Time  4    Period  Weeks    Status  Achieved   138   Target Date  02/18/19        PT Long Term Goals - 07/30/19 1656      PT LONG TERM GOAL #3   Title  Pt will report atleast 40% improvement in her pain with sleeping.    Time  4    Period  Weeks    Status  On-going   Still painful at night.     PT LONG TERM GOAL #4   Title  Pt will have no more than 38% limitation on FOTO outcome measure    Time  4    Period  Weeks    Status  On-going   did not get to assess FOTO today     PT LONG TERM GOAL #5   Title  Pt will be able to lift 20 lb up to waist height for return to full function at work    Time  8    Period  Weeks    Status  Partially Met            Plan - 08/19/19 1544    Clinical Impression Statement  Patient feels shoulder is progressing, but she continues to have pain with all movements of shoulder. She has muscle tightness and tenderness in periscapular muscles. She was able to tolerate IASTM without pain where manual therapy was not tolerated. Advised to try to keep exercises pain free.    PT Treatment/Interventions  ADLs/Self Care Home Management;Aquatic Therapy;Electrical Stimulation;Moist Heat;Cryotherapy;Iontophoresis '4mg'$ /ml Dexamethasone;Therapeutic activities;Therapeutic exercise;Neuromuscular re-education;Manual techniques;Dry needling;Passive range of motion;Scar mobilization;Patient/family education;Taping    PT Next Visit Plan  progression of shoulder AROM; scap strength    PT Home Exercise Plan  [facility]       Patient will benefit from skilled therapeutic intervention in order to improve the following deficits and impairments:  Pain, Increased muscle spasms, Decreased mobility, Decreased activity tolerance, Decreased range of motion, Decreased strength, Hypomobility, Impaired flexibility, Increased edema  Visit Diagnosis: Acute pain of right shoulder  Stiffness of right shoulder, not elsewhere  classified  Muscle weakness (generalized)  Other muscle spasm     Problem  List Patient Active Problem List   Diagnosis Date Noted  . S/P TKR (total knee replacement), bilateral   . Gastroesophageal reflux disease   . Post-operative pain   . Elevated blood pressure reading   . Bilateral primary osteoarthritis of knee 07/03/2017  . Lateral epicondylitis of right elbow 04/02/2013    Madelyn Flavors PT 08/19/2019, 3:47 PM  [facility] [address] [place], [place], [address] Phone: [phone]   Fax:  [phone]  Name: Samantha Curtis MRN: [id number] Date of Birth: 03-16-1966

## 2019-08-20 ENCOUNTER — Encounter: Payer: Self-pay | Admitting: Physical Therapy

## 2019-08-20 ENCOUNTER — Ambulatory Visit: Payer: PRIVATE HEALTH INSURANCE | Admitting: Physical Therapy

## 2019-08-20 ENCOUNTER — Other Ambulatory Visit: Payer: Self-pay

## 2019-08-20 DIAGNOSIS — R600 Localized edema: Secondary | ICD-10-CM

## 2019-08-20 DIAGNOSIS — M25511 Pain in right shoulder: Secondary | ICD-10-CM

## 2019-08-20 DIAGNOSIS — M6281 Muscle weakness (generalized): Secondary | ICD-10-CM

## 2019-08-20 DIAGNOSIS — M25611 Stiffness of right shoulder, not elsewhere classified: Secondary | ICD-10-CM

## 2019-08-20 DIAGNOSIS — M62838 Other muscle spasm: Secondary | ICD-10-CM

## 2019-08-20 NOTE — Therapy (Signed)
[facility] [address] [place], [place], [address] Phone: [phone]   Fax:  [phone]  Physical Therapy Treatment  Patient Details  Name: Samantha Curtis MRN: [id number] Date of Birth: 10/26/1965 Referring Provider (PT): Melrose Nakayama, MD   Encounter Date: 08/20/2019  PT End of Session - 08/20/19 1442    Visit Number  19    Date for PT Re-Evaluation  09/12/19    Authorization Type  WC 24 total visits    Authorization Time Period  07/03/19    Authorization - Visit Number  38    Authorization - Number of Visits  24    PT Start Time  8315    PT Stop Time  1520    PT Time Calculation (min)  38 min    Activity Tolerance  Patient tolerated treatment well;Patient limited by pain    Behavior During Therapy  [facility] for tasks assessed/performed       Past Medical History:  Diagnosis Date  . Arthritis    "knees" (07/04/2017)  . Dysrhythmia    PALPITATIONS  . GERD (gastroesophageal reflux disease)   . High cholesterol   . Leg pain     Past Surgical History:  Procedure Laterality Date  . ENDOVENOUS ABLATION SAPHENOUS VEIN W/ LASER Left 2005   [facility]  . JOINT REPLACEMENT    . KNEE ARTHROSCOPY Right   . SHOULDER ARTHROSCOPY Right    "spurs"  . SHOULDER ARTHROSCOPY Right 06/24/2019   Procedure: RIGHT SHOULDER ARTHROSCOPY; CHONDROPLASTY AND DEBRIEDMENT;  Surgeon: Melrose Nakayama, MD;  Location: [facility];  Service: Orthopedics;  Laterality: Right;  . TOTAL KNEE ARTHROPLASTY Bilateral 07/03/2017   Procedure: TOTAL KNEE BILATERAL;  Surgeon: Melrose Nakayama, MD;  Location: [facility];  Service: Orthopedics;  Laterality: Bilateral;  . TUBAL LIGATION    . VAGINAL HYSTERECTOMY      There were no vitals filed for this visit.  Subjective Assessment - 08/20/19 1445    Subjective  work was slow yesterday but today it was super busy.    Pertinent History  B knee replacement    Currently in Pain?  Yes    Pain Score  4     Pain Location  Shoulder    Pain Orientation  Right    Pain Descriptors / Indicators  Dull;Aching    Aggravating Factors   Use & overuse    Pain Relieving Factors  meds, heat/ice    Multiple Pain Sites  No                       [facility] Adult PT Treatment/Exercise - 08/20/19 0001      Self-Care   Self-Care  Lifting    Lifting  Instructed pt how to lift her film so she is not in an impingement position   PTA dmo, pt verbally understood     Shoulder Exercises: Supine   Flexion  Strengthening;Right;20 reps;Weights   punches   Shoulder Flexion Weight (lbs)  2      Shoulder Exercises: Prone   Extension  Strengthening;Right;20 reps;Weights    Extension Weight (lbs)  1    Horizontal ABduction 2  Strengthening;Right;10 reps    Horizontal ABduction 2 Limitations  thumb up    Other Prone Exercises  Rows 2# 2x10       Shoulder Exercises: Sidelying   External Rotation  Strengthening;Right;20 reps;Weights    External Rotation Weight (lbs)  2    ABduction  Strengthening;Right;20 reps;Weights    ABduction Weight (lbs)  1      Shoulder Exercises: Standing   Other Standing Exercises  straight arm walks on hi lo table 4x    VC/TC for thoracic ext     Shoulder Exercises: ROM/Strengthening   UBE (Upper Arm Bike)  L1 4x4  with discussion of status/MD appt      Manual Therapy   Manual Therapy  Soft tissue mobilization;Joint mobilization;Passive ROM    Soft tissue mobilization  Addaday assit to RT lats and lower traps               PT Short Term Goals - 07/30/19 1656      PT SHORT TERM GOAL #1   Title  Pt will be independent with initial HEP to decrease inflammation and increase ROM.    Time  2    Period  Weeks    Status  Achieved    Target Date  07/17/19      PT SHORT TERM GOAL #2   Title  AROM Rt shoulder flexion 120    Time  4    Period  Weeks    Status  Achieved   138   Target Date  02/18/19        PT Long Term Goals - 07/30/19 1656      PT LONG  TERM GOAL #3   Title  Pt will report atleast 40% improvement in her pain with sleeping.    Time  4    Period  Weeks    Status  On-going   Still painful at night.     PT LONG TERM GOAL #4   Title  Pt will have no more than 38% limitation on FOTO outcome measure    Time  4    Period  Weeks    Status  On-going   did not get to assess FOTO today     PT LONG TERM GOAL #5   Title  Pt will be able to lift 20 lb up to waist height for return to full function at work    Time  8    Period  Weeks    Status  Partially Met            Plan - 08/20/19 1443    Clinical Impression Statement  Pt able to tolerate an increase in weights for her shoulder strength exercices. Tactile cues required to lessen thoracic extension in sidelying. PTA educated/trouble shooted with pt how to better lift her films out of her machine so her shoulder is not in such an impinged position.    Personal Factors and Comorbidities  Time since onset of injury/illness/exacerbation;Past/Current Experience    Examination-Activity Limitations  Lift;Dressing;Sleep    Stability/Clinical Decision Making  Stable/Uncomplicated    Rehab Potential  Good    PT Frequency  Other (comment)    PT Duration  6 weeks    PT Treatment/Interventions  ADLs/Self Care Home Management;Aquatic Therapy;Electrical Stimulation;Moist Heat;Cryotherapy;Iontophoresis '4mg'$ /ml Dexamethasone;Therapeutic activities;Therapeutic exercise;Neuromuscular re-education;Manual techniques;Dry needling;Passive range of motion;Scar mobilization;Patient/family education;Taping    PT Next Visit Plan  progression of shoulder AROM; scap strength    PT Home Exercise Plan  [id number]    Consulted and Agree with Plan of Care  Patient       Patient will benefit from skilled therapeutic intervention in order to improve the following deficits and impairments:  Pain, Increased muscle spasms, Decreased mobility, Decreased activity tolerance, Decreased range of motion, Decreased  strength, Hypomobility,  Impaired flexibility, Increased edema  Visit Diagnosis: Acute pain of right shoulder  Stiffness of right shoulder, not elsewhere classified  Muscle weakness (generalized)  Other muscle spasm  Localized edema     Problem List Patient Active Problem List   Diagnosis Date Noted  . S/P TKR (total knee replacement), bilateral   . Gastroesophageal reflux disease   . Post-operative pain   . Elevated blood pressure reading   . Bilateral primary osteoarthritis of knee 07/03/2017  . Lateral epicondylitis of right elbow 04/02/2013    Salathiel Ferrara, PTA 08/20/2019, 3:22 PM  [facility] [address] [place], [place], [address] Phone: [phone]   Fax:  [phone]  Name: Samantha Curtis MRN: [id number] Date of Birth: [birth year]

## 2019-08-25 ENCOUNTER — Ambulatory Visit: Payer: PRIVATE HEALTH INSURANCE | Admitting: Physical Therapy

## 2019-08-25 ENCOUNTER — Other Ambulatory Visit: Payer: Self-pay

## 2019-08-25 ENCOUNTER — Encounter: Payer: Self-pay | Admitting: Physical Therapy

## 2019-08-25 DIAGNOSIS — M6281 Muscle weakness (generalized): Secondary | ICD-10-CM

## 2019-08-25 DIAGNOSIS — M25511 Pain in right shoulder: Secondary | ICD-10-CM | POA: Diagnosis not present

## 2019-08-25 DIAGNOSIS — M62838 Other muscle spasm: Secondary | ICD-10-CM

## 2019-08-25 DIAGNOSIS — M25611 Stiffness of right shoulder, not elsewhere classified: Secondary | ICD-10-CM

## 2019-08-25 DIAGNOSIS — R600 Localized edema: Secondary | ICD-10-CM

## 2019-08-25 MED FILL — METOPROLOL TARTRATE 25 MG T: 25 | 90 days supply | Qty: 90 | Fill #2

## 2019-08-25 MED FILL — ATORVASTATIN 10 MG TABLET: 10 | 90 days supply | Qty: 90 | Fill #2

## 2019-08-25 NOTE — Therapy (Signed)
[facility] [address] [place], [place], [address] Phone: [phone]   Fax:  [phone]  Physical Therapy Treatment  Patient Details  Name: Samantha Curtis MRN: [id number] Date of Birth: 1966/03/27 Referring Provider (PT): Melrose Nakayama, MD   Encounter Date: 08/25/2019  PT End of Session - 08/25/19 1448    Visit Number  20    Date for PT Re-Evaluation  09/12/19    Authorization Type  WC 24 total visits    Authorization Time Period  07/03/19    Authorization - Visit Number  29    Authorization - Number of Visits  24    PT Start Time  7096    PT Stop Time  1531    PT Time Calculation (min)  46 min    Activity Tolerance  Patient tolerated treatment well    Behavior During Therapy  [facility] for tasks assessed/performed       Past Medical History:  Diagnosis Date  . Arthritis    "knees" (07/04/2017)  . Dysrhythmia    PALPITATIONS  . GERD (gastroesophageal reflux disease)   . High cholesterol   . Leg pain     Past Surgical History:  Procedure Laterality Date  . ENDOVENOUS ABLATION SAPHENOUS VEIN W/ LASER Left 2005   [facility]  . JOINT REPLACEMENT    . KNEE ARTHROSCOPY Right   . SHOULDER ARTHROSCOPY Right    "spurs"  . SHOULDER ARTHROSCOPY Right 06/24/2019   Procedure: RIGHT SHOULDER ARTHROSCOPY; CHONDROPLASTY AND DEBRIEDMENT;  Surgeon: Melrose Nakayama, MD;  Location: [facility];  Service: Orthopedics;  Laterality: Right;  . TOTAL KNEE ARTHROPLASTY Bilateral 07/03/2017   Procedure: TOTAL KNEE BILATERAL;  Surgeon: Melrose Nakayama, MD;  Location: [facility];  Service: Orthopedics;  Laterality: Bilateral;  . TUBAL LIGATION    . VAGINAL HYSTERECTOMY      There were no vitals filed for this visit.                    [facility] Adult PT Treatment/Exercise - 08/25/19 0001      Shoulder Exercises: Supine   Other Supine Exercises  Body Blade ossilations with Bil UE 3x 20 sec   RTUE horizontal ossilations  2x20 pain lowering arm at end     Shoulder Exercises: Seated   Flexion  Strengthening;Right;5 reps;Weights    Flexion Weight (lbs)  1   3x5     Shoulder Exercises: Prone   Extension  Strengthening;Right;20 reps;Weights    Extension Weight (lbs)  2    Horizontal ABduction 2  Strengthening;Right;20 reps    Horizontal ABduction 2 Limitations  thumb up    Other Prone Exercises  Rows 3# 2x10      Shoulder Exercises: Sidelying   External Rotation  Strengthening;Right;15 reps;Weights   2x15   External Rotation Weight (lbs)  2    ABduction  Strengthening;Right;20 reps;Weights    ABduction Weight (lbs)  2      Shoulder Exercises: Standing   Extension  Strengthening;Both;20 reps;Theraband    Theraband Level (Shoulder Extension)  Level 3 (Green)    Extension Limitations  TC at [employer];Theraband    Theraband Level (Shoulder Row)  Level 3 (Green)    Row Limitations  VC for axial elongagtion      Shoulder Exercises: ROM/Strengthening   UBE (Upper Arm Bike)  L 1.5 3x3 with discussion about pt status    Other ROM/Strengthening Exercises  lat pull  2 plates 2x10   TC for technique on pull down     Cryotherapy   Number Minutes Cryotherapy  8 Minutes    Cryotherapy Location  Shoulder    Type of Cryotherapy  Ice pack               PT Short Term Goals - 07/30/19 1656      PT SHORT TERM GOAL #1   Title  Pt will be independent with initial HEP to decrease inflammation and increase ROM.    Time  2    Period  Weeks    Status  Achieved    Target Date  07/17/19      PT SHORT TERM GOAL #2   Title  AROM Rt shoulder flexion 120    Time  4    Period  Weeks    Status  Achieved   138   Target Date  02/18/19        PT Long Term Goals - 07/30/19 1656      PT LONG TERM GOAL #3   Title  Pt will report atleast 40% improvement in her pain with sleeping.    Time  4    Period  Weeks    Status  On-going   Still painful at night.     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 Today pt was able to add at least a 1# more on all her exercises minus any adverse pain reactions.    Personal Factors and Comorbidities  Time since onset of injury/illness/exacerbation;Past/Current Experience    Examination-Activity Limitations  Lift;Dressing;Sleep    Stability/Clinical Decision Making  Stable/Uncomplicated    Rehab Potential  Good    PT Frequency  Other (comment)    PT Duration  6 weeks    PT Treatment/Interventions  ADLs/Self Care Home Management;Aquatic Therapy;Electrical Stimulation;Moist Heat;Cryotherapy;Iontophoresis '4mg'$ /ml Dexamethasone;Therapeutic activities;Therapeutic exercise;Neuromuscular re-education;Manual techniques;Dry needling;Passive range of motion;Scar mobilization;Patient/family education;Taping    PT Next Visit Plan  progression of shoulder AROM; scap strength, MMT shoulder & scapula    PT Home Exercise Plan  [id number]    Consulted and Agree with Plan of Care  Patient       Patient will benefit from skilled therapeutic intervention in order to improve the following deficits and impairments:  Pain, Increased muscle spasms, Decreased mobility, Decreased activity tolerance, Decreased range of motion, Decreased strength, Hypomobility, Impaired flexibility, Increased edema  Visit Diagnosis: Acute pain of right shoulder  Stiffness of right shoulder, not elsewhere classified  Muscle weakness (generalized)  Other muscle spasm  Localized edema     Problem  List Patient Active Problem List   Diagnosis Date Noted  . S/P TKR (total knee replacement), bilateral   . Gastroesophageal reflux disease   .  Post-operative pain   . Elevated blood pressure reading   . Bilateral primary osteoarthritis of knee 07/03/2017  . Lateral epicondylitis of right elbow 04/02/2013    Hershey Knauer, PTA 08/25/2019, 3:24 PM  [facility] [address] [place], [place], [address] Phone: [phone]   Fax:  [phone]  Name: LOKELANI LUTES MRN: [id number] Date of Birth: Jan 28, 1966

## 2019-08-27 ENCOUNTER — Other Ambulatory Visit: Payer: Self-pay

## 2019-08-27 ENCOUNTER — Ambulatory Visit: Payer: PRIVATE HEALTH INSURANCE

## 2019-08-27 DIAGNOSIS — R600 Localized edema: Secondary | ICD-10-CM

## 2019-08-27 DIAGNOSIS — M25511 Pain in right shoulder: Secondary | ICD-10-CM

## 2019-08-27 DIAGNOSIS — M62838 Other muscle spasm: Secondary | ICD-10-CM

## 2019-08-27 DIAGNOSIS — M6281 Muscle weakness (generalized): Secondary | ICD-10-CM

## 2019-08-27 DIAGNOSIS — M25611 Stiffness of right shoulder, not elsewhere classified: Secondary | ICD-10-CM

## 2019-08-27 NOTE — Therapy (Signed)
[facility] [address], [place], [place], [address] Phone: [phone]   Fax:  [phone]  Physical Therapy Treatment  Patient Details  Name: Samantha Curtis MRN: [id number] Date of Birth: 27-Oct-1965 Referring Provider (PT): Melrose Nakayama, MD   Encounter Date: 08/27/2019  PT End of Session - 08/27/19 1526    Visit Number  21    Date for PT Re-Evaluation  09/12/19    Authorization Type  WC 24 total visits    Authorization - Visit Number  21    Authorization - Number of Visits  24    PT Start Time  4166    PT Stop Time  1534    PT Time Calculation (min)  47 min    Activity Tolerance  Patient tolerated treatment well    Behavior During Therapy  [facility] for tasks assessed/performed       Past Medical History:  Diagnosis Date  . Arthritis    "knees" (07/04/2017)  . Dysrhythmia    PALPITATIONS  . GERD (gastroesophageal reflux disease)   . High cholesterol   . Leg pain     Past Surgical History:  Procedure Laterality Date  . ENDOVENOUS ABLATION SAPHENOUS VEIN W/ LASER Left 2005   [facility]  . JOINT REPLACEMENT    . KNEE ARTHROSCOPY Right   . SHOULDER ARTHROSCOPY Right    "spurs"  . SHOULDER ARTHROSCOPY Right 06/24/2019   Procedure: RIGHT SHOULDER ARTHROSCOPY; CHONDROPLASTY AND DEBRIEDMENT;  Surgeon: Melrose Nakayama, MD;  Location: [facility];  Service: Orthopedics;  Laterality: Right;  . TOTAL KNEE ARTHROPLASTY Bilateral 07/03/2017   Procedure: TOTAL KNEE BILATERAL;  Surgeon: Melrose Nakayama, MD;  Location: [facility];  Service: Orthopedics;  Laterality: Bilateral;  . TUBAL LIGATION    . VAGINAL HYSTERECTOMY      There were no vitals filed for this visit.  Subjective Assessment - 08/27/19 1450    Subjective  Work was very busy so I was sore.    Currently in Pain?  Yes    Pain Score  5     Pain Location  Shoulder    Pain Orientation  Right    Pain Descriptors / Indicators  Dull;Aching    Pain Type  Acute  pain    Pain Onset  More than a month ago    Pain Frequency  Intermittent    Aggravating Factors   overuse at work    Pain Relieving Factors  medicine, rest, heat/ice         [facility] PT Assessment - 08/27/19 0001      AROM   Right Shoulder Flexion  140 Degrees      Strength   Right Shoulder Flexion  4-/5    Right Shoulder ABduction  4-/5    Right Shoulder Internal Rotation  4+/5    Right Shoulder External Rotation  4+/5                   [facility] Adult PT Treatment/Exercise - 08/27/19 0001      Shoulder Exercises: Supine   Other Supine Exercises  Body Blade ossilations with Bil UE 3x 20 sec   RTUE horizontal ossilations 2x20 pain lowering arm at end     Shoulder Exercises: Seated   Flexion  Strengthening;Right;5 reps;Weights    Flexion Weight (lbs)  1   3x5   Diagonals  Strengthening;Right    Diagonals Weight (lbs)  1   3x5     Shoulder Exercises: Prone  Extension  Strengthening;Right;20 reps;Weights    Extension Weight (lbs)  2    Horizontal ABduction 2  Strengthening;Right;20 reps    Horizontal ABduction 2 Limitations  thumb up    Other Prone Exercises  Rows 3# 2x10      Shoulder Exercises: Sidelying   External Rotation  Strengthening;Right;15 reps;Weights   2x15   External Rotation Weight (lbs)  2    ABduction  Strengthening;Right;20 reps;Weights    ABduction Weight (lbs)  2      Shoulder Exercises: Standing   Other Standing Exercises  cone stack to 2nd shelf  2x1 minute   fatigue with this     Shoulder Exercises: ROM/Strengthening   UBE (Upper Arm Bike)  L 1.5 3x3 with discussion about pt status      Cryotherapy   Number Minutes Cryotherapy  8 Minutes    Cryotherapy Location  Shoulder    Type of Cryotherapy  Ice pack               PT Short Term Goals - 07/30/19 1656      PT SHORT TERM GOAL #1   Title  Pt will be independent with initial HEP to decrease inflammation and increase ROM.    Time  2    Period  Weeks    Status   Achieved    Target Date  07/17/19      PT SHORT TERM GOAL #2   Title  AROM Rt shoulder flexion 120    Time  4    Period  Weeks    Status  Achieved   138   Target Date  02/18/19        PT Long Term Goals - 08/27/19 1453      PT LONG TERM GOAL #1   Title  Pt will have greater than 140 deg of active Rt shoulder flexion.    Baseline  140    Status  Achieved      PT LONG TERM GOAL #2   Title  Pt will have atleast 4/5 MMT strength of the Rt shoulder to increase her ability to reach overhead into the kitchen cabinet.    Baseline  partially met    Time  4    Period  Weeks    Status  On-going      PT LONG TERM GOAL #3   Title  Pt will report atleast 40% improvement in her pain with sleeping.    Baseline  20% better    Time  4    Period  Weeks    Status  On-going      PT LONG TERM GOAL #4   Title  Pt will have no more than 38% limitation on FOTO outcome measure    Baseline  42% limitation    Time  4    Period  Weeks    Status  On-going      PT LONG TERM GOAL #5   Title  Pt will be able to lift 20 lb up to waist height for return to full function at work    Baseline  arm close to body and guarding    Time  8    Period  Weeks    Status  On-going            Plan - 08/27/19 1517    Clinical Impression Statement  Pt is making steady gains with PT.  Pt is back to work full duty and has up to 5-6/10 Rt  shoulder pain on a busy day.  Pt with 140 degrees of Rt shoulder flexion and strength is significantly improved since evaluation. Pt continues to work on endurance as she fatigues with work tasks.  Pt continues to wake at night with pain and is not able to sleep >3 hours at a time.  FOTO is improved to 42% limitation.  Pt will continue to benefit from skilled PT to address Rt shoulder pain, weakness and endurance deficits s/p Rt shoulder surgery.    PT Frequency  2x / week    PT Duration  6 weeks    PT Treatment/Interventions  ADLs/Self Care Home Management;Aquatic  Therapy;Electrical Stimulation;Moist Heat;Cryotherapy;Iontophoresis '4mg'$ /ml Dexamethasone;Therapeutic activities;Therapeutic exercise;Neuromuscular re-education;Manual techniques;Dry needling;Passive range of motion;Scar mobilization;Patient/family education;Taping    PT Next Visit Plan  progression of shoulder AROM; scap strength    PT Home Exercise Plan  [id number]    Recommended Other Services  recert is signed    Consulted and Agree with Plan of Care  Patient       Patient will benefit from skilled therapeutic intervention in order to improve the following deficits and impairments:  Pain, Increased muscle spasms, Decreased mobility, Decreased activity tolerance, Decreased range of motion, Decreased strength, Hypomobility, Impaired flexibility, Increased edema  Visit Diagnosis: Acute pain of right shoulder  Stiffness of right shoulder, not elsewhere classified  Muscle weakness (generalized)  Other muscle spasm  Localized edema     Problem List Patient Active Problem List   Diagnosis Date Noted  . S/P TKR (total knee replacement), bilateral   . Gastroesophageal reflux disease   . Post-operative pain   . Elevated blood pressure reading   . Bilateral primary osteoarthritis of knee 07/03/2017  . Lateral epicondylitis of right elbow 04/02/2013     Sigurd Sos, PT 08/27/19 3:28 PM  [facility] [address] [place], [place], [address] Phone: [phone]   Fax:  [phone]  Name: Samantha Curtis MRN: [id number] Date of Birth: [birth year]

## 2019-09-01 ENCOUNTER — Ambulatory Visit: Payer: PRIVATE HEALTH INSURANCE | Attending: Orthopaedic Surgery | Admitting: Physical Therapy

## 2019-09-01 ENCOUNTER — Encounter: Payer: Self-pay | Admitting: Physical Therapy

## 2019-09-01 ENCOUNTER — Other Ambulatory Visit: Payer: Self-pay

## 2019-09-01 DIAGNOSIS — M25511 Pain in right shoulder: Secondary | ICD-10-CM | POA: Diagnosis not present

## 2019-09-01 DIAGNOSIS — M62838 Other muscle spasm: Secondary | ICD-10-CM | POA: Insufficient documentation

## 2019-09-01 DIAGNOSIS — M25611 Stiffness of right shoulder, not elsewhere classified: Secondary | ICD-10-CM | POA: Diagnosis present

## 2019-09-01 DIAGNOSIS — M6281 Muscle weakness (generalized): Secondary | ICD-10-CM | POA: Diagnosis present

## 2019-09-01 NOTE — Therapy (Signed)
[facility] [address] [place], [place], [address] Phone: [phone]   Fax:  [phone]  Physical Therapy Treatment  Patient Details  Name: Samantha Curtis MRN: [id number] Date of Birth: 22-Jan-1966 Referring Provider (PT): Melrose Nakayama, MD   Encounter Date: 09/01/2019  PT End of Session - 09/01/19 1446    Visit Number  22    Date for PT Re-Evaluation  09/12/19    Authorization Type  WC 24 total visits    Authorization Time Period  07/03/19    Authorization - Visit Number  77    Authorization - Number of Visits  24    PT Start Time  0045    PT Stop Time  1527    PT Time Calculation (min)  42 min    Activity Tolerance  Patient tolerated treatment well    Behavior During Therapy  [facility] for tasks assessed/performed       Past Medical History:  Diagnosis Date  . Arthritis    "knees" (07/04/2017)  . Dysrhythmia    PALPITATIONS  . GERD (gastroesophageal reflux disease)   . High cholesterol   . Leg pain     Past Surgical History:  Procedure Laterality Date  . ENDOVENOUS ABLATION SAPHENOUS VEIN W/ LASER Left 2005   [facility]  . JOINT REPLACEMENT    . KNEE ARTHROSCOPY Right   . SHOULDER ARTHROSCOPY Right    "spurs"  . SHOULDER ARTHROSCOPY Right 06/24/2019   Procedure: RIGHT SHOULDER ARTHROSCOPY; CHONDROPLASTY AND DEBRIEDMENT;  Surgeon: Melrose Nakayama, MD;  Location: [facility];  Service: Orthopedics;  Laterality: Right;  . TOTAL KNEE ARTHROPLASTY Bilateral 07/03/2017   Procedure: TOTAL KNEE BILATERAL;  Surgeon: Melrose Nakayama, MD;  Location: [facility];  Service: Orthopedics;  Laterality: Bilateral;  . TUBAL LIGATION    . VAGINAL HYSTERECTOMY      There were no vitals filed for this visit.  Subjective Assessment - 09/01/19 1447    Subjective  A pt pulled on my arm today as he was almost going to fall. My shoulder feels strained and more painful today.    Pertinent History  B knee replacement    Currently  in Pain?  Yes    Pain Score  7     Pain Location  Shoulder    Pain Orientation  Right    Pain Descriptors / Indicators  Sore    Aggravating Factors   a patient pulling on arm today    Pain Relieving Factors  meds, rest, ice    Multiple Pain Sites  No                       [facility] Adult PT Treatment/Exercise - 09/01/19 0001      Shoulder Exercises: Standing   Flexion  AAROM;Strengthening;Right;Weights    Shoulder Flexion Weight (lbs)  1 on finger ladder    Extension  Strengthening;Both;15 reps;Theraband    Theraband Level (Shoulder Extension)  Level 3 (Green)    Row  Strengthening;Both;Theraband   15x   Theraband Level (Shoulder Row)  Level 3 (Green)      Shoulder Exercises: Pulleys   Flexion  3 minutes      Shoulder Exercises: ROM/Strengthening   UBE (Upper Arm Bike)  L 1.5 alternating directions x 70mn   Pain with reverse     Moist Heat Therapy   Number Minutes Moist Heat  8 Minutes   pre tx during discussion of current status  Moist Heat Location  Shoulder      Manual Therapy   Soft tissue mobilization  RT upper trap, ant deltoid and biceps, brachiradialis    Applied Biofreeze, TP in biceps              PT Short Term Goals - 07/30/19 1656      PT SHORT TERM GOAL #1   Title  Pt will be independent with initial HEP to decrease inflammation and increase ROM.    Time  2    Period  Weeks    Status  Achieved    Target Date  07/17/19      PT SHORT TERM GOAL #2   Title  AROM Rt shoulder flexion 120    Time  4    Period  Weeks    Status  Achieved   138   Target Date  02/18/19        PT Long Term Goals - 08/27/19 1453      PT LONG TERM GOAL #1   Title  Pt will have greater than 140 deg of active Rt shoulder flexion.    Baseline  140    Status  Achieved      PT LONG TERM GOAL #2   Title  Pt will have atleast 4/5 MMT strength of the Rt shoulder to increase her ability to reach overhead into the kitchen cabinet.    Baseline  partially  met    Time  4    Period  Weeks    Status  On-going      PT LONG TERM GOAL #3   Title  Pt will report atleast 40% improvement in her pain with sleeping.    Baseline  20% better    Time  4    Period  Weeks    Status  On-going      PT LONG TERM GOAL #4   Title  Pt will have no more than 38% limitation on FOTO outcome measure    Baseline  42% limitation    Time  4    Period  Weeks    Status  On-going      PT LONG TERM GOAL #5   Title  Pt will be able to lift 20 lb up to waist height for return to full function at work    Baseline  arm close to body and guarding    Time  8    Period  Weeks    Status  On-going            Plan - 09/01/19 1446    Clinical Impression Statement  Pt had a pt pull on her Rt UE during her work shift today to keep from falling. Pt reports increased pain in her shoulder throughout the day as she continued to work. In PT we took this into consideration slowing th epace some allowing time for pt to see if the exercise felt aggrevating in nature. Pt was able to tolerate most exercises but at a slower pace and fewer reps for some.    Personal Factors and Comorbidities  Time since onset of injury/illness/exacerbation;Past/Current Experience    Examination-Activity Limitations  Lift;Dressing;Sleep    Stability/Clinical Decision Making  Stable/Uncomplicated    Rehab Potential  Good    PT Frequency  2x / week    PT Duration  6 weeks    PT Treatment/Interventions  ADLs/Self Care Home Management;Aquatic Therapy;Electrical Stimulation;Moist Heat;Cryotherapy;Iontophoresis 37m/ml Dexamethasone;Therapeutic activities;Therapeutic exercise;Neuromuscular re-education;Manual techniques;Dry needling;Passive range of  motion;Scar mobilization;Patient/family education;Taping    PT Next Visit Plan  progression of shoulder AROM; scap strength    PT Home Exercise Plan  [id number]    Consulted and Agree with Plan of Care  Patient       Patient will benefit from skilled  therapeutic intervention in order to improve the following deficits and impairments:  Pain, Increased muscle spasms, Decreased mobility, Decreased activity tolerance, Decreased range of motion, Decreased strength, Hypomobility, Impaired flexibility, Increased edema  Visit Diagnosis: Acute pain of right shoulder  Stiffness of right shoulder, not elsewhere classified  Muscle weakness (generalized)  Other muscle spasm     Problem List Patient Active Problem List   Diagnosis Date Noted  . S/P TKR (total knee replacement), bilateral   . Gastroesophageal reflux disease   . Post-operative pain   . Elevated blood pressure reading   . Bilateral primary osteoarthritis of knee 07/03/2017  . Lateral epicondylitis of right elbow 04/02/2013    Santana Edell, PTA 09/01/2019, 3:27 PM  [facility] [address], [place], [place], [address] Phone: [phone]   Fax:  [phone]  Name: Samantha Curtis MRN: [id number] Date of Birth: 1966/03/04

## 2019-09-03 ENCOUNTER — Ambulatory Visit: Payer: PRIVATE HEALTH INSURANCE

## 2019-09-03 ENCOUNTER — Other Ambulatory Visit: Payer: Self-pay

## 2019-09-03 DIAGNOSIS — M25611 Stiffness of right shoulder, not elsewhere classified: Secondary | ICD-10-CM

## 2019-09-03 DIAGNOSIS — M25511 Pain in right shoulder: Secondary | ICD-10-CM

## 2019-09-03 DIAGNOSIS — M6281 Muscle weakness (generalized): Secondary | ICD-10-CM

## 2019-09-03 DIAGNOSIS — R600 Localized edema: Secondary | ICD-10-CM

## 2019-09-03 DIAGNOSIS — M62838 Other muscle spasm: Secondary | ICD-10-CM

## 2019-09-03 NOTE — Therapy (Signed)
[facility] [address], [address] [place], [place], [address] Phone: [phone]   Fax:  [phone]  Physical Therapy Treatment  Patient Details  Name: Samantha Curtis MRN: [id number] Date of Birth: [birth year] Referring Provider (PT): Marcene Corning, MD   Encounter Date: 09/03/2019  PT End of Session - 09/03/19 1525    Visit Number  23    Date for PT Re-Evaluation  09/12/19    Authorization Type  WC 24 total visits    Authorization - Visit Number  23    Authorization - Number of Visits  24    PT Start Time  1450    PT Stop Time  1530    PT Time Calculation (min)  40 min    Activity Tolerance  Patient tolerated treatment well    Behavior During Therapy  [facility] for tasks assessed/performed       Past Medical History:  Diagnosis Date  . Arthritis    "knees" (07/04/2017)  . Dysrhythmia    PALPITATIONS  . GERD (gastroesophageal reflux disease)   . High cholesterol   . Leg pain     Past Surgical History:  Procedure Laterality Date  . ENDOVENOUS ABLATION SAPHENOUS VEIN W/ LASER Left 2005   [facility]  . JOINT REPLACEMENT    . KNEE ARTHROSCOPY Right   . SHOULDER ARTHROSCOPY Right    "spurs"  . SHOULDER ARTHROSCOPY Right 06/24/2019   Procedure: RIGHT SHOULDER ARTHROSCOPY; CHONDROPLASTY AND DEBRIEDMENT;  Surgeon: Marcene Corning, MD;  [facility];  Service: Orthopedics;  Laterality: Right;  . TOTAL KNEE ARTHROPLASTY Bilateral 07/03/2017   Procedure: TOTAL KNEE BILATERAL;  Surgeon: Marcene Corning, MD;  [facility];  Service: Orthopedics;  Laterality: Bilateral;  . TUBAL LIGATION    . VAGINAL HYSTERECTOMY      There were no vitals filed for this visit.  Subjective Assessment - 09/03/19 1452    Subjective  I am feeling better today than I was on Monday.    Currently in Pain?  Yes    Pain Score  4     Pain Location  Shoulder    Pain Orientation  Right    Pain Descriptors / Indicators  Sore                        [facility] Adult PT Treatment/Exercise - 09/03/19 0001      Shoulder Exercises: Seated   Flexion  Strengthening;Right;20 reps    Other Seated Exercises  UE ranger: flexion and circles x 10 each    Other Seated Exercises  ball roll outs 2 x10      Shoulder Exercises: Standing   Flexion  AAROM;Strengthening;Right;Weights    Shoulder Flexion Weight (lbs)  1 on finger ladder    Extension  Strengthening;Both;Theraband;20 reps    Theraband Level (Shoulder Extension)  Level 3 (Green)    Row  Strengthening;Both;Theraband;20 reps    Theraband Level (Shoulder Row)  Level 3 (Green)      Shoulder Exercises: Pulleys   Flexion  3 minutes      Shoulder Exercises: ROM/Strengthening   UBE (Upper Arm Bike)  L 1.5 alternating directions x   Pain with reverse     Cryotherapy   Number Minutes Cryotherapy  8 Minutes    Cryotherapy Location  Shoulder    Type of Cryotherapy  Ice pack               PT Short Term Goals -  07/30/19 1656      PT SHORT TERM GOAL #1   Title  Pt will be independent with initial HEP to decrease inflammation and increase ROM.    Time  2    Period  Weeks    Status  Achieved    Target Date  07/17/19      PT SHORT TERM GOAL #2   Title  AROM Rt shoulder flexion 120    Time  4    Period  Weeks    Status  Achieved   138   Target Date  02/18/19        PT Long Term Goals - 09/03/19 1456      PT LONG TERM GOAL #3   Title  Pt will report atleast 40% improvement in her pain with sleeping.    Baseline  20% better- able to sleep on Rt side now    Time  4    Period  Weeks    Status  On-going            Plan - 09/03/19 1459    Clinical Impression Statement  Pt had a pt pull on her Rt UE during her work shift this week and had a flare-up of pain. Today, pain is reduced and was able to participate in additional exercise.  Pt has returned to regular home exercise program and PT was monitored closely for symptoms today.  Pt  is doing well with increased use of Rt UE for work and home tasks.  Pt is now able to sleep on the Rt side.  Pt will continue to benefit from skilled PT to address Rt shoulder pain and functional strength s/p shoulder surgery.    PT Treatment/Interventions  ADLs/Self Care Home Management;Aquatic Therapy;Electrical Stimulation;Moist Heat;Cryotherapy;Iontophoresis 4mg /ml Dexamethasone;Therapeutic activities;Therapeutic exercise;Neuromuscular re-education;Manual techniques;Dry needling;Passive range of motion;Scar mobilization;Patient/family education;Taping    PT Next Visit Plan  progression of shoulder AROM; scap strength    PT Home Exercise Plan  [id number]    Consulted and Agree with Plan of Care  Patient       Patient will benefit from skilled therapeutic intervention in order to improve the following deficits and impairments:  Pain, Increased muscle spasms, Decreased mobility, Decreased activity tolerance, Decreased range of motion, Decreased strength, Hypomobility, Impaired flexibility, Increased edema  Visit Diagnosis: Stiffness of right shoulder, not elsewhere classified  Acute pain of right shoulder  Muscle weakness (generalized)  Other muscle spasm  Localized edema     Problem List Patient Active Problem List   Diagnosis Date Noted  . S/P TKR (total knee replacement), bilateral   . Gastroesophageal reflux disease   . Post-operative pain   . Elevated blood pressure reading   . Bilateral primary osteoarthritis of knee 07/03/2017  . Lateral epicondylitis of right elbow 04/02/2013     Sigurd Sos, PT 09/03/19 3:27 PM   [facility] [address], [place], [place], [address] Phone: [phone]   Fax:  [phone]  Name: Samantha Curtis MRN: [id number] Date of Birth: 1965/09/20

## 2019-09-08 ENCOUNTER — Ambulatory Visit: Payer: PRIVATE HEALTH INSURANCE

## 2019-09-08 ENCOUNTER — Other Ambulatory Visit: Payer: Self-pay

## 2019-09-08 DIAGNOSIS — M6281 Muscle weakness (generalized): Secondary | ICD-10-CM

## 2019-09-08 DIAGNOSIS — M25511 Pain in right shoulder: Secondary | ICD-10-CM

## 2019-09-08 DIAGNOSIS — M25611 Stiffness of right shoulder, not elsewhere classified: Secondary | ICD-10-CM

## 2019-09-08 DIAGNOSIS — M62838 Other muscle spasm: Secondary | ICD-10-CM

## 2019-09-08 NOTE — Therapy (Signed)
[facility] [address] [place], [place], [address] Phone: [phone]   Fax:  [phone]  Physical Therapy Treatment  Patient Details  Name: Samantha Curtis MRN: [id number] Date of Birth: [birth year] Referring Provider (PT): Marcene Corning, MD   Encounter Date: 09/08/2019  PT End of Session - 09/08/19 1522    Visit Number  24    Date for PT Re-Evaluation  10/20/19    Authorization Type  WC 24 total visits    Authorization - Visit Number  24    Authorization - Number of Visits  25    PT Start Time  1447    PT Stop Time  1527    PT Time Calculation (min)  40 min    Activity Tolerance  Patient limited by pain    Behavior During Therapy  [facility] for tasks assessed/performed       Past Medical History:  Diagnosis Date  . Arthritis    "knees" (07/04/2017)  . Dysrhythmia    PALPITATIONS  . GERD (gastroesophageal reflux disease)   . High cholesterol   . Leg pain     Past Surgical History:  Procedure Laterality Date  . ENDOVENOUS ABLATION SAPHENOUS VEIN W/ LASER Left 2005   [facility]  . JOINT REPLACEMENT    . KNEE ARTHROSCOPY Right   . SHOULDER ARTHROSCOPY Right    "spurs"  . SHOULDER ARTHROSCOPY Right 06/24/2019   Procedure: RIGHT SHOULDER ARTHROSCOPY; CHONDROPLASTY AND DEBRIEDMENT;  Surgeon: Marcene Corning, MD;  [facility];  Service: Orthopedics;  Laterality: Right;  . TOTAL KNEE ARTHROPLASTY Bilateral 07/03/2017   Procedure: TOTAL KNEE BILATERAL;  Surgeon: Marcene Corning, MD;  [facility];  Service: Orthopedics;  Laterality: Bilateral;  . TUBAL LIGATION    . VAGINAL HYSTERECTOMY      There were no vitals filed for this visit.      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      Cognition   Overall Cognitive Status  Within Functional Limits for tasks assessed      AROM   Right Shoulder Flexion  140 Degrees    Right Shoulder ABduction  80 Degrees   limited due to pain   Right Shoulder Internal Rotation  --   to T10   Right Shoulder External Rotation  --   C3     Strength   Right Shoulder Flexion  4-/5    Right Shoulder Internal Rotation  5/5    Right Shoulder External Rotation  4+/5                   [facility] Adult PT Treatment/Exercise - 09/08/19 0001      Shoulder Exercises: Seated   Flexion  Strengthening;Right;20 reps    Other Seated Exercises  UE ranger: flexion and circles x 10 each      Shoulder Exercises: Standing   Flexion  AAROM;Strengthening;Right;Weights    Shoulder Flexion Weight (lbs)  1 on finger ladder    Extension  Strengthening;Both;Theraband;20 reps    Theraband Level (Shoulder Extension)  Level 3 (Green)    Row  Strengthening;Both;Theraband;20 reps    Theraband Level (Shoulder Row)  Level 3 (Green)      Shoulder Exercises: Pulleys   Flexion  3 minutes      Shoulder Exercises: ROM/Strengthening   UBE (Upper Arm Bike)  L 1.5 alternating directions x   Pain with reverse     Cryotherapy   Number Minutes Cryotherapy  8 Minutes    Cryotherapy Location  Shoulder    Type of Cryotherapy  Ice pack               PT Short Term Goals - 07/30/19 1656      PT SHORT TERM GOAL #1   Title  Pt will be independent with initial HEP to decrease inflammation and increase ROM.    Time  2    Period  Weeks    Status  Achieved    Target Date  07/17/19      PT SHORT TERM GOAL #2   Title  AROM Rt shoulder flexion 120    Time  4    Period  Weeks    Status  Achieved   138   Target Date  02/18/19        PT Long Term Goals - 09/08/19 1503       PT LONG TERM GOAL #1   Title  Pt will have > or = to 155 degrees of Rt shoulder flexion and ER to T1 to improve functional use of Rt UE    Baseline  140 and C3    Time  6    Period  Weeks    Status  On-going    Target Date  10/20/19      PT LONG TERM GOAL #2   Title  Pt will have atleast 4/5 MMT strength of the Rt shoulder to increase her ability to reach overhead into the kitchen cabinet.    Baseline  4-/5 flexion with pain, unable to test abduction due to pain.  IR 5/5, ER 4+/5    Time  4    Period  Weeks    Status  On-going    Target Date  10/20/19      PT LONG TERM GOAL #3   Title  Pt will report at least 40% improvement in her pain with sleeping.    Baseline  75%    Status  Achieved    Target Date  10/20/19      PT LONG TERM GOAL #4   Title  Pt will have no more than 38% limitation on FOTO outcome measure    Baseline  50% limitation    Time  4    Period  Weeks    Status  On-going    Target Date  10/06/19      PT LONG TERM GOAL #5   Title  Pt will be able to lift 20 lb up to waist height for return to full function at work    Status  Achieved      Additional Long Term Goals   Additional Long Term Goals  Yes      PT LONG TERM GOAL #6   Title  report < or = to 2-3/10 Rt shoulder pain at the end of a work day due to improved strength and endurance    Time  6  Period  Weeks    Status  New    Target Date  10/20/19            Plan - 09/08/19 1516    Clinical Impression Statement  Pt has returned to work full duty and experiences increased pain and fatigue at the end of a work day. Pt reported 5/10   Pt with limited Rt shoulder A/ROM in all directions with pain reported at end range.  Pt with reduced Rt shoulder flexion and abduction strength.  Pt with improved sleep although reports waking 3x/night with Rt shoulder pain.  Pt will see MD on 09/16/19.  PT is recommending that pt continues with strength and flexibility exercises and continuation of PT as needed  to improve functional strength to improve Rt shoulder endurance for work tasks.    Rehab Potential  Good    PT Frequency  2x / week    PT Duration  6 weeks    PT Treatment/Interventions  ADLs/Self Care Home Management;Aquatic Therapy;Electrical Stimulation;Moist Heat;Cryotherapy;Iontophoresis 4mg /ml Dexamethasone;Therapeutic activities;Therapeutic exercise;Neuromuscular re-education;Manual techniques;Dry needling;Passive range of motion;Scar mobilization;Patient/family education;Taping    PT Next Visit Plan  progression of shoulder AROM; scap strength    PT Home Exercise Plan  [facility]    Recommended Other Services  recert sent 9/47/65       Patient will benefit from skilled therapeutic intervention in order to improve the following deficits and impairments:  Pain, Increased muscle spasms, Decreased mobility, Decreased activity tolerance, Decreased range of motion, Decreased strength, Hypomobility, Impaired flexibility, Increased edema  Visit Diagnosis: Stiffness of right shoulder, not elsewhere classified - Plan: PT plan of care cert/re-cert  Acute pain of right shoulder - Plan: PT plan of care cert/re-cert  Muscle weakness (generalized) - Plan: PT plan of care cert/re-cert  Other muscle spasm - Plan: PT plan of care cert/re-cert     Problem List Patient Active Problem List   Diagnosis Date Noted  . S/P TKR (total knee replacement), bilateral   . Gastroesophageal reflux disease   . Post-operative pain   . Elevated blood pressure reading   . Bilateral primary osteoarthritis of knee 07/03/2017  . Lateral epicondylitis of right elbow 04/02/2013     Sigurd Sos, PT 09/08/19 3:24 PM  [facility] [address], [place], [place], [address] Phone: [phone]   Fax:  [phone]  Name: Samantha Curtis MRN: [id number] Date of Birth: 10/07/1965

## 2019-09-17 ENCOUNTER — Other Ambulatory Visit (HOSPITAL_COMMUNITY): Payer: Self-pay | Admitting: Orthopedic Surgery

## 2019-09-17 MED FILL — NAPROXEN 500 MG TABLET: 500 | 30 days supply | Qty: 60 | Fill #0

## 2019-09-22 ENCOUNTER — Encounter: Payer: Self-pay | Admitting: Physical Therapy

## 2019-09-22 ENCOUNTER — Other Ambulatory Visit: Payer: Self-pay

## 2019-09-22 ENCOUNTER — Ambulatory Visit: Payer: PRIVATE HEALTH INSURANCE | Attending: Orthopaedic Surgery | Admitting: Physical Therapy

## 2019-09-22 DIAGNOSIS — M25511 Pain in right shoulder: Secondary | ICD-10-CM | POA: Insufficient documentation

## 2019-09-22 DIAGNOSIS — M6281 Muscle weakness (generalized): Secondary | ICD-10-CM | POA: Insufficient documentation

## 2019-09-22 DIAGNOSIS — M25611 Stiffness of right shoulder, not elsewhere classified: Secondary | ICD-10-CM | POA: Diagnosis not present

## 2019-09-22 DIAGNOSIS — M62838 Other muscle spasm: Secondary | ICD-10-CM | POA: Insufficient documentation

## 2019-09-22 DIAGNOSIS — R600 Localized edema: Secondary | ICD-10-CM | POA: Insufficient documentation

## 2019-09-22 NOTE — Therapy (Signed)
[facility] [address] [place], [place], [address] Phone: [phone]   Fax:  [phone]  Physical Therapy Treatment  Patient Details  Name: Samantha Curtis MRN: [id number] Date of Birth: Dec 07, 1965 Referring Provider (PT): Melrose Nakayama, MD   Encounter Date: 09/22/2019  PT End of Session - 09/22/19 1448    Visit Number  25    Date for PT Re-Evaluation  10/20/19    Authorization Time Period  10/15/19    Authorization - Visit Number  1    Authorization - Number of Visits  8    PT Start Time  9892    PT Stop Time  1527    PT Time Calculation (min)  41 min    Activity Tolerance  Patient tolerated treatment well    Behavior During Therapy  [facility] for tasks assessed/performed       Past Medical History:  Diagnosis Date  . Arthritis    "knees" (07/04/2017)  . Dysrhythmia    PALPITATIONS  . GERD (gastroesophageal reflux disease)   . High cholesterol   . Leg pain     Past Surgical History:  Procedure Laterality Date  . ENDOVENOUS ABLATION SAPHENOUS VEIN W/ LASER Left 2005   [facility]  . JOINT REPLACEMENT    . KNEE ARTHROSCOPY Right   . SHOULDER ARTHROSCOPY Right    "spurs"  . SHOULDER ARTHROSCOPY Right 06/24/2019   Procedure: RIGHT SHOULDER ARTHROSCOPY; CHONDROPLASTY AND DEBRIEDMENT;  Surgeon: Melrose Nakayama, MD;  Location: [facility];  Service: Orthopedics;  Laterality: Right;  . TOTAL KNEE ARTHROPLASTY Bilateral 07/03/2017   Procedure: TOTAL KNEE BILATERAL;  Surgeon: Melrose Nakayama, MD;  Location: [place];  Service: Orthopedics;  Laterality: Bilateral;  . TUBAL LIGATION    . VAGINAL HYSTERECTOMY      There were no vitals filed for this visit.  Subjective Assessment - 09/22/19 1447    Subjective  I saw the MD. He is pleased and thinks I am getting stronger, which i am.    Currently in Pain?  Yes    Pain Score  3     Pain Location  Shoulder    Pain Orientation  Right    Pain Descriptors / Indicators   Sore    Multiple Pain Sites  No                       [facility] Adult PT Treatment/Exercise - 09/22/19 0001      Shoulder Exercises: Seated   Horizontal ABduction  Strengthening;Both;20 reps;Theraband    Theraband Level (Shoulder Horizontal ABduction)  --   yellow band   External Rotation  Strengthening;Both;20 reps;Theraband    Theraband Level (Shoulder External Rotation)  --   yellow.    Flexion  Strengthening;Right;20 reps;Weights    Flexion Weight (lbs)  1    Flexion Limitations  TC for to lessen her protraction    Abduction  Strengthening;Right;20 reps;Weights    ABduction Weight (lbs)  1    Diagonals  --   scaption 1# 2X10   Other Seated Exercises  Overhead punch 1# 2x10      Shoulder Exercises: Standing   Other Standing Exercises  Counter top pushups 10x    Other Standing Exercises  red ball bounce on wall 30 sec 3x      Shoulder Exercises: ROM/Strengthening   UBE (Upper Arm Bike)  1.6 3x3 with discussion of MD visit      Cryotherapy   Number Minutes  Cryotherapy  8 Minutes    Cryotherapy Location  Shoulder    Type of Cryotherapy  Ice pack               PT Short Term Goals - 07/30/19 1656      PT SHORT TERM GOAL #1   Title  Pt will be independent with initial HEP to decrease inflammation and increase ROM.    Time  2    Period  Weeks    Status  Achieved    Target Date  07/17/19      PT SHORT TERM GOAL #2   Title  AROM Rt shoulder flexion 120    Time  4    Period  Weeks    Status  Achieved   138   Target Date  02/18/19        PT Long Term Goals - 09/08/19 1503      PT LONG TERM GOAL #1   Title  Pt will have > or = to 155 degrees of Rt shoulder flexion and ER to T1 to improve functional use of Rt UE    Baseline  140 and C3    Time  6    Period  Weeks    Status  On-going    Target Date  10/20/19      PT LONG TERM GOAL #2   Title  Pt will have atleast 4/5 MMT strength of the Rt shoulder to increase her ability to reach overhead  into the kitchen cabinet.    Baseline  4-/5 flexion with pain, unable to test abduction due to pain.  IR 5/5, ER 4+/5    Time  4    Period  Weeks    Status  On-going    Target Date  10/20/19      PT LONG TERM GOAL #3   Title  Pt will report at least 40% improvement in her pain with sleeping.    Baseline  75%    Status  Achieved    Target Date  10/20/19      PT LONG TERM GOAL #4   Title  Pt will have no more than 38% limitation on FOTO outcome measure    Baseline  50% limitation    Time  4    Period  Weeks    Status  On-going    Target Date  10/06/19      PT LONG TERM GOAL #5   Title  Pt will be able to lift 20 lb up to waist height for return to full function at work    Status  Achieved      Additional Long Term Goals   Additional Long Term Goals  Yes      PT LONG TERM GOAL #6   Title  report < or = to 2-3/10 Rt shoulder pain at the end of a work day due to improved strength and endurance    Time  6    Period  Weeks    Status  New    Target Date  10/20/19            Plan - 09/22/19 1449    Clinical Impression Statement  Pt presents with mild pain post work She saw MD who is pleased with her progress and strength. Pt does require VC & TC for thoracic extension to achieve neutral prior to performing her exercises. Pt had coordination difficulties bouncing ball overhead on the wall.    Personal Factors and  Comorbidities  Time since onset of injury/illness/exacerbation;Past/Current Experience    Examination-Activity Limitations  Lift;Dressing;Sleep    Stability/Clinical Decision Making  Stable/Uncomplicated    Rehab Potential  Good    PT Frequency  2x / week    PT Duration  6 weeks    PT Treatment/Interventions  ADLs/Self Care Home Management;Aquatic Therapy;Electrical Stimulation;Moist Heat;Cryotherapy;Iontophoresis 4mg /ml Dexamethasone;Therapeutic activities;Therapeutic exercise;Neuromuscular re-education;Manual techniques;Dry needling;Passive range of motion;Scar  mobilization;Patient/family education;Taping    PT Home Exercise Plan  [id number]    Consulted and Agree with Plan of Care  Patient       Patient will benefit from skilled therapeutic intervention in order to improve the following deficits and impairments:  Pain, Increased muscle spasms, Decreased mobility, Decreased activity tolerance, Decreased range of motion, Decreased strength, Hypomobility, Impaired flexibility, Increased edema  Visit Diagnosis: Stiffness of right shoulder, not elsewhere classified  Acute pain of right shoulder  Muscle weakness (generalized)  Other muscle spasm     Problem List Patient Active Problem List   Diagnosis Date Noted  . S/P TKR (total knee replacement), bilateral   . Gastroesophageal reflux disease   . Post-operative pain   . Elevated blood pressure reading   . Bilateral primary osteoarthritis of knee 07/03/2017  . Lateral epicondylitis of right elbow 04/02/2013    Bernice Mullin, PTA 09/22/2019, 3:19 PM  [facility] [address], [address] [place], [place], [place] Phone: [phone]   Fax:  [phone]  Name: ZIYONNA CHRISTNER MRN: [name] Date of Birth: February 01, 1966

## 2019-09-24 ENCOUNTER — Other Ambulatory Visit: Payer: Self-pay

## 2019-09-24 ENCOUNTER — Encounter: Payer: Self-pay | Admitting: Physical Therapy

## 2019-09-24 ENCOUNTER — Ambulatory Visit: Payer: PRIVATE HEALTH INSURANCE | Attending: Orthopaedic Surgery | Admitting: Physical Therapy

## 2019-09-24 DIAGNOSIS — M6281 Muscle weakness (generalized): Secondary | ICD-10-CM | POA: Insufficient documentation

## 2019-09-24 DIAGNOSIS — M62838 Other muscle spasm: Secondary | ICD-10-CM | POA: Diagnosis present

## 2019-09-24 DIAGNOSIS — M25611 Stiffness of right shoulder, not elsewhere classified: Secondary | ICD-10-CM | POA: Insufficient documentation

## 2019-09-24 DIAGNOSIS — M25511 Pain in right shoulder: Secondary | ICD-10-CM | POA: Diagnosis present

## 2019-09-24 NOTE — Therapy (Signed)
[facility] [address] [place], [place], [address] Phone: [phone]   Fax:  [phone]  Physical Therapy Treatment  Patient Details  Name: Samantha Curtis MRN: [id number] Date of Birth: 1965/11/30 Referring Provider (PT): Marcene Corning, MD   Encounter Date: 09/24/2019  PT End of Session - 09/24/19 1523    Visit Number  26    Date for PT Re-Evaluation  10/20/19    Authorization Time Period  10/15/19    Authorization - Visit Number  2    Authorization - Number of Visits  8    PT Start Time  1524    PT Stop Time  1610    PT Time Calculation (min)  46 min    Activity Tolerance  Patient tolerated treatment well    Behavior During Therapy  [facility] for tasks assessed/performed       Past Medical History:  Diagnosis Date  . Arthritis    "knees" (07/04/2017)  . Dysrhythmia    PALPITATIONS  . GERD (gastroesophageal reflux disease)   . High cholesterol   . Leg pain     Past Surgical History:  Procedure Laterality Date  . ENDOVENOUS ABLATION SAPHENOUS VEIN W/ LASER Left 2005   [facility]  . JOINT REPLACEMENT    . KNEE ARTHROSCOPY Right   . SHOULDER ARTHROSCOPY Right    "spurs"  . SHOULDER ARTHROSCOPY Right 06/24/2019   Procedure: RIGHT SHOULDER ARTHROSCOPY; CHONDROPLASTY AND DEBRIEDMENT;  Surgeon: Marcene Corning, MD;  [facility];  Service: Orthopedics;  Laterality: Right;  . TOTAL KNEE ARTHROPLASTY Bilateral 07/03/2017   Procedure: TOTAL KNEE BILATERAL;  Surgeon: Marcene Corning, MD;  [facility];  Service: Orthopedics;  Laterality: Bilateral;  . TUBAL LIGATION    . VAGINAL HYSTERECTOMY      There were no vitals filed for this visit.  Subjective Assessment - 09/24/19 1525    Subjective  I felt good after last session.    Pertinent History  B knee replacement    Currently in Pain?  Yes    Pain Score  4     Pain Location  Shoulder    Pain Orientation  Right    Pain Descriptors / Indicators   Aching;Sore    Multiple Pain Sites  No                       [facility] Adult PT Treatment/Exercise - 09/24/19 0001      Shoulder Exercises: Seated   Horizontal ABduction  Strengthening;Both;20 reps;Theraband    Theraband Level (Shoulder Horizontal ABduction)  --   yellow band   External Rotation  Strengthening;Both;20 reps;Theraband    Theraband Level (Shoulder External Rotation)  --   yellow.    Flexion  Strengthening;Right;20 reps;Weights    Flexion Weight (lbs)  2    Abduction  Strengthening;Right;20 reps;Weights    ABduction Weight (lbs)  1    Diagonals  --   scaption 1# 2X10   Other Seated Exercises  Overhead punch 1# 2x10      Shoulder Exercises: Prone   Extension  Strengthening;Right;20 reps;Weights    Extension Weight (lbs)  1    Horizontal ABduction 1  AROM;Strengthening;Right;20 reps;Weights   0#, 1#     Shoulder Exercises: Standing   Other Standing Exercises  Counter top pushups 10x2   TC to correct postural malalignment   Other Standing Exercises  red ball bounce on wall 30 sec 3x  Shoulder Exercises: ROM/Strengthening   UBE (Upper Arm Bike)  1.6 3x3 with discussion of MD visit      Shoulder Exercises: Stretch   Other Shoulder Stretches  Thoracic extension 10x over soft foam roll       Cryotherapy   Number Minutes Cryotherapy  10 Minutes    Cryotherapy Location  Shoulder    Type of Cryotherapy  Ice pack               PT Short Term Goals - 07/30/19 1656      PT SHORT TERM GOAL #1   Title  Pt will be independent with initial HEP to decrease inflammation and increase ROM.    Time  2    Period  Weeks    Status  Achieved    Target Date  07/17/19      PT SHORT TERM GOAL #2   Title  AROM Rt shoulder flexion 120    Time  4    Period  Weeks    Status  Achieved   138   Target Date  02/18/19        PT Long Term Goals - 09/08/19 1503      PT LONG TERM GOAL #1   Title  Pt will have > or = to 155 degrees of Rt shoulder flexion  and ER to T1 to improve functional use of Rt UE    Baseline  140 and C3    Time  6    Period  Weeks    Status  On-going    Target Date  10/20/19      PT LONG TERM GOAL #2   Title  Pt will have atleast 4/5 MMT strength of the Rt shoulder to increase her ability to reach overhead into the kitchen cabinet.    Baseline  4-/5 flexion with pain, unable to test abduction due to pain.  IR 5/5, ER 4+/5    Time  4    Period  Weeks    Status  On-going    Target Date  10/20/19      PT LONG TERM GOAL #3   Title  Pt will report at least 40% improvement in her pain with sleeping.    Baseline  75%    Status  Achieved    Target Date  10/20/19      PT LONG TERM GOAL #4   Title  Pt will have no more than 38% limitation on FOTO outcome measure    Baseline  50% limitation    Time  4    Period  Weeks    Status  On-going    Target Date  10/06/19      PT LONG TERM GOAL #5   Title  Pt will be able to lift 20 lb up to waist height for return to full function at work    Status  Achieved      Additional Long Term Goals   Additional Long Term Goals  Yes      PT LONG TERM GOAL #6   Title  report < or = to 2-3/10 Rt shoulder pain at the end of a work day due to improved strength and endurance    Time  6    Period  Weeks    Status  New    Target Date  10/20/19            Plan - 09/24/19 1524    Clinical Impression Statement  Pt tolerated  all her strengthening exercises well. She exhibits fatigue and requires a short rest break and is ready for the next exercise. Pt tolerated resistance including closed chain body weight. pt improved bouncing the ball overhead with proper scapular stabilization today. Pt required ice at the end of her session.    Personal Factors and Comorbidities  Time since onset of injury/illness/exacerbation;Past/Current Experience    Examination-Activity Limitations  Lift;Dressing;Sleep    Stability/Clinical Decision Making  Stable/Uncomplicated    Rehab Potential  Good     PT Frequency  2x / week    PT Duration  6 weeks    PT Treatment/Interventions  ADLs/Self Care Home Management;Aquatic Therapy;Electrical Stimulation;Moist Heat;Cryotherapy;Iontophoresis 4mg /ml Dexamethasone;Therapeutic activities;Therapeutic exercise;Neuromuscular re-education;Manual techniques;Dry needling;Passive range of motion;Scar mobilization;Patient/family education;Taping    PT Next Visit Plan  progression of shoulder AROM; scap strength, pt was not able to get an appt next week but is on teh wsit list.    PT Home Exercise Plan  [facility]    Consulted and Agree with Plan of Care  Patient       Patient will benefit from skilled therapeutic intervention in order to improve the following deficits and impairments:  Pain, Increased muscle spasms, Decreased mobility, Decreased activity tolerance, Decreased range of motion, Decreased strength, Hypomobility, Impaired flexibility, Increased edema  Visit Diagnosis: Stiffness of right shoulder, not elsewhere classified  Acute pain of right shoulder  Muscle weakness (generalized)  Other muscle spasm     Problem List Patient Active Problem List   Diagnosis Date Noted  . S/P TKR (total knee replacement), bilateral   . Gastroesophageal reflux disease   . Post-operative pain   . Elevated blood pressure reading   . Bilateral primary osteoarthritis of knee 07/03/2017  . Lateral epicondylitis of right elbow 04/02/2013    Shirl Weir, PTA 09/24/2019, 5:00 PM  [facility] [address] [place], [place], [place] Phone: [phone]   Fax:  [phone]  Name: Samantha Curtis MRN: [name] Date of Birth: 01-02-1966

## 2019-09-29 MED FILL — DICLOFENAC SOD EC 75 MG TAB: 75 | 30 days supply | Qty: 60 | Fill #0

## 2019-10-01 ENCOUNTER — Ambulatory Visit: Payer: PRIVATE HEALTH INSURANCE

## 2019-10-01 ENCOUNTER — Other Ambulatory Visit: Payer: Self-pay

## 2019-10-01 DIAGNOSIS — M25611 Stiffness of right shoulder, not elsewhere classified: Secondary | ICD-10-CM | POA: Diagnosis not present

## 2019-10-01 DIAGNOSIS — R600 Localized edema: Secondary | ICD-10-CM

## 2019-10-01 DIAGNOSIS — M6281 Muscle weakness (generalized): Secondary | ICD-10-CM

## 2019-10-01 DIAGNOSIS — M62838 Other muscle spasm: Secondary | ICD-10-CM

## 2019-10-01 DIAGNOSIS — M25511 Pain in right shoulder: Secondary | ICD-10-CM

## 2019-10-01 NOTE — Therapy (Signed)
[facility] [address], [place], [place], [address] Phone: [phone]   Fax:  [phone]  Physical Therapy Treatment  Patient Details  Name: Samantha Curtis MRN: [id number] Date of Birth: Jun 05, 1966 Referring Provider (PT): Melrose Nakayama, MD   Encounter Date: 10/01/2019  PT End of Session - 10/01/19 1433    Visit Number  27    Date for PT Re-Evaluation  10/20/19    Authorization Type  [facility] 8 additional    Authorization - Visit Number  3    Authorization - Number of Visits  8    PT Start Time  3710    PT Stop Time  1440    PT Time Calculation (min)  48 min    Activity Tolerance  Patient tolerated treatment well    Behavior During Therapy  [facility] for tasks assessed/performed       Past Medical History:  Diagnosis Date  . Arthritis    "knees" (07/04/2017)  . Dysrhythmia    PALPITATIONS  . GERD (gastroesophageal reflux disease)   . High cholesterol   . Leg pain     Past Surgical History:  Procedure Laterality Date  . ENDOVENOUS ABLATION SAPHENOUS VEIN W/ LASER Left 2005    [facility]  . JOINT REPLACEMENT    . KNEE ARTHROSCOPY Right   . SHOULDER ARTHROSCOPY Right    "spurs"  . SHOULDER ARTHROSCOPY Right 06/24/2019   Procedure: RIGHT SHOULDER ARTHROSCOPY; CHONDROPLASTY AND DEBRIEDMENT;  Surgeon: Melrose Nakayama, MD;  Location: [facility];  Service: Orthopedics;  Laterality: Right;  . TOTAL KNEE ARTHROPLASTY Bilateral 07/03/2017   Procedure: TOTAL KNEE BILATERAL;  Surgeon: Melrose Nakayama, MD;  Location: [place];  Service: Orthopedics;  Laterality: Bilateral;  . TUBAL LIGATION    . VAGINAL HYSTERECTOMY      There were no vitals filed for this visit.  Subjective Assessment - 10/01/19 1356    Subjective  I felt like I needed therapy today. I am using my arm at work a lot.   My Rt neck is sore.    Patient Stated Goals  improve strength and pain of Rt shoulder    Currently in Pain?  Yes    Pain Score  4     Pain Location  Shoulder    Pain Orientation  Right    Pain Descriptors / Indicators  Aching;Sore    Pain Type  Acute pain    Pain Onset  More than a month ago    Pain Frequency  Intermittent    Aggravating Factors   overuse at work, fatigue    Pain Relieving Factors  heat from shower, ice, rest, medication                       [facility] Adult PT Treatment/Exercise - 10/01/19 0001      Shoulder Exercises: Seated   Flexion  Strengthening;Right;20 reps;Weights    Flexion Weight (lbs)  2    Abduction  Strengthening;Right;20 reps;Weights    ABduction Weight (lbs)  1    Diagonals  --   scaption 1# 2X10   Other Seated Exercises  Overhead punch 1# 2x10      Shoulder Exercises: Prone   Extension  Strengthening;Right;20 reps;Weights    Extension Weight (lbs)  1      Shoulder Exercises: Standing   Other Standing Exercises  Counter top pushups 10x2   TC to correct postural malalignment     Shoulder Exercises: ROM/Strengthening  UBE (Upper Arm Bike)  1.6 3x3 with discussion of MD visit      Cryotherapy   Number Minutes Cryotherapy  10 Minutes    Cryotherapy Location  Shoulder    Type of Cryotherapy  Ice pack      Manual Therapy   Manual Therapy  Soft tissue mobilization;Joint mobilization;Passive ROM    Manual therapy comments  elongation to Rt upper trap with passive stretch into sidebending and rotation               PT Short Term Goals - 07/30/19 1656      PT SHORT TERM GOAL #1   Title  Pt will be independent with initial HEP to decrease inflammation and increase ROM.    Time  2    Period  Weeks    Status  Achieved    Target Date  07/17/19      PT SHORT TERM GOAL #2   Title  AROM Rt shoulder flexion 120    Time  4    Period  Weeks    Status  Achieved   138   Target Date  02/18/19        PT Long Term Goals - 09/08/19 1503      PT LONG TERM GOAL #1   Title  Pt will have > or = to 155 degrees of Rt shoulder flexion and ER to T1 to improve  functional use of Rt UE    Baseline  140 and C3    Time  6    Period  Weeks    Status  On-going    Target Date  10/20/19      PT LONG TERM GOAL #2   Title  Pt will have atleast 4/5 MMT strength of the Rt shoulder to increase her ability to reach overhead into the kitchen cabinet.    Baseline  4-/5 flexion with pain, unable to test abduction due to pain.  IR 5/5, ER 4+/5    Time  4    Period  Weeks    Status  On-going    Target Date  10/20/19      PT LONG TERM GOAL #3   Title  Pt will report at least 40% improvement in her pain with sleeping.    Baseline  75%    Status  Achieved    Target Date  10/20/19      PT LONG TERM GOAL #4   Title  Pt will have no more than 38% limitation on FOTO outcome measure    Baseline  50% limitation    Time  4    Period  Weeks    Status  On-going    Target Date  10/06/19      PT LONG TERM GOAL #5   Title  Pt will be able to lift 20 lb up to waist height for return to full function at work    Status  Achieved      Additional Long Term Goals   Additional Long Term Goals  Yes      PT LONG TERM GOAL #6   Title  report < or = to 2-3/10 Rt shoulder pain at the end of a work day due to improved strength and endurance    Time  6    Period  Weeks    Status  New    Target Date  10/20/19            Plan - 10/01/19 1403  Clinical Impression Statement  Pt tolerated all her strengthening exercises well today. She exhibits fatigue and requires a short rest break after each exercise. Pt tolerated resistance including closed chain body weight. Pt demonstrated improved scapular stabilization with exercise today.  Pt with tension and trigger points in the Rt upper trap and demonstrated improved tissue mobility after manual therapy today. Pt required ice at the end of her session.    PT Frequency  2x / week    PT Duration  6 weeks    PT Treatment/Interventions  ADLs/Self Care Home Management;Aquatic Therapy;Electrical Stimulation;Moist  Heat;Cryotherapy;Iontophoresis 4mg /ml Dexamethasone;Therapeutic activities;Therapeutic exercise;Neuromuscular re-education;Manual techniques;Dry needling;Passive range of motion;Scar mobilization;Patient/family education;Taping    PT Next Visit Plan  progress Rt shoulder strength, flexiblity, and scapular strength    PT Home Exercise Plan  [facility]    Recommended Other Services  recert is signed       Patient will benefit from skilled therapeutic intervention in order to improve the following deficits and impairments:  Pain, Increased muscle spasms, Decreased mobility, Decreased activity tolerance, Decreased range of motion, Decreased strength, Hypomobility, Impaired flexibility, Increased edema  Visit Diagnosis: Acute pain of right shoulder  Stiffness of right shoulder, not elsewhere classified  Muscle weakness (generalized)  Other muscle spasm  Localized edema     Problem List Patient Active Problem List   Diagnosis Date Noted  . S/P TKR (total knee replacement), bilateral   . Gastroesophageal reflux disease   . Post-operative pain   . Elevated blood pressure reading   . Bilateral primary osteoarthritis of knee 07/03/2017  . Lateral epicondylitis of right elbow 04/02/2013     04/04/2013, PT 10/01/19 2:36 PM  [facility] [address] [place], [place], [place] Phone: [phone]   Fax:  [phone]  Name: SOSHA SHEPHERD MRN: [name] Date of Birth: 1965-11-15

## 2019-10-06 ENCOUNTER — Ambulatory Visit: Payer: PRIVATE HEALTH INSURANCE | Admitting: Physical Therapy

## 2019-10-06 ENCOUNTER — Encounter: Payer: Self-pay | Admitting: Physical Therapy

## 2019-10-06 ENCOUNTER — Other Ambulatory Visit: Payer: Self-pay

## 2019-10-06 DIAGNOSIS — M25611 Stiffness of right shoulder, not elsewhere classified: Secondary | ICD-10-CM

## 2019-10-06 DIAGNOSIS — M6281 Muscle weakness (generalized): Secondary | ICD-10-CM

## 2019-10-06 DIAGNOSIS — M62838 Other muscle spasm: Secondary | ICD-10-CM

## 2019-10-06 DIAGNOSIS — M25511 Pain in right shoulder: Secondary | ICD-10-CM

## 2019-10-06 NOTE — Therapy (Signed)
[facility] [address] [place], [place], [address] Phone: [phone]   Fax:  [phone]  Physical Therapy Treatment  Patient Details  Name: Samantha Curtis MRN: [id number] Date of Birth: [birth year] Referring Provider (PT): Marcene Corning, MD   Encounter Date: 10/06/2019  PT End of Session - 10/06/19 1444    Visit Number  28    Date for PT Re-Evaluation  10/20/19    Authorization Type  WC 8 additional    Authorization Time Period  10/15/19    Authorization - Visit Number  4    Authorization - Number of Visits  8    PT Start Time  1444    PT Stop Time  1525    PT Time Calculation (min)  41 min    Activity Tolerance  Patient tolerated treatment well    Behavior During Therapy  [facility] for tasks assessed/performed       Past Medical History:  Diagnosis Date  . Arthritis    "knees" (07/04/2017)  . Dysrhythmia    PALPITATIONS  . GERD (gastroesophageal reflux disease)   . High cholesterol   . Leg pain     Past Surgical History:  Procedure Laterality Date  . ENDOVENOUS ABLATION SAPHENOUS VEIN W/ LASER Left 2005   [facility]  . JOINT REPLACEMENT    . KNEE ARTHROSCOPY Right   . SHOULDER ARTHROSCOPY Right    "spurs"  . SHOULDER ARTHROSCOPY Right 06/24/2019   Procedure: RIGHT SHOULDER ARTHROSCOPY; CHONDROPLASTY AND DEBRIEDMENT;  Surgeon: Marcene Corning, MD;  Location: [facility];  Service: Orthopedics;  Laterality: Right;  . TOTAL KNEE ARTHROPLASTY Bilateral 07/03/2017   Procedure: TOTAL KNEE BILATERAL;  Surgeon: Marcene Corning, MD;  Location: [facility];  Service: Orthopedics;  Laterality: Bilateral;  . TUBAL LIGATION    . VAGINAL HYSTERECTOMY      There were no vitals filed for this visit.  Subjective Assessment - 10/06/19 1449    Subjective  I am getting much stronger I think.    Currently in Pain?  Yes    Pain Score  3     Pain Location  Shoulder    Pain Orientation  Right    Pain Descriptors / Indicators   Dull;Aching    Aggravating Factors   Overuse with work duties, pulling a pt up in bed is the worse    Pain Relieving Factors  heat, rest    Multiple Pain Sites  No                       [facility] Adult PT Treatment/Exercise - 10/06/19 0001      Shoulder Exercises: Seated   Horizontal ABduction  Strengthening;Both;20 reps;Theraband    Theraband Level (Shoulder Horizontal ABduction)  Level 2 (Red)    External Rotation  Strengthening;Both;20 reps;Theraband    Theraband Level (Shoulder External Rotation)  Level 2 (Red)    Flexion  Strengthening;Right;Weights   2x12   Flexion Weight (lbs)  2    Abduction  Strengthening;Right;Weights   2x12   ABduction Weight (lbs)  1    Diagonals  --   2# 12x2   Other Seated Exercises  Overhead punch 2# 2x12      Shoulder Exercises: ROM/Strengthening   UBE (Upper Arm Bike)  1.6 3x3 with discussion of status    Lat Pull  --   20# 15x TC for proper sholder positioning   Cybex Press  --   Bent over rows standing  5# 2x10   Wall Pushups  --   countertop 2x10   Plank  --   Modified forearm plank 3x attempts   Plank Limitations  TC to help achieve best thoracic neutral vs significnat kyphosis      Cryotherapy   Number Minutes Cryotherapy  10 Minutes    Cryotherapy Location  Shoulder    Type of Cryotherapy  Ice pack               PT Short Term Goals - 07/30/19 1656      PT SHORT TERM GOAL #1   Title  Pt will be independent with initial HEP to decrease inflammation and increase ROM.    Time  2    Period  Weeks    Status  Achieved    Target Date  07/17/19      PT SHORT TERM GOAL #2   Title  AROM Rt shoulder flexion 120    Time  4    Period  Weeks    Status  Achieved   138   Target Date  02/18/19        PT Long Term Goals - 09/08/19 1503      PT LONG TERM GOAL #1   Title  Pt will have > or = to 155 degrees of Rt shoulder flexion and ER to T1 to improve functional use of Rt UE    Baseline  140 and C3    Time  6     Period  Weeks    Status  On-going    Target Date  10/20/19      PT LONG TERM GOAL #2   Title  Pt will have atleast 4/5 MMT strength of the Rt shoulder to increase her ability to reach overhead into the kitchen cabinet.    Baseline  4-/5 flexion with pain, unable to test abduction due to pain.  IR 5/5, ER 4+/5    Time  4    Period  Weeks    Status  On-going    Target Date  10/20/19      PT LONG TERM GOAL #3   Title  Pt will report at least 40% improvement in her pain with sleeping.    Baseline  75%    Status  Achieved    Target Date  10/20/19      PT LONG TERM GOAL #4   Title  Pt will have no more than 38% limitation on FOTO outcome measure    Baseline  50% limitation    Time  4    Period  Weeks    Status  On-going    Target Date  10/06/19      PT LONG TERM GOAL #5   Title  Pt will be able to lift 20 lb up to waist height for return to full function at work    Status  Achieved      Additional Long Term Goals   Additional Long Term Goals  Yes      PT LONG TERM GOAL #6   Title  report < or = to 2-3/10 Rt shoulder pain at the end of a work day due to improved strength and endurance    Time  6    Period  Weeks    Status  New    Target Date  10/20/19            Plan - 10/06/19 1526    Clinical Impression Statement  Pt presents  with mild pain, reports she continues to feel stronger especially with her work duties. Pt able to increase reps today, resisatnce remains challenging. added more body weight exercises like the modified plank without pain, just displayed weakness.    Personal Factors and Comorbidities  Time since onset of injury/illness/exacerbation;Past/Current Experience    Examination-Activity Limitations  Lift;Dressing;Sleep    Stability/Clinical Decision Making  Stable/Uncomplicated    Rehab Potential  Good    PT Frequency  2x / week    PT Duration  6 weeks    PT Treatment/Interventions  ADLs/Self Care Home Management;Aquatic Therapy;Electrical  Stimulation;Moist Heat;Cryotherapy;Iontophoresis 4mg /ml Dexamethasone;Therapeutic activities;Therapeutic exercise;Neuromuscular re-education;Manual techniques;Dry needling;Passive range of motion;Scar mobilization;Patient/family education;Taping    PT Next Visit Plan  progress Rt shoulder strength, flexiblity, and scapular strength    PT Home Exercise Plan  [id number]    Consulted and Agree with Plan of Care  Patient       Patient will benefit from skilled therapeutic intervention in order to improve the following deficits and impairments:  Pain, Increased muscle spasms, Decreased mobility, Decreased activity tolerance, Decreased range of motion, Decreased strength, Hypomobility, Impaired flexibility, Increased edema  Visit Diagnosis: Stiffness of right shoulder, not elsewhere classified  Acute pain of right shoulder  Muscle weakness (generalized)  Other muscle spasm     Problem List Patient Active Problem List   Diagnosis Date Noted  . S/P TKR (total knee replacement), bilateral   . Gastroesophageal reflux disease   . Post-operative pain   . Elevated blood pressure reading   . Bilateral primary osteoarthritis of knee 07/03/2017  . Lateral epicondylitis of right elbow 04/02/2013    Olivia Royse, PTA 10/06/2019, 3:32 PM   [facility] [address] [place], [place], [address] Phone: [phone]   Fax:  [phone]  Name: Samantha Curtis MRN: [id number] Date of Birth: 29-Apr-1966

## 2019-10-13 ENCOUNTER — Ambulatory Visit: Payer: PRIVATE HEALTH INSURANCE

## 2019-10-13 ENCOUNTER — Other Ambulatory Visit: Payer: Self-pay

## 2019-10-13 DIAGNOSIS — M6281 Muscle weakness (generalized): Secondary | ICD-10-CM

## 2019-10-13 DIAGNOSIS — M25511 Pain in right shoulder: Secondary | ICD-10-CM

## 2019-10-13 DIAGNOSIS — M62838 Other muscle spasm: Secondary | ICD-10-CM

## 2019-10-13 DIAGNOSIS — R600 Localized edema: Secondary | ICD-10-CM

## 2019-10-13 DIAGNOSIS — M25611 Stiffness of right shoulder, not elsewhere classified: Secondary | ICD-10-CM

## 2019-10-13 NOTE — Therapy (Signed)
[facility] [address], [address] [place], [place], [address] Phone: [phone]   Fax:  [phone]  Physical Therapy Treatment  Patient Details  Name: Samantha Curtis MRN: [id number] Date of Birth: 08/03/1965 Referring Provider (PT): Marcene Corning, MD   Encounter Date: 10/13/2019  PT End of Session - 10/13/19 1515    Visit Number  29    Date for PT Re-Evaluation  10/20/19    Authorization Type  WC 8 additional    Authorization - Visit Number  5    Authorization - Number of Visits  8    PT Start Time  1446    PT Stop Time  1525    PT Time Calculation (min)  39 min    Activity Tolerance  Patient limited by pain    Behavior During Therapy  [facility] for tasks assessed/performed       Past Medical History:  Diagnosis Date  . Arthritis    "knees" (07/04/2017)  . Dysrhythmia    PALPITATIONS  . GERD (gastroesophageal reflux disease)   . High cholesterol   . Leg pain     Past Surgical History:  Procedure Laterality Date  . ENDOVENOUS ABLATION SAPHENOUS VEIN W/ LASER Left 2005   [facility]  . JOINT REPLACEMENT    . KNEE ARTHROSCOPY Right   . SHOULDER ARTHROSCOPY Right    "spurs"  . SHOULDER ARTHROSCOPY Right 06/24/2019   Procedure: RIGHT SHOULDER ARTHROSCOPY; CHONDROPLASTY AND DEBRIEDMENT;  Surgeon: Marcene Corning, MD;  [facility];  Service: Orthopedics;  Laterality: Right;  . TOTAL KNEE ARTHROPLASTY Bilateral 07/03/2017   Procedure: TOTAL KNEE BILATERAL;  Surgeon: Marcene Corning, MD;  [facility];  Service: Orthopedics;  Laterality: Bilateral;  . TUBAL LIGATION    . VAGINAL HYSTERECTOMY      There were no vitals filed for this visit.  Subjective Assessment - 10/13/19 1445    Subjective  I am feeling good because I have haven't been at work for 4 days.    Patient Stated Goals  improve strength and pain of Rt shoulder    Currently in Pain?  Yes    Pain Score  3     Pain Location  Shoulder    Pain  Orientation  Right    Pain Descriptors / Indicators  Dull;Aching    Pain Type  Acute pain    Pain Onset  More than a month ago    Pain Frequency  Intermittent    Aggravating Factors   overuse with yardwork and home tasks, repetative use at work    Pain Relieving Factors  heat, rest                       [facility] Adult PT Treatment/Exercise - 10/13/19 0001      Shoulder Exercises: Seated   Horizontal ABduction  Strengthening;Both;20 reps;Theraband    Theraband Level (Shoulder Horizontal ABduction)  Level 2 (Red)    External Rotation  Strengthening;Both;20 reps;Theraband    Theraband Level (Shoulder External Rotation)  Level 2 (Red)      Shoulder Exercises: Standing   Extension  Strengthening;Both;20 reps    Extension Weight (lbs)  25    Other Standing Exercises  ball rolls on the wall x20 seconds CW and CCW     Other Standing Exercises  triceps extension: 25#  2x10      Shoulder Exercises: ROM/Strengthening   UBE (Upper Arm Bike)  1.6 3x3 with discussion of status  Lat Pull  --   20# close grip supination 2x10, wide pronated grip x 20   Cybex Press  --   Bent over rows standing 5# 2x10   Wall Pushups  --   countertop 2x10     Cryotherapy   Number Minutes Cryotherapy  10 Minutes    Cryotherapy Location  Shoulder    Type of Cryotherapy  Ice pack               PT Short Term Goals - 07/30/19 1656      PT SHORT TERM GOAL #1   Title  Pt will be independent with initial HEP to decrease inflammation and increase ROM.    Time  2    Period  Weeks    Status  Achieved    Target Date  07/17/19      PT SHORT TERM GOAL #2   Title  AROM Rt shoulder flexion 120    Time  4    Period  Weeks    Status  Achieved   138   Target Date  02/18/19        PT Long Term Goals - 09/08/19 1503      PT LONG TERM GOAL #1   Title  Pt will have > or = to 155 degrees of Rt shoulder flexion and ER to T1 to improve functional use of Rt UE    Baseline  140 and C3    Time   6    Period  Weeks    Status  On-going    Target Date  10/20/19      PT LONG TERM GOAL #2   Title  Pt will have atleast 4/5 MMT strength of the Rt shoulder to increase her ability to reach overhead into the kitchen cabinet.    Baseline  4-/5 flexion with pain, unable to test abduction due to pain.  IR 5/5, ER 4+/5    Time  4    Period  Weeks    Status  On-going    Target Date  10/20/19      PT LONG TERM GOAL #3   Title  Pt will report at least 40% improvement in her pain with sleeping.    Baseline  75%    Status  Achieved    Target Date  10/20/19      PT LONG TERM GOAL #4   Title  Pt will have no more than 38% limitation on FOTO outcome measure    Baseline  50% limitation    Time  4    Period  Weeks    Status  On-going    Target Date  10/06/19      PT LONG TERM GOAL #5   Title  Pt will be able to lift 20 lb up to waist height for return to full function at work    Status  Achieved      Additional Long Term Goals   Additional Long Term Goals  Yes      PT LONG TERM GOAL #6   Title  report < or = to 2-3/10 Rt shoulder pain at the end of a work day due to improved strength and endurance    Time  6    Period  Weeks    Status  New    Target Date  10/20/19            Plan - 10/13/19 1451    Clinical Impression Statement  Pt has  been off work x 4 days and has had less pain overall due to this rest.  Pt presents with mild pain, reports she continues to feel stronger especially with her work duties. 3 way raises and overhead press were elimitated today as they increase pain after therapy.  Pt continues to be challenged by current level of exercise.  Pt requires tactile and verbal cues especially with fatigue.  Pt will continue to benefit from skilled PT to improve shoulder strength and endurance needed for endurance tasks at work    Rehab Potential  Good    PT Frequency  2x / week    PT Duration  6 weeks    PT Treatment/Interventions  ADLs/Self Care Home Management;Aquatic  Therapy;Electrical Stimulation;Moist Heat;Cryotherapy;Iontophoresis 4mg /ml Dexamethasone;Therapeutic activities;Therapeutic exercise;Neuromuscular re-education;Manual techniques;Dry needling;Passive range of motion;Scar mobilization;Patient/family education;Taping    PT Next Visit Plan  progress Rt shoulder strength, flexiblity, and scapular strength.  MD note next visit (print for patient so she can take it with her)-route note also.    PT Home Exercise Plan  [id number]    Consulted and Agree with Plan of Care  Patient       Patient will benefit from skilled therapeutic intervention in order to improve the following deficits and impairments:  Pain, Increased muscle spasms, Decreased mobility, Decreased activity tolerance, Decreased range of motion, Decreased strength, Hypomobility, Impaired flexibility, Increased edema  Visit Diagnosis: Stiffness of right shoulder, not elsewhere classified  Acute pain of right shoulder  Muscle weakness (generalized)  Other muscle spasm  Localized edema     Problem List Patient Active Problem List   Diagnosis Date Noted  . S/P TKR (total knee replacement), bilateral   . Gastroesophageal reflux disease   . Post-operative pain   . Elevated blood pressure reading   . Bilateral primary osteoarthritis of knee 07/03/2017  . Lateral epicondylitis of right elbow 04/02/2013    04/04/2013, PT 10/13/19 3:17 PM  [facility] [address], [address] [place], [place], [place] Phone: [phone]   Fax:  [phone]  Name: Samantha Curtis MRN: [name] Date of Birth: February 24, 1966

## 2019-10-15 ENCOUNTER — Other Ambulatory Visit: Payer: Self-pay

## 2019-10-15 ENCOUNTER — Ambulatory Visit: Payer: PRIVATE HEALTH INSURANCE

## 2019-10-15 DIAGNOSIS — M25511 Pain in right shoulder: Secondary | ICD-10-CM

## 2019-10-15 DIAGNOSIS — M6281 Muscle weakness (generalized): Secondary | ICD-10-CM

## 2019-10-15 DIAGNOSIS — M25611 Stiffness of right shoulder, not elsewhere classified: Secondary | ICD-10-CM

## 2019-10-15 NOTE — Therapy (Signed)
[facility] [address] [place], [place], [address] Phone: [phone]   Fax:  [phone]  Physical Therapy Treatment  Patient Details  Name: Samantha Curtis MRN: [id number] Date of Birth: [birth year] Referring Provider (PT): Melrose Nakayama, MD   Encounter Date: 10/15/2019  PT End of Session - 10/15/19 1439    Visit Number  30    Date for PT Re-Evaluation  10/31/19    Authorization Type  WC 8 additional    Authorization - Visit Number  6    Authorization - Number of Visits  8    PT Start Time  1400    PT Stop Time  1444    PT Time Calculation (min)  44 min    Activity Tolerance  Patient limited by pain    Behavior During Therapy  [facility] for tasks assessed/performed       Past Medical History:  Diagnosis Date  . Arthritis    "knees" (07/04/2017)  . Dysrhythmia    PALPITATIONS  . GERD (gastroesophageal reflux disease)   . High cholesterol   . Leg pain     Past Surgical History:  Procedure Laterality Date  . ENDOVENOUS ABLATION SAPHENOUS VEIN W/ LASER Left 2005   [facility]  . JOINT REPLACEMENT    . KNEE ARTHROSCOPY Right   . SHOULDER ARTHROSCOPY Right    "spurs"  . SHOULDER ARTHROSCOPY Right 06/24/2019   Procedure: RIGHT SHOULDER ARTHROSCOPY; CHONDROPLASTY AND DEBRIEDMENT;  Surgeon: Melrose Nakayama, MD;  Location: [facility];  Service: Orthopedics;  Laterality: Right;  . TOTAL KNEE ARTHROPLASTY Bilateral 07/03/2017   Procedure: TOTAL KNEE BILATERAL;  Surgeon: Melrose Nakayama, MD;  Location: [place];  Service: Orthopedics;  Laterality: Bilateral;  . TUBAL LIGATION    . VAGINAL HYSTERECTOMY      There were no vitals filed for this visit.  Subjective Assessment - 10/15/19 1403    Subjective  Back to work this week.  Had a busy day.    Currently in Pain?  Yes    Pain Score  5     Pain Location  Shoulder    Pain Orientation  Right    Pain Descriptors / Indicators  Aching;Dull    Pain Type  Acute pain    Pain Onset  More than a month ago    Pain Frequency  Intermittent    Aggravating Factors   overuse with yardwork, home tasks, repetition at work    Pain Relieving Factors  stretches, massager, heat/ice         [facility] PT Assessment - 10/15/19 0001      Assessment   Medical Diagnosis  Rt shoulder arthroscopy    Referring Provider (PT)  Melrose Nakayama, MD    Onset Date/Surgical Date  06/24/19      Prior Function   Level of Independence  Independent    Vocation  Full time employment      Cognition   Overall Cognitive Status  Within Functional Limits for tasks assessed      Observation/Other Assessments   Focus on Therapeutic Outcomes (FOTO)   48% limitation      AROM   Right Shoulder Flexion  140 Degrees    Right Shoulder Internal Rotation  --   T11   Right Shoulder External Rotation  --   T1     Strength   Right Shoulder Flexion  4/5    Right Shoulder ABduction  4/5    Right Shoulder Internal Rotation  5/5    Right Shoulder External Rotation  5/5                   [facility] Adult PT Treatment/Exercise - 10/15/19 0001      Shoulder Exercises: Seated   Horizontal ABduction  Strengthening;Both;20 reps;Theraband    Theraband Level (Shoulder Horizontal ABduction)  Level 2 (Red)    External Rotation  Strengthening;Both;20 reps;Theraband    Theraband Level (Shoulder External Rotation)  Level 2 (Red)      Shoulder Exercises: Standing   Extension  Strengthening;Both;20 reps    Extension Weight (lbs)  25    Other Standing Exercises  ball rolls on the wall x20 seconds CW and CCW     Other Standing Exercises  triceps extension: 25#  2x10      Shoulder Exercises: ROM/Strengthening   UBE (Upper Arm Bike)  1.6 3x3 with discussion of status    Lat Pull  --   20# close grip supination 2x10, wide pronated grip x 20   Cybex Press  --   Bent over rows standing 5# 2x10   Wall Pushups  --   countertop 2x10     Cryotherapy   Number Minutes Cryotherapy  10 Minutes     Cryotherapy Location  Shoulder    Type of Cryotherapy  Ice pack               PT Short Term Goals - 07/30/19 1656      PT SHORT TERM GOAL #1   Title  Pt will be independent with initial HEP to decrease inflammation and increase ROM.    Time  2    Period  Weeks    Status  Achieved    Target Date  07/17/19      PT SHORT TERM GOAL #2   Title  AROM Rt shoulder flexion 120    Time  4    Period  Weeks    Status  Achieved   138   Target Date  02/18/19        PT Long Term Goals - 10/15/19 1404      PT LONG TERM GOAL #1   Title  Pt will have > or = to 155 degrees of Rt shoulder flexion and ER to T1 to improve functional use of Rt UE    Baseline  flexion 140 with pain, ER to T1    Time  6    Period  Weeks    Status  On-going      PT LONG TERM GOAL #2   Title  Pt will have atleast 4/5 MMT strength of the Rt shoulder to increase her ability to reach overhead into the kitchen cabinet.    Baseline  4/5 flexion with pain, abduction 4/5 with pain  IR 5/5, ER 4+/5    Status  Achieved      PT LONG TERM GOAL #3   Title  Pt will report at least 40% improvement in her pain with sleeping.    Baseline  occasionally wakes up with change of position    Status  Achieved      PT LONG TERM GOAL #4   Title  Pt will have no more than 38% limitation on FOTO outcome measure    Baseline  48% limitation    Time  4    Period  Weeks    Status  On-going      PT LONG TERM GOAL #5   Title  Pt will  be able to lift 20 lb up to waist height for return to full function at work    Baseline  needs to guard the arm but can do it    Status  Achieved      PT LONG TERM GOAL #6   Title  report < or = to 2-3/10 Rt shoulder pain at the end of a work day due to improved strength and endurance    Time  6    Period  Weeks    Status  On-going            Plan - 10/15/19 1415    Clinical Impression Statement  Pt continues to make progress s/p Rt shoulder surgery.  Pt with improved Rt shoulder  flexion and abduction (4/5) with pain and 5/5 IR and ER tested in neutral.  Pt continues to report 5-6/10 Rt shoulder pain and it is worse at the end of the work day with repetitive stress/use.  Pt is limited most with reaching overhead and out to the side.  Pt will attend 1 more week (2 sessions) and will be discharged to HEP.    PT Frequency  2x / week    PT Duration  6 weeks    PT Treatment/Interventions  ADLs/Self Care Home Management;Aquatic Therapy;Electrical Stimulation;Moist Heat;Cryotherapy;Iontophoresis 4mg /ml Dexamethasone;Therapeutic activities;Therapeutic exercise;Neuromuscular re-education;Manual techniques;Dry needling;Passive range of motion;Scar mobilization;Patient/family education;Taping    PT Next Visit Plan  1 more week probable to finalize HEP    PT Home Exercise Plan  [id number]    Consulted and Agree with Plan of Care  Patient       Patient will benefit from skilled therapeutic intervention in order to improve the following deficits and impairments:  Pain, Increased muscle spasms, Decreased mobility, Decreased activity tolerance, Decreased range of motion, Decreased strength, Hypomobility, Impaired flexibility, Increased edema  Visit Diagnosis: Stiffness of right shoulder, not elsewhere classified - Plan: PT plan of care cert/re-cert  Acute pain of right shoulder - Plan: PT plan of care cert/re-cert  Muscle weakness (generalized) - Plan: PT plan of care cert/re-cert     Problem List Patient Active Problem List   Diagnosis Date Noted  . S/P TKR (total knee replacement), bilateral   . Gastroesophageal reflux disease   . Post-operative pain   . Elevated blood pressure reading   . Bilateral primary osteoarthritis of knee 07/03/2017  . Lateral epicondylitis of right elbow 04/02/2013     04/04/2013, PT 10/15/19 2:40 PM  [facility] [address] [place], [place], [place] Phone: [phone]   Fax:   [phone]  Name: Samantha Curtis MRN: [name] Date of Birth: 1965/10/13

## 2019-10-20 ENCOUNTER — Encounter: Payer: No Typology Code available for payment source | Admitting: Physical Therapy

## 2019-10-22 ENCOUNTER — Ambulatory Visit: Payer: PRIVATE HEALTH INSURANCE | Attending: Orthopaedic Surgery

## 2019-10-22 ENCOUNTER — Other Ambulatory Visit: Payer: Self-pay

## 2019-10-22 DIAGNOSIS — M6281 Muscle weakness (generalized): Secondary | ICD-10-CM | POA: Insufficient documentation

## 2019-10-22 DIAGNOSIS — M25611 Stiffness of right shoulder, not elsewhere classified: Secondary | ICD-10-CM | POA: Diagnosis present

## 2019-10-22 DIAGNOSIS — M62838 Other muscle spasm: Secondary | ICD-10-CM | POA: Diagnosis present

## 2019-10-22 DIAGNOSIS — R600 Localized edema: Secondary | ICD-10-CM | POA: Insufficient documentation

## 2019-10-22 DIAGNOSIS — M25511 Pain in right shoulder: Secondary | ICD-10-CM | POA: Diagnosis present

## 2019-10-22 NOTE — Therapy (Addendum)
[facility] [address], [place], [place], [address] Phone: [phone]   Fax:  [phone]  Physical Therapy Treatment  Patient Details  Name: Samantha Curtis MRN: [id number] Date of Birth: 02/26/1966 Referring Provider (PT): Melrose Nakayama, MD   Encounter Date: 10/22/2019  PT End of Session - 10/22/19 1517    Visit Number  31    Date for PT Re-Evaluation  10/31/19    Authorization - Visit Number  7    Authorization - Number of Visits  8    PT Start Time  4944    PT Stop Time  1524    PT Time Calculation (min)  38 min    Activity Tolerance  Patient limited by pain    Behavior During Therapy  [facility] for tasks assessed/performed       Past Medical History:  Diagnosis Date  . Arthritis    "knees" (07/04/2017)  . Dysrhythmia    PALPITATIONS  . GERD (gastroesophageal reflux disease)   . High cholesterol   . Leg pain     Past Surgical History:  Procedure Laterality Date  . ENDOVENOUS ABLATION SAPHENOUS VEIN W/ LASER Left 2005   [facility]  . JOINT REPLACEMENT    . KNEE ARTHROSCOPY Right   . SHOULDER ARTHROSCOPY Right    "spurs"  . SHOULDER ARTHROSCOPY Right 06/24/2019   Procedure: RIGHT SHOULDER ARTHROSCOPY; CHONDROPLASTY AND DEBRIEDMENT;  Surgeon: Melrose Nakayama, MD;  Location: [facility];  Service: Orthopedics;  Laterality: Right;  . TOTAL KNEE ARTHROPLASTY Bilateral 07/03/2017   Procedure: TOTAL KNEE BILATERAL;  Surgeon: Melrose Nakayama, MD;  Location: [facility];  Service: Orthopedics;  Laterality: Bilateral;  . TUBAL LIGATION    . VAGINAL HYSTERECTOMY      There were no vitals filed for this visit.  Subjective Assessment - 10/22/19 1454    Subjective  I saw the MD and he put me back on full duty.  I'm doing OK with this.    Currently in Pain?  Yes    Pain Score  4     Pain Location  Shoulder    Pain Orientation  Right    Pain Descriptors / Indicators  Aching;Dull    Pain Type  Acute pain    Pain Onset   More than a month ago    Pain Frequency  Intermittent    Aggravating Factors   use at work, yardwork    Pain Relieving Factors  stretches, Production assistant, radio, heat/ic                       [facility] Adult PT Treatment/Exercise - 10/22/19 0001      Shoulder Exercises: Seated   Horizontal ABduction  Strengthening;Both;20 reps;Theraband    Theraband Level (Shoulder Horizontal ABduction)  Level 3 (Green)    External Rotation  Strengthening;Both;20 reps;Theraband    Theraband Level (Shoulder External Rotation)  Level 3 (Green)      Shoulder Exercises: Standing   Extension  Strengthening;Both;20 reps    Extension Weight (lbs)  25    Other Standing Exercises  ball rolls on the wall x20 seconds CW and CCW     Other Standing Exercises  triceps extension: 25#  2x10      Shoulder Exercises: ROM/Strengthening   UBE (Upper Arm Bike)  1.6 3x3 with discussion of status    Lat Pull  --   20# close grip supination 2x10, wide pronated grip x 20   Cybex Press  --  Bent over rows standing 5# 2x10   Wall Pushups  --   countertop 2x10     Cryotherapy   Number Minutes Cryotherapy  10 Minutes    Cryotherapy Location  Shoulder    Type of Cryotherapy  Ice pack               PT Short Term Goals - 07/30/19 1656      PT SHORT TERM GOAL #1   Title  Pt will be independent with initial HEP to decrease inflammation and increase ROM.    Time  2    Period  Weeks    Status  Achieved    Target Date  07/17/19      PT SHORT TERM GOAL #2   Title  AROM Rt shoulder flexion 120    Time  4    Period  Weeks    Status  Achieved   138   Target Date  02/18/19        PT Long Term Goals - 10/15/19 1404      PT LONG TERM GOAL #1   Title  Pt will have > or = to 155 degrees of Rt shoulder flexion and ER to T1 to improve functional use of Rt UE    Baseline  flexion 140 with pain, ER to T1    Time  6    Period  Weeks    Status  On-going      PT LONG TERM GOAL #2   Title  Pt will have atleast  4/5 MMT strength of the Rt shoulder to increase her ability to reach overhead into the kitchen cabinet.    Baseline  4/5 flexion with pain, abduction 4/5 with pain  IR 5/5, ER 4+/5    Status  Achieved      PT LONG TERM GOAL #3   Title  Pt will report at least 40% improvement in her pain with sleeping.    Baseline  occasionally wakes up with change of position    Status  Achieved      PT LONG TERM GOAL #4   Title  Pt will have no more than 38% limitation on FOTO outcome measure    Baseline  48% limitation    Time  4    Period  Weeks    Status  On-going      PT LONG TERM GOAL #5   Title  Pt will be able to lift 20 lb up to waist height for return to full function at work    Baseline  needs to guard the arm but can do it    Status  Achieved      PT LONG TERM GOAL #6   Title  report < or = to 2-3/10 Rt shoulder pain at the end of a work day due to improved strength and endurance    Time  6    Period  Weeks    Status  On-going            Plan - 10/22/19 1513    Clinical Impression Statement  Pt continues to make progress s/p Rt shoulder surgery.  Pt saw MD last week and removed all of her work restrictions.  Pt is doing well with this so far.   Pt continues to report 5-6/10 Rt shoulder pain and it is worse at the end of the work day with repetitive stress/use.  Pt is limited most with reaching overhead and out to the side.  Pt tolerated advancement to green theraband today and increased weight with lat pull down.  Pt will attend 1 more session to finalize HEP.    PT Frequency  2x / week    PT Duration  6 weeks    PT Treatment/Interventions  ADLs/Self Care Home Management;Aquatic Therapy;Electrical Stimulation;Moist Heat;Cryotherapy;Iontophoresis 75m/ml Dexamethasone;Therapeutic activities;Therapeutic exercise;Neuromuscular re-education;Manual techniques;Dry needling;Passive range of motion;Scar mobilization;Patient/family education;Taping    PT Next Visit Plan  1 more session-  finalize HEP    PT Home Exercise Plan  [email or web address]   Consulted and Agree with Plan of Care  Patient       Patient will benefit from skilled therapeutic intervention in order to improve the following deficits and impairments:  Pain, Increased muscle spasms, Decreased mobility, Decreased activity tolerance, Decreased range of motion, Decreased strength, Hypomobility, Impaired flexibility, Increased edema  Visit Diagnosis: Stiffness of right shoulder, not elsewhere classified  Acute pain of right shoulder  Muscle weakness (generalized)  Other muscle spasm  Localized edema     Problem List Patient Active Problem List   Diagnosis Date Noted  . S/P TKR (total knee replacement), bilateral   . Gastroesophageal reflux disease   . Post-operative pain   . Elevated blood pressure reading   . Bilateral primary osteoarthritis of knee 07/03/2017  . Lateral epicondylitis of right elbow 04/02/2013     KSigurd Sos PT 10/22/19 3:20 PM PHYSICAL THERAPY DISCHARGE SUMMARY  Visits from Start of Care: 31  Current functional level related to goals / functional outcomes: See above for current status.  Pt canceled her final PT session due to a death in her family.  Pt has a comprehensive HEP in place to address Rt shoulder strength and endurance deficits.     Remaining deficits: Rt shoulder pain with repetative use and with lifting at work.  Limited Rt shoulder strength and endurance that is improving post-op.     Education / Equipment: HEP Plan: Patient agrees to discharge.  Patient goals were partially met. Patient is being discharged due to being pleased with the current functional level.  ?????       KSigurd Sos PT 10/27/19 1:42 PM  [facility] [address] [place] [address]Phone: [phone]  Fax:  [phone] Name: Samantha DECOCKMRN: [id number] of Birth: [birth year]

## 2019-10-27 ENCOUNTER — Encounter: Payer: No Typology Code available for payment source | Admitting: Physical Therapy

## 2019-11-03 MED FILL — DICLOFENAC SOD EC 75 MG TAB: 75 | 30 days supply | Qty: 60 | Fill #1

## 2019-11-06 ENCOUNTER — Ambulatory Visit
Admission: RE | Admit: 2019-11-06 | Discharge: 2019-11-06 | Disposition: A | Discharge status: Home / Self Care | Payer: No Typology Code available for payment source | Source: Ambulatory Visit | Attending: Physician Assistant | Admitting: Physician Assistant

## 2019-11-06 ENCOUNTER — Other Ambulatory Visit: Payer: Self-pay | Admitting: Physician Assistant

## 2019-11-06 DIAGNOSIS — R0602 Shortness of breath: Secondary | ICD-10-CM

## 2019-11-13 MED FILL — PNEUMOVAX 23 SYRINGE: 25 | 1 days supply | Qty: 1 | Fill #0

## 2019-11-18 MED FILL — METOPROLOL TARTRATE 25 MG T: 25 | 90 days supply | Qty: 90 | Fill #3

## 2019-11-21 ENCOUNTER — Ambulatory Visit
Admission: RE | Admit: 2019-11-21 | Discharge: 2019-11-21 | Disposition: A | Discharge status: Home / Self Care | Payer: No Typology Code available for payment source | Source: Ambulatory Visit | Attending: Physician Assistant | Admitting: Physician Assistant

## 2019-11-21 ENCOUNTER — Other Ambulatory Visit: Payer: Self-pay | Admitting: Physician Assistant

## 2019-11-21 DIAGNOSIS — J189 Pneumonia, unspecified organism: Secondary | ICD-10-CM

## 2019-11-21 MED FILL — ALBUTEROL SULFATE HFA 108 (: 108 (90 BAS | 30 days supply | Qty: 9 | Fill #0

## 2019-11-21 MED FILL — HYDROCODONE-CHLORPHEN ER SU: 10-8 | 5 days supply | Qty: 50 | Fill #0

## 2019-12-02 MED FILL — ATORVASTATIN CALCIUM 10 MG: 10 | 90 days supply | Qty: 90 | Fill #3

## 2020-01-01 MED FILL — PANTOPRAZOLE SOD DR 40 MG T: 40 | 30 days supply | Qty: 60 | Fill #0

## 2020-01-02 ENCOUNTER — Ambulatory Visit (HOSPITAL_COMMUNITY)
Admission: RE | Admit: 2020-01-02 | Discharge: 2020-01-02 | Disposition: A | Discharge status: Home / Self Care | Payer: No Typology Code available for payment source | Source: Ambulatory Visit | Attending: Physician Assistant | Admitting: Physician Assistant

## 2020-01-02 ENCOUNTER — Other Ambulatory Visit: Payer: Self-pay

## 2020-01-02 ENCOUNTER — Other Ambulatory Visit (HOSPITAL_COMMUNITY): Payer: Self-pay | Admitting: Physician Assistant

## 2020-01-02 DIAGNOSIS — R1013 Epigastric pain: Secondary | ICD-10-CM

## 2020-01-06 MED FILL — TETRACYCLINE HCL 500 MG CAP: 500 | 14 days supply | Qty: 56 | Fill #0

## 2020-01-06 MED FILL — metroNIDAZOLE 250 MG TABS: 250 | 14 days supply | Qty: 56 | Fill #0

## 2020-02-16 ENCOUNTER — Other Ambulatory Visit (HOSPITAL_COMMUNITY): Payer: Self-pay | Admitting: Internal Medicine

## 2020-02-16 MED FILL — AMOX-CLAV 875-125 MG TABLET: 875-125 | 30 days supply | Qty: 60 | Fill #0

## 2020-02-16 MED FILL — METOPROLOL TARTRATE 25 MG T: 25 | 90 days supply | Qty: 90 | Fill #0

## 2020-03-05 ENCOUNTER — Other Ambulatory Visit (HOSPITAL_COMMUNITY): Payer: Self-pay | Admitting: Internal Medicine

## 2020-03-05 MED FILL — ATORVASTATIN CALCIUM 10 MG: 10 | 90 days supply | Qty: 90 | Fill #0

## 2020-03-09 ENCOUNTER — Ambulatory Visit: Payer: PRIVATE HEALTH INSURANCE | Attending: Orthopaedic Surgery | Admitting: Physical Therapy

## 2020-03-09 ENCOUNTER — Other Ambulatory Visit: Payer: Self-pay

## 2020-03-09 DIAGNOSIS — R600 Localized edema: Secondary | ICD-10-CM | POA: Insufficient documentation

## 2020-03-09 DIAGNOSIS — M62838 Other muscle spasm: Secondary | ICD-10-CM | POA: Diagnosis present

## 2020-03-09 DIAGNOSIS — M25611 Stiffness of right shoulder, not elsewhere classified: Secondary | ICD-10-CM | POA: Insufficient documentation

## 2020-03-09 DIAGNOSIS — M6281 Muscle weakness (generalized): Secondary | ICD-10-CM | POA: Insufficient documentation

## 2020-03-09 DIAGNOSIS — M25511 Pain in right shoulder: Secondary | ICD-10-CM | POA: Insufficient documentation

## 2020-03-09 NOTE — Therapy (Addendum)
[facility] [address] [place], [place], [address] Phone: [phone]   Fax:  [phone]  Physical Therapy Evaluation  Patient Details  Name: Samantha Curtis MRN: [id number] Date of Birth: 01-27-1966 Referring Provider (PT): Marcene Corning, MD   Encounter Date: 03/09/2020   PT End of Session - 03/09/20 1611    Visit Number 1    Date for PT Re-Evaluation 06/01/20    PT Start Time 1401    PT Stop Time 1443    PT Time Calculation (min) 42 min    Activity Tolerance Patient tolerated treatment well    Behavior During Therapy [facility] for tasks assessed/performed           Past Medical History:  Diagnosis Date  . Arthritis    "knees" (07/04/2017)  . Dysrhythmia    PALPITATIONS  . GERD (gastroesophageal reflux disease)   . High cholesterol   . Leg pain     Past Surgical History:  Procedure Laterality Date  . ENDOVENOUS ABLATION SAPHENOUS VEIN W/ LASER Left 2005   [facility]  . JOINT REPLACEMENT    . KNEE ARTHROSCOPY Right   . SHOULDER ARTHROSCOPY Right    "spurs"  . SHOULDER ARTHROSCOPY Right 06/24/2019   Procedure: RIGHT SHOULDER ARTHROSCOPY; CHONDROPLASTY AND DEBRIEDMENT;  Surgeon: Marcene Corning, MD;  [facility];  Service: Orthopedics;  Laterality: Right;  . TOTAL KNEE ARTHROPLASTY Bilateral 07/03/2017   Procedure: TOTAL KNEE BILATERAL;  Surgeon: Marcene Corning, MD;  [facility];  Service: Orthopedics;  Laterality: Bilateral;  . TUBAL LIGATION    . VAGINAL HYSTERECTOMY      There were no vitals filed for this visit.    Subjective Assessment - 03/09/20 1420    Subjective Pt was was helping a pt turn and the patient pulled her Rt arm.  Pt states it has been sore ever since.    Limitations Sitting    Patient Stated Goals stop hurting    Currently in Pain? 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, Surveyor, minerals on it at night, sitting at night    Pain Relieving Factors hot water in the shower, ice, napraxen or Tylenol    Multiple Pain Sites No              [facility] PT Assessment - 03/10/20 0001      Assessment   Medical Diagnosis Reinjury s/p right shoulder arthroscopy    Referring Provider (PT) Marcene Corning, MD    Onset Date/Surgical Date --   August   Hand Dominance Right    Prior Therapy No      Precautions   Precautions None      Restrictions   Weight Bearing Restrictions No      Balance Screen   Has the patient fallen in the past 6 months No      Home Environment   Living Environment Private residence    Living Arrangements Spouse/significant other      Prior Function   Level of Independence Independent  Vocation Full time employment      Cognition   Overall Cognitive Status Within Functional Limits for tasks assessed      Observation/Other Assessments   Focus on Therapeutic Outcomes (FOTO)  24% limited      Posture/Postural Control   Posture/Postural Control Postural limitations    Postural Limitations Rounded Shoulders;Increased thoracic kyphosis      ROM / Strength   AROM / PROM / Strength AROM;PROM;Strength      AROM   AROM Assessment Site Shoulder    Right/Left Shoulder Right;Left    Right Shoulder Flexion 140 Degrees    Right Shoulder ABduction 150 Degrees    Right Shoulder Internal Rotation --   to T9-8     PROM   PROM Assessment Site Shoulder    Right/Left Shoulder Right    Right Shoulder Internal Rotation 30 Degrees    Right Shoulder External Rotation 75 Degrees      Strength   Overall Strength Comments 5/5 bilat shoulders +pain Rt shoulder flexion      Palpation   Palpation comment Rt  side: tight pec minor, subclavius; upper trap; bicep, tricep; TTP acromion process and long head of bicep tendon      Special Tests    Special Tests Rotator Cuff Impingement;Biceps/Labral Tests    Rotator Cuff Impingment tests Neer impingement test;Hawkins- Kennedy test      Neer Impingement test    Findings Positive    Side Right      Hawkins-Kennedy test   Findings Negative    Side Right                      Objective measurements completed on examination: See above findings.       [facility] Adult PT Treatment/Exercise - 03/10/20 0001      Self-Care   Self-Care Other Self-Care Comments    Other Self-Care Comments  intitial HEP educated and performed      Manual Therapy   Manual therapy comments Rt upper trap and pec minor release                    PT Short Term Goals - 03/10/20 1524      PT SHORT TERM GOAL #1   Title Pt will be independent with initial HEP to decrease inflammation and increase ROM.    Time 2    Period Weeks    Status New    Target Date 03/24/20      PT SHORT TERM GOAL #2   Title AROM Rt shoulder flexion 150    Baseline 140    Time 4    Period Weeks    Status New    Target Date 04/07/20             PT Long Term Goals - 03/10/20 1525      PT LONG TERM GOAL #1   Title Pt will have IR to at least 50 deg    Baseline 30 deg    Time 8    Period Weeks    Status New    Target Date 05/05/20      PT LONG TERM GOAL #2   Title Pt will be able to do overhead activities for at least 10 minutes without pain so she can do household activities    Time 8    Period Weeks    Status New    Target Date 05/05/20      PT LONG TERM  GOAL #3   Title Pt will be able to sleep on the Rt shoulder without increased pain of more than 3/10    Baseline cannot be on Rt side at all - 9/10    Time 8    Period Weeks    Status New    Target Date 05/05/20      PT LONG TERM GOAL #4   Title Pt will be able to complete full shift at work with  no more than 3/10 NPRS    Baseline 5-8/10    Time 8    Period Weeks    Status New    Target Date 05/05/20      PT LONG TERM GOAL #5   Title .Marland KitchenMarland Kitchen                  Plan - 03/10/20 1504    Clinical Impression Statement Pt presents to skilled PT due to reinjury of shoulder s/p rt shoulder arthroscopy.  Pt has limited ROM flexion, abduction, and IR.  She has muscle spasm throughout shoulder girdle on the right side including pec minor, upper traps, bicep, triceps.  Pt is TTP long head of bicep and acromion process on the Rt side.  She has 5/5 muscle test throughout.  Posture demonstrates increased thoracic kyphosis and rounded shoulders.  Pt will benefit from skilled PT to address impairments and help her safely perform tasks at work without pain.    Personal Factors and Comorbidities Time since onset of injury/illness/exacerbation;Past/Current Experience    Examination-Activity Limitations Caring for Others;Reach Overhead    Examination-Participation Restrictions Occupation    Stability/Clinical Decision Making Stable/Uncomplicated    Clinical Decision Making Low    Rehab Potential Excellent    PT Frequency 2x / week    PT Duration 8 weeks    PT Treatment/Interventions ADLs/Self Care Home Management;Cryotherapy;Electrical Stimulation;Iontophoresis 4mg /ml Dexamethasone;Moist Heat;Ultrasound;Therapeutic activities;Therapeutic exercise;Neuromuscular re-education;Manual techniques;Patient/family education;Taping;Dry needling;Passive range of motion    PT Next Visit Plan STM/DN upper traps, bicep, tricep, deltoids; thoracic extension; shoulder flex,scap, abduction AROM to 90 for strengthening with cues for improved posture    PT Home Exercise Plan Access Code: [email or web address]    Consulted and Agree with Plan of Care Patient           Patient will benefit from skilled therapeutic intervention in order to improve the following deficits and impairments:  Decreased range of motion, Increased  fascial restricitons, Pain, Postural dysfunction, Decreased strength, Impaired flexibility, Impaired UE functional use  Visit Diagnosis: Stiffness of right shoulder, not elsewhere classified  Acute pain of right shoulder  Muscle weakness (generalized)  Other muscle spasm  Localized edema     Problem List Patient Active Problem List   Diagnosis Date Noted  . S/P TKR (total knee replacement), bilateral   . Gastroesophageal reflux disease   . Post-operative pain   . Elevated blood pressure reading   . Bilateral primary osteoarthritis of knee 07/03/2017  . Lateral epicondylitis of right elbow 04/02/2013    04/04/2013, PT 03/10/2020, 4:49 PM  [facility] [address] [place], [place], [place] Phone: [phone]   Fax:  [phone]  Name: Samantha Curtis MRN: [name] Date of Birth: [birth year]

## 2020-03-10 NOTE — Patient Instructions (Signed)
Access Code: [email or web address] URL: [email or web address] Date: 03/10/2020 Prepared by: Dwana Curd  Exercises Corner Pec Minor Stretch - 1 x daily - 7 x weekly - 3 sets - 10 reps Seated Gentle Upper Trapezius Stretch - 1 x daily - 7 x weekly - 3 reps - 1 sets - 30 sec hold Seated Bilateral Shoulder Flexion Towel Slide at Table Top - 1 x daily - 7 x weekly - 3 sets - 10 reps

## 2020-03-10 NOTE — Addendum Note (Signed)
Addended by: Beatris Si on: 03/10/2020 04:51 PM   Modules accepted: Orders

## 2020-03-17 ENCOUNTER — Ambulatory Visit: Payer: PRIVATE HEALTH INSURANCE | Admitting: Physical Therapy

## 2020-03-17 ENCOUNTER — Other Ambulatory Visit: Payer: Self-pay

## 2020-03-17 ENCOUNTER — Encounter: Payer: Self-pay | Admitting: Physical Therapy

## 2020-03-17 DIAGNOSIS — M6281 Muscle weakness (generalized): Secondary | ICD-10-CM

## 2020-03-17 DIAGNOSIS — M25611 Stiffness of right shoulder, not elsewhere classified: Secondary | ICD-10-CM

## 2020-03-17 DIAGNOSIS — M62838 Other muscle spasm: Secondary | ICD-10-CM

## 2020-03-17 DIAGNOSIS — M25511 Pain in right shoulder: Secondary | ICD-10-CM

## 2020-03-17 NOTE — Therapy (Signed)
[facility] [address] [place], [place], [address] Phone: [phone]   Fax:  [phone]  Physical Therapy Treatment  Patient Details  Name: Samantha Curtis MRN: [id number] Date of Birth: 1966-05-25 Referring Provider (PT): Marcene Corning, MD   Encounter Date: 03/17/2020   PT End of Session - 03/17/20 1358    Visit Number 2    Date for PT Re-Evaluation 06/01/20    PT Start Time 1355    PT Stop Time 1445    PT Time Calculation (min) 50 min    Activity Tolerance Patient tolerated treatment well    Behavior During Therapy [facility] for tasks assessed/performed           Past Medical History:  Diagnosis Date  . Arthritis    "knees" (07/04/2017)  . Dysrhythmia    PALPITATIONS  . GERD (gastroesophageal reflux disease)   . High cholesterol   . Leg pain     Past Surgical History:  Procedure Laterality Date  . ENDOVENOUS ABLATION SAPHENOUS VEIN W/ LASER Left 2005   [facility]  . JOINT REPLACEMENT    . KNEE ARTHROSCOPY Right   . SHOULDER ARTHROSCOPY Right    "spurs"  . SHOULDER ARTHROSCOPY Right 06/24/2019   Procedure: RIGHT SHOULDER ARTHROSCOPY; CHONDROPLASTY AND DEBRIEDMENT;  Surgeon: Marcene Corning, MD;  [facility];  Service: Orthopedics;  Laterality: Right;  . TOTAL KNEE ARTHROPLASTY Bilateral 07/03/2017   Procedure: TOTAL KNEE BILATERAL;  Surgeon: Marcene Corning, MD;  [facility];  Service: Orthopedics;  Laterality: Bilateral;  . TUBAL LIGATION    . VAGINAL HYSTERECTOMY      There were no vitals filed for this visit.   Subjective Assessment - 03/17/20 1359    Subjective Soft tissue work on knee was great. Shoulder  and knee pain. Having scope on her RT knee.    Currently in Pain? Yes   KNee RT 10/10, RT shoulder 6/10   Pain Descriptors / Indicators Constant;Sore    Aggravating Factors  work, Surveyor, minerals on shoulder    Pain Relieving Factors ice, massage on neck    Multiple Pain Sites No                               [facility] Adult PT Treatment/Exercise - 03/17/20 0001      Neuro Re-ed    Neuro Re-ed Details  Re-education of GH joint from very anterior to more neutral, slightly posterior      Shoulder Exercises: Supine   Other Supine Exercises AAROm cane chest press 10x, overhead 10x      Moist Heat Therapy   Moist Heat Location --   RT lateral cervical post manual/strteching 10 min     Manual Therapy   Manual therapy comments RT upper trap, scalenes, levator  TP release   Addaday assist for upper RT arm and post UT    Joint Mobilization Post GH mobilization gd 2, 3      Neck Exercises: Stretches   Other Neck Stretches Look LT and RT shoulder press 5 sec hold 10x                    PT Short Term Goals - 03/10/20 1524      PT SHORT TERM GOAL #1   Title Pt will be independent with initial HEP to decrease inflammation and increase ROM.    Time 2    Period Weeks  Status New    Target Date 03/24/20      PT SHORT TERM GOAL #2   Title AROM Rt shoulder flexion 150    Baseline 140    Time 4    Period Weeks    Status New    Target Date 04/07/20             PT Long Term Goals - 03/10/20 1525      PT LONG TERM GOAL #1   Title Pt will have IR to at least 50 deg    Baseline 30 deg    Time 8    Period Weeks    Status New    Target Date 05/05/20      PT LONG TERM GOAL #2   Title Pt will be able to do overhead activities for at least 10 minutes without pain so she can do household activities    Time 8    Period Weeks    Status New    Target Date 05/05/20      PT LONG TERM GOAL #3   Title Pt will be able to sleep on the Rt shoulder without increased pain of more than 3/10    Baseline cannot be on Rt side at all - 9/10    Time 8    Period Weeks    Status New    Target Date 05/05/20      PT LONG TERM GOAL #4   Title Pt will be able to complete full shift at work with no more than 3/10 NPRS    Baseline 5-8/10    Time 8     Period Weeks    Status New    Target Date 05/05/20      PT LONG TERM GOAL #5   Title .Marland KitchenMarland Kitchen                 Plan - 03/17/20 1358    Clinical Impression Statement Pt reports 2 "good days" after having hands on work to her neck and "top of the shoulder." Pt's RT upper trap superior and posterior portions remain with very rigid resting tone and two significant trigger points. Pt would benefit from dry needling to these trigger points. Upper trap tone reduced with manual work but trigger points unchanged. Pt carries her humeral head anterior, manual re-ed to get at least more neutral. pt cannot sustain the position. used moist heat post manual work to reduce post PT muscle soreness.    Personal Factors and Comorbidities Time since onset of injury/illness/exacerbation;Past/Current Experience    Examination-Activity Limitations Caring for Others;Reach Overhead    Examination-Participation Restrictions Occupation    Stability/Clinical Decision Making Stable/Uncomplicated    Rehab Potential Excellent    PT Frequency 2x / week    PT Duration 8 weeks    PT Treatment/Interventions ADLs/Self Care Home Management;Cryotherapy;Electrical Stimulation;Iontophoresis 4mg /ml Dexamethasone;Moist Heat;Ultrasound;Therapeutic activities;Therapeutic exercise;Neuromuscular re-education;Manual techniques;Patient/family education;Taping;Dry needling;Passive range of motion    PT Next Visit Plan Soft tissue/dry needling to Rt cervical and shoulder. Work on RTC stabilization to help get humeral head more neutral    PT Home Exercise Plan Access Code: [email or web address]    Consulted and Agree with Plan of Care Patient           Patient will benefit from skilled therapeutic intervention in order to improve the following deficits and impairments:  Decreased range of motion, Increased fascial restricitons, Pain, Postural dysfunction, Decreased strength, Impaired flexibility, Impaired UE functional use  Visit  Diagnosis: Stiffness of  right shoulder, not elsewhere classified  Acute pain of right shoulder  Muscle weakness (generalized)  Other muscle spasm     Problem List Patient Active Problem List   Diagnosis Date Noted  . S/P TKR (total knee replacement), bilateral   . Gastroesophageal reflux disease   . Post-operative pain   . Elevated blood pressure reading   . Bilateral primary osteoarthritis of knee 07/03/2017  . Lateral epicondylitis of right elbow 04/02/2013    Ane Payment, PTA 03/17/20 2:40 PM   [facility] [address] [place], [place], [address] Phone: [phone]   Fax:  [phone]  Name: Samantha Curtis MRN: [id number] Date of Birth: 01-28-1966

## 2020-03-24 ENCOUNTER — Encounter: Payer: Self-pay | Admitting: Physical Therapy

## 2020-03-24 ENCOUNTER — Ambulatory Visit: Payer: PRIVATE HEALTH INSURANCE | Attending: Orthopaedic Surgery | Admitting: Physical Therapy

## 2020-03-24 ENCOUNTER — Other Ambulatory Visit: Payer: Self-pay

## 2020-03-24 DIAGNOSIS — M25511 Pain in right shoulder: Secondary | ICD-10-CM | POA: Diagnosis present

## 2020-03-24 DIAGNOSIS — M6281 Muscle weakness (generalized): Secondary | ICD-10-CM | POA: Insufficient documentation

## 2020-03-24 DIAGNOSIS — R600 Localized edema: Secondary | ICD-10-CM | POA: Insufficient documentation

## 2020-03-24 DIAGNOSIS — M25611 Stiffness of right shoulder, not elsewhere classified: Secondary | ICD-10-CM | POA: Diagnosis present

## 2020-03-24 DIAGNOSIS — M62838 Other muscle spasm: Secondary | ICD-10-CM | POA: Insufficient documentation

## 2020-03-24 NOTE — Therapy (Signed)
[facility] [address], [address] [place], [place], [address] Phone: [phone]   Fax:  [phone]  Physical Therapy Treatment  Patient Details  Name: Samantha Curtis MRN: [id number] Date of Birth: September 17, 1965 Referring Provider (PT): Marcene Corning, MD   Encounter Date: 03/24/2020   PT End of Session - 03/24/20 1405    Visit Number 3    Date for PT Re-Evaluation 06/01/20    PT Start Time 1403    Activity Tolerance Patient tolerated treatment well    Behavior During Therapy [facility] for tasks assessed/performed           Past Medical History:  Diagnosis Date  . Arthritis    "knees" (07/04/2017)  . Dysrhythmia    PALPITATIONS  . GERD (gastroesophageal reflux disease)   . High cholesterol   . Leg pain     Past Surgical History:  Procedure Laterality Date  . ENDOVENOUS ABLATION SAPHENOUS VEIN W/ LASER Left 2005   [facility]  . JOINT REPLACEMENT    . KNEE ARTHROSCOPY Right   . SHOULDER ARTHROSCOPY Right    "spurs"  . SHOULDER ARTHROSCOPY Right 06/24/2019   Procedure: RIGHT SHOULDER ARTHROSCOPY; CHONDROPLASTY AND DEBRIEDMENT;  Surgeon: Marcene Corning, MD;  Location: [facility];  Service: Orthopedics;  Laterality: Right;  . TOTAL KNEE ARTHROPLASTY Bilateral 07/03/2017   Procedure: TOTAL KNEE BILATERAL;  Surgeon: Marcene Corning, MD;  Location: [facility];  Service: Orthopedics;  Laterality: Bilateral;  . TUBAL LIGATION    . VAGINAL HYSTERECTOMY      There were no vitals filed for this visit.   Subjective Assessment - 03/24/20 1406    Subjective My shoulder is better during the day but night time is still bad.    Currently in Pain? Yes    Pain Location Shoulder    Pain Orientation Right    Pain Descriptors / Indicators Sore    Aggravating Factors  at night    Pain Relieving Factors stretching my neck    Multiple Pain Sites No                             [facility] Adult PT Treatment/Exercise - 03/24/20  0001      Shoulder Exercises: Supine   Other Supine Exercises shld press 5 sec hold 1ox    Other Supine Exercises yellow band supine bow & arrow 10x      Shoulder Exercises: Sidelying   External Rotation AROM;Right;10 reps    External Rotation Limitations pain with movement      Manual Therapy   Manual Therapy --   TP pressure then LT ear to shld 5x 5 sec hold   Manual therapy comments RT upper trap, scalenes, levator  TP release   Addaday assist for upper RT arm and post UT    Joint Mobilization Post & Inf  GH mobilization gd 2, 3    Passive ROM All planes 10x                     PT Short Term Goals - 03/10/20 1524      PT SHORT TERM GOAL #1   Title Pt will be independent with initial HEP to decrease inflammation and increase ROM.    Time 2    Period Weeks    Status New    Target Date 03/24/20      PT SHORT TERM GOAL #2   Title AROM Rt  shoulder flexion 150    Baseline 140    Time 4    Period Weeks    Status New    Target Date 04/07/20             PT Long Term Goals - 03/10/20 1525      PT LONG TERM GOAL #1   Title Pt will have IR to at least 50 deg    Baseline 30 deg    Time 8    Period Weeks    Status New    Target Date 05/05/20      PT LONG TERM GOAL #2   Title Pt will be able to do overhead activities for at least 10 minutes without pain so she can do household activities    Time 8    Period Weeks    Status New    Target Date 05/05/20      PT LONG TERM GOAL #3   Title Pt will be able to sleep on the Rt shoulder without increased pain of more than 3/10    Baseline cannot be on Rt side at all - 9/10    Time 8    Period Weeks    Status New    Target Date 05/05/20      PT LONG TERM GOAL #4   Title Pt will be able to complete full shift at work with no more than 3/10 NPRS    Baseline 5-8/10    Time 8    Period Weeks    Status New    Target Date 05/05/20      PT LONG TERM GOAL #5   Title .Marland KitchenMarland Kitchen                 Plan - 03/24/20  1405    Personal Factors and Comorbidities Time since onset of injury/illness/exacerbation;Past/Current Experience    Examination-Activity Limitations Caring for Others;Reach Overhead    Examination-Participation Restrictions Occupation    Stability/Clinical Decision Making Stable/Uncomplicated    Rehab Potential Excellent    PT Frequency 2x / week    PT Duration 8 weeks    PT Treatment/Interventions ADLs/Self Care Home Management;Cryotherapy;Electrical Stimulation;Iontophoresis 4mg /ml Dexamethasone;Moist Heat;Ultrasound;Therapeutic activities;Therapeutic exercise;Neuromuscular re-education;Manual techniques;Patient/family education;Taping;Dry needling;Passive range of motion    PT Home Exercise Plan Access Code: [email or web address]    Consulted and Agree with Plan of Care Patient           Patient will benefit from skilled therapeutic intervention in order to improve the following deficits and impairments:  Decreased range of motion, Increased fascial restricitons, Pain, Postural dysfunction, Decreased strength, Impaired flexibility, Impaired UE functional use  Visit Diagnosis: Stiffness of right shoulder, not elsewhere classified  Acute pain of right shoulder  Muscle weakness (generalized)  Other muscle spasm  Localized edema     Problem List Patient Active Problem List   Diagnosis Date Noted  . S/P TKR (total knee replacement), bilateral   . Gastroesophageal reflux disease   . Post-operative pain   . Elevated blood pressure reading   . Bilateral primary osteoarthritis of knee 07/03/2017  . Lateral epicondylitis of right elbow 04/02/2013    Ahan Eisenberger, PTA 03/24/2020, 2:40 PM  [facility] [address], [address] [place], [place], [place] Phone: [phone]   Fax:  [phone]  Name: BRESLIN BURKLOW MRN: [name] Date of Birth: 12-05-1965  Access Code: [email or web address]: [email or web address]:  10/06/2021Prepared by: 05/24/2020 CochranExercises  Corner Pec Minor Stretch - 1 x daily - 7 x weekly -  3 sets - 10 reps  Seated Gentle Upper Trapezius Stretch - 1 x daily - 7 x weekly - 3 reps - 1 sets - 30 sec hold  Seated Bilateral Shoulder Flexion Towel Slide at Table Top - 1 x daily - 7 x weekly - 3 sets - 10 reps Patient Education  Trigger Point Dry Needling

## 2020-03-29 ENCOUNTER — Ambulatory Visit: Payer: PRIVATE HEALTH INSURANCE | Admitting: Physical Therapy

## 2020-03-29 ENCOUNTER — Encounter: Payer: Self-pay | Admitting: Physical Therapy

## 2020-03-29 ENCOUNTER — Other Ambulatory Visit: Payer: Self-pay

## 2020-03-29 DIAGNOSIS — M25511 Pain in right shoulder: Secondary | ICD-10-CM

## 2020-03-29 DIAGNOSIS — M25611 Stiffness of right shoulder, not elsewhere classified: Secondary | ICD-10-CM | POA: Diagnosis not present

## 2020-03-29 DIAGNOSIS — M6281 Muscle weakness (generalized): Secondary | ICD-10-CM

## 2020-03-29 NOTE — Therapy (Signed)
[facility] [address] [place], [place], [address] Phone: [phone]   Fax:  [phone]  Physical Therapy Treatment  Patient Details  Name: JESIAH YERBY MRN: [id number] Date of Birth: [birth year] Referring Provider (PT): Marcene Corning, MD   Encounter Date: 03/29/2020   PT End of Session - 03/29/20 1438    Visit Number 4    Date for PT Re-Evaluation 06/01/20    PT Start Time 1434    PT Stop Time 1520    PT Time Calculation (min) 46 min    Activity Tolerance Patient tolerated treatment well    Behavior During Therapy [facility] for tasks assessed/performed           Past Medical History:  Diagnosis Date  . Arthritis    "knees" (07/04/2017)  . Dysrhythmia    PALPITATIONS  . GERD (gastroesophageal reflux disease)   . High cholesterol   . Leg pain     Past Surgical History:  Procedure Laterality Date  . ENDOVENOUS ABLATION SAPHENOUS VEIN W/ LASER Left 2005   [facility]  . JOINT REPLACEMENT    . KNEE ARTHROSCOPY Right   . SHOULDER ARTHROSCOPY Right    "spurs"  . SHOULDER ARTHROSCOPY Right 06/24/2019   Procedure: RIGHT SHOULDER ARTHROSCOPY; CHONDROPLASTY AND DEBRIEDMENT;  Surgeon: Marcene Corning, MD;  Location: [facility];  Service: Orthopedics;  Laterality: Right;  . TOTAL KNEE ARTHROPLASTY Bilateral 07/03/2017   Procedure: TOTAL KNEE BILATERAL;  Surgeon: Marcene Corning, MD;  [facility]: [facility];  Service: Orthopedics;  Laterality: Bilateral;  . TUBAL LIGATION    . VAGINAL HYSTERECTOMY      There were no vitals filed for this visit.   Subjective Assessment - 03/29/20 1439    Subjective The dry needling really helped relax those muscles. I am so much better 2 days post PT. I still have most of my pain at night.    Currently in Pain? Yes    Pain Score 4     Pain Location Shoulder    Pain Orientation Right    Pain Descriptors / Indicators Sore    Aggravating Factors  at night    Pain Relieving Factors  strteching, massage, heat, TENS    Multiple Pain Sites No                             [facility] Adult PT Treatment/Exercise - 03/29/20 0001      Shoulder Exercises: Supine   Flexion AROM;Right;10 reps    Other Supine Exercises shld press 5 sec hold 1ox   added turn to the LT     Shoulder Exercises: Sidelying   External Rotation AROM;Right;20 reps    ABduction AROM;Right;10 reps      Shoulder Exercises: Standing   Row Strengthening;Both;20 reps;Theraband    Theraband Level (Shoulder Row) Level 1 (Yellow)      Shoulder Exercises: Pulleys   Flexion 2 minutes      Shoulder Exercises: ROM/Strengthening   UBE (Upper Arm Bike) L1 reverse 2 min      Moist Heat Therapy   Moist Heat Location --   Pre 6 minto neck and Rt shoulder with status review     Manual Therapy   Manual therapy comments RT upper trap, scalenes, SCM,  levator  TP release   Addaday assist for upper RT arm and post UT    Joint Mobilization painful, stopped    Passive ROM All planes 10x  PT Short Term Goals - 03/10/20 1524      PT SHORT TERM GOAL #1   Title Pt will be independent with initial HEP to decrease inflammation and increase ROM.    Time 2    Period Weeks    Status New    Target Date 03/24/20      PT SHORT TERM GOAL #2   Title AROM Rt shoulder flexion 150    Baseline 140    Time 4    Period Weeks    Status New    Target Date 04/07/20             PT Long Term Goals - 03/10/20 1525      PT LONG TERM GOAL #1   Title Pt will have IR to at least 50 deg    Baseline 30 deg    Time 8    Period Weeks    Status New    Target Date 05/05/20      PT LONG TERM GOAL #2   Title Pt will be able to do overhead activities for at least 10 minutes without pain so she can do household activities    Time 8    Period Weeks    Status New    Target Date 05/05/20      PT LONG TERM GOAL #3   Title Pt will be able to sleep on the Rt shoulder without increased  pain of more than 3/10    Baseline cannot be on Rt side at all - 9/10    Time 8    Period Weeks    Status New    Target Date 05/05/20      PT LONG TERM GOAL #4   Title Pt will be able to complete full shift at work with no more than 3/10 NPRS    Baseline 5-8/10    Time 8    Period Weeks    Status New    Target Date 05/05/20      PT LONG TERM GOAL #5   Title .Marland KitchenMarland Kitchen                 Plan - 03/29/20 1438    Clinical Impression Statement Pt responded well to dry needling last session to her neck muscles. She is scheduled to have knee scope this Thursday. Pt did not tolerate joint mobs today, too pianful, but pt's PROM was full and non painful. Pt was able to complete AROM exercises without pain, she previously had not been to. Pt will have knee surgery this Thursday and be back next week.    Personal Factors and Comorbidities Time since onset of injury/illness/exacerbation;Past/Current Experience    Examination-Activity Limitations Caring for Others;Reach Overhead    Examination-Participation Restrictions Occupation    Stability/Clinical Decision Making Stable/Uncomplicated    Rehab Potential Excellent    PT Frequency 2x / week    PT Duration 8 weeks    PT Treatment/Interventions ADLs/Self Care Home Management;Cryotherapy;Electrical Stimulation;Iontophoresis 4mg /ml Dexamethasone;Moist Heat;Ultrasound;Therapeutic activities;Therapeutic exercise;Neuromuscular re-education;Manual techniques;Patient/family education;Taping;Dry needling;Passive range of motion    PT Next Visit Plan Soft tissue/dry needling to Rt cervical and shoulder. Work on RTC stabilization to help get humeral head more neutral    PT Home Exercise Plan Access Code: [email or web address]    Consulted and Agree with Plan of Care Patient           Patient will benefit from skilled therapeutic intervention in order to improve the following deficits and impairments:  Decreased range  of motion, Increased fascial restricitons, Pain,  Postural dysfunction, Decreased strength, Impaired flexibility, Impaired UE functional use  Visit Diagnosis: Stiffness of right shoulder, not elsewhere classified  Acute pain of right shoulder  Muscle weakness (generalized)     Problem List Patient Active Problem List   Diagnosis Date Noted  . S/P TKR (total knee replacement), bilateral   . Gastroesophageal reflux disease   . Post-operative pain   . Elevated blood pressure reading   . Bilateral primary osteoarthritis of knee 07/03/2017  . Lateral epicondylitis of right elbow 04/02/2013    Tristyn Pharris, PTA 03/29/2020, 3:21 PM  [facility] [address], [address] [place], [place], [address] Phone: [phone]   Fax:  [phone]  Name: ANNAMARIE YAMAGUCHI MRN: [id number] Date of Birth: 09-Nov-1965

## 2020-04-05 ENCOUNTER — Other Ambulatory Visit: Payer: Self-pay | Admitting: Internal Medicine

## 2020-04-05 DIAGNOSIS — Z1231 Encounter for screening mammogram for malignant neoplasm of breast: Secondary | ICD-10-CM

## 2020-04-06 ENCOUNTER — Ambulatory Visit: Payer: PRIVATE HEALTH INSURANCE | Admitting: Physical Therapy

## 2020-04-06 ENCOUNTER — Other Ambulatory Visit: Payer: Self-pay

## 2020-04-06 DIAGNOSIS — M25611 Stiffness of right shoulder, not elsewhere classified: Secondary | ICD-10-CM

## 2020-04-06 DIAGNOSIS — M25511 Pain in right shoulder: Secondary | ICD-10-CM

## 2020-04-06 DIAGNOSIS — M62838 Other muscle spasm: Secondary | ICD-10-CM

## 2020-04-06 DIAGNOSIS — R600 Localized edema: Secondary | ICD-10-CM

## 2020-04-06 DIAGNOSIS — M6281 Muscle weakness (generalized): Secondary | ICD-10-CM

## 2020-04-06 NOTE — Therapy (Signed)
[facility] [address] [place], [place], [address] Phone: [phone]   Fax:  [phone]  Physical Therapy Treatment  Patient Details  Name: Samantha Curtis MRN: [id number] Date of Birth: 1965/08/25 Referring Provider (PT): Marcene Corning, MD   Encounter Date: 04/06/2020   PT End of Session - 04/06/20 1700    Visit Number 5    Date for PT Re-Evaluation 06/01/20    PT Start Time 1620    PT Stop Time 1700    PT Time Calculation (min) 40 min    Activity Tolerance Patient tolerated treatment well    Behavior During Therapy [facility] for tasks assessed/performed           Past Medical History:  Diagnosis Date  . Arthritis    "knees" (07/04/2017)  . Dysrhythmia    PALPITATIONS  . GERD (gastroesophageal reflux disease)   . High cholesterol   . Leg pain     Past Surgical History:  Procedure Laterality Date  . ENDOVENOUS ABLATION SAPHENOUS VEIN W/ LASER Left 2005   [facility]  . JOINT REPLACEMENT    . KNEE ARTHROSCOPY Right   . SHOULDER ARTHROSCOPY Right    "spurs"  . SHOULDER ARTHROSCOPY Right 06/24/2019   Procedure: RIGHT SHOULDER ARTHROSCOPY; CHONDROPLASTY AND DEBRIEDMENT;  Surgeon: Marcene Corning, MD;  Location: [facility];  Service: Orthopedics;  Laterality: Right;  . TOTAL KNEE ARTHROPLASTY Bilateral 07/03/2017   Procedure: TOTAL KNEE BILATERAL;  Surgeon: Marcene Corning, MD;  Location: [facility];  Service: Orthopedics;  Laterality: Bilateral;  . TUBAL LIGATION    . VAGINAL HYSTERECTOMY      There were no vitals filed for this visit.   Subjective Assessment - 04/06/20 1621    Subjective Pt states shoulder is okay because I haven't been working since knee surgery    Currently in Pain? Yes   no comment on number                            [facility] Adult PT Treatment/Exercise - 04/06/20 0001      Shoulder Exercises: ROM/Strengthening   Other ROM/Strengthening Exercises SCM stretch Rt side  - 30 sec      Manual Therapy   Manual therapy comments RT upper trap, scalenes, SCM,  levator  TP release, RTC, subclavicularus, pec minor attachments   very gentle pressure all very TTP           Trigger Point Dry Needling - 04/06/20 0001    Consent Given? Yes    Education Handout Provided Previously provided    Muscles Treated Head and Neck Upper trapezius    Muscles Treated Upper Quadrant Teres minor;Subscapularis    Dry Needling Comments Rt side only    Upper Trapezius Response Twitch reponse elicited;Palpable increased muscle length    Subscapularis Response Twitch response elicited;Palpable increased muscle length    Teres minor Response Twitch response elicited;Palpable increased muscle length                  PT Short Term Goals - 03/10/20 1524      PT SHORT TERM GOAL #1   Title Pt will be independent with initial HEP to decrease inflammation and increase ROM.    Time 2    Period Weeks    Status New    Target Date 03/24/20      PT SHORT TERM GOAL #2   Title AROM Rt shoulder flexion 150  Baseline 140    Time 4    Period Weeks    Status New    Target Date 04/07/20             PT Long Term Goals - 03/10/20 1525      PT LONG TERM GOAL #1   Title Pt will have IR to at least 50 deg    Baseline 30 deg    Time 8    Period Weeks    Status New    Target Date 05/05/20      PT LONG TERM GOAL #2   Title Pt will be able to do overhead activities for at least 10 minutes without pain so she can do household activities    Time 8    Period Weeks    Status New    Target Date 05/05/20      PT LONG TERM GOAL #3   Title Pt will be able to sleep on the Rt shoulder without increased pain of more than 3/10    Baseline cannot be on Rt side at all - 9/10    Time 8    Period Weeks    Status New    Target Date 05/05/20      PT LONG TERM GOAL #4   Title Pt will be able to complete full shift at work with no more than 3/10 NPRS    Baseline 5-8/10    Time 8      Period Weeks    Status New    Target Date 05/05/20      PT LONG TERM GOAL #5   Title .Marland KitchenMarland Kitchen                 Plan - 04/06/20 1701    Clinical Impression Statement Pt responded well to dry needling with a lot of muscle twitching in teres minor and eased after treatment. Pt was very TTP and inflamed around corocoid process, and sternoclavicular joint.  Pt needed cues to reduce muscle guarding.  Continue to progress A/PROM exercises.    PT Treatment/Interventions ADLs/Self Care Home Management;Cryotherapy;Electrical Stimulation;Iontophoresis 4mg /ml Dexamethasone;Moist Heat;Ultrasound;Therapeutic activities;Therapeutic exercise;Neuromuscular re-education;Manual techniques;Patient/family education;Taping;Dry needling;Passive range of motion    PT Next Visit Plan Soft tissue/dry needling to Rt cervical and shoulder. Work on RTC stabilization to help get humeral head more neutral    PT Home Exercise Plan Access Code: [email or web address]    Consulted and Agree with Plan of Care Patient           Patient will benefit from skilled therapeutic intervention in order to improve the following deficits and impairments:  Decreased range of motion, Increased fascial restricitons, Pain, Postural dysfunction, Decreased strength, Impaired flexibility, Impaired UE functional use  Visit Diagnosis: Stiffness of right shoulder, not elsewhere classified  Acute pain of right shoulder  Muscle weakness (generalized)  Other muscle spasm  Localized edema     Problem List Patient Active Problem List   Diagnosis Date Noted  . S/P TKR (total knee replacement), bilateral   . Gastroesophageal reflux disease   . Post-operative pain   . Elevated blood pressure reading   . Bilateral primary osteoarthritis of knee 07/03/2017  . Lateral epicondylitis of right elbow 04/02/2013    04/04/2013, PT 04/06/2020, 5:09 PM  [facility] [address] [place], [place], [place] Phone: [phone]   Fax:  [phone]  Name: Samantha Curtis MRN: [name] Date of Birth: 09-13-1965

## 2020-04-13 ENCOUNTER — Encounter: Payer: Self-pay | Admitting: Physical Therapy

## 2020-04-13 ENCOUNTER — Ambulatory Visit: Payer: PRIVATE HEALTH INSURANCE | Admitting: Physical Therapy

## 2020-04-13 ENCOUNTER — Other Ambulatory Visit: Payer: Self-pay

## 2020-04-13 DIAGNOSIS — M25611 Stiffness of right shoulder, not elsewhere classified: Secondary | ICD-10-CM

## 2020-04-13 DIAGNOSIS — M62838 Other muscle spasm: Secondary | ICD-10-CM

## 2020-04-13 DIAGNOSIS — M6281 Muscle weakness (generalized): Secondary | ICD-10-CM

## 2020-04-13 DIAGNOSIS — R600 Localized edema: Secondary | ICD-10-CM

## 2020-04-13 DIAGNOSIS — M25511 Pain in right shoulder: Secondary | ICD-10-CM

## 2020-04-13 NOTE — Therapy (Signed)
[facility] [address] [place], [place], [address] Phone: [phone]   Fax:  [phone]  Physical Therapy Treatment  Patient Details  Name: Samantha Curtis MRN: [id number] Date of Birth: 1965-07-18 Referring Provider (PT): Marcene Corning, MD   Encounter Date: 04/13/2020   PT End of Session - 04/13/20 1716    Visit Number 6    Date for PT Re-Evaluation 06/01/20    Authorization - Number of Visits 6    Progress Note Due on Visit 9    PT Start Time 1616    PT Stop Time 1700    PT Time Calculation (min) 44 min    Activity Tolerance Patient tolerated treatment well    Behavior During Therapy [facility] for tasks assessed/performed           Past Medical History:  Diagnosis Date  . Arthritis    "knees" (07/04/2017)  . Dysrhythmia    PALPITATIONS  . GERD (gastroesophageal reflux disease)   . High cholesterol   . Leg pain     Past Surgical History:  Procedure Laterality Date  . ENDOVENOUS ABLATION SAPHENOUS VEIN W/ LASER Left 2005   [facility]  . JOINT REPLACEMENT    . KNEE ARTHROSCOPY Right   . SHOULDER ARTHROSCOPY Right    "spurs"  . SHOULDER ARTHROSCOPY Right 06/24/2019   Procedure: RIGHT SHOULDER ARTHROSCOPY; CHONDROPLASTY AND DEBRIEDMENT;  Surgeon: Marcene Corning, MD;  [facility];  Service: Orthopedics;  Laterality: Right;  . TOTAL KNEE ARTHROPLASTY Bilateral 07/03/2017   Procedure: TOTAL KNEE BILATERAL;  Surgeon: Marcene Corning, MD;  [facility];  Service: Orthopedics;  Laterality: Bilateral;  . TUBAL LIGATION    . VAGINAL HYSTERECTOMY      There were no vitals filed for this visit.   Subjective Assessment - 04/13/20 1619    Subjective Pt states she is still off work so shoulder is feeling good    Currently in Pain? Yes    Pain Score 4     Pain Location Shoulder    Pain Orientation Right    Pain Descriptors / Indicators Sore    Pain Type Acute pain;Surgical pain    Pain Onset  More than a month ago    Aggravating Factors  at night it is worse    Pain Relieving Factors stretching, TENS, massage, heat    Multiple Pain Sites No                             [facility] Adult PT Treatment/Exercise - 04/13/20 0001      Shoulder Exercises: Standing   Flexion Limitations finger ladder - 10x    Other Standing Exercises shoulder adduction blue, punch blue - 20x    Other Standing Exercises flexion and scap 2 lb - 20x      Shoulder Exercises: ROM/Strengthening   UBE (Upper Arm Bike) L1 2/2 minutes fwd/back - PT present for status      Manual Therapy   Manual therapy comments RT upper trap, scalenes, SCM,  levator  TP release, RTC, subclavicularus, pec minor attachments   very gentle pressure all very TTP                   PT Short Term Goals - 04/13/20 1629      PT SHORT TERM GOAL #1   Title Pt will be independent with initial HEP to decrease inflammation and increase ROM.  Status Achieved      PT SHORT TERM GOAL #2   Title AROM Rt shoulder flexion 150    Baseline 160    Status Achieved             PT Long Term Goals - 04/13/20 1631      PT LONG TERM GOAL #1   Title Pt will have IR to at least 50 deg    Baseline 62 deg    Status Achieved      PT LONG TERM GOAL #2   Title Pt will be able to do overhead activities for at least 10 minutes without pain so she can do household activities    Baseline I can do it for a few minutes    Status On-going      PT LONG TERM GOAL #3   Title Pt will be able to sleep on the Rt shoulder without increased pain of more than 3/10    Baseline wakes me up because of pain    Status On-going      PT LONG TERM GOAL #4   Title Pt will be able to complete full shift at work with no more than 3/10 NPRS    Status On-going                 Plan - 04/13/20 1718    Clinical Impression Statement Pt did well with exercises today and added some overall shoulder/scapular strengthening as he pain  level was improved. Pt needed cues for shoulder blade stability.  Pt reached goals for IR and flexion ROM as seen above.  Pt has tension in subclavius and pec muscles but was able to release with STM. Pt will benefit from skilled PT to continue to work on release of soft tissue adhesions and shoulder stability.    PT Treatment/Interventions ADLs/Self Care Home Management;Cryotherapy;Electrical Stimulation;Iontophoresis 4mg /ml Dexamethasone;Moist Heat;Ultrasound;Therapeutic activities;Therapeutic exercise;Neuromuscular re-education;Manual techniques;Patient/family education;Taping;Dry needling;Passive range of motion    PT Next Visit Plan Soft tissue/dry needling to Rt cervical and shoulder, subclavius, pecs. Work on RTC stabilization to help get humeral head more neutral    PT Home Exercise Plan Access Code: [email or web address] - gave blue band and added flexion punch in standing    Consulted and Agree with Plan of Care Patient           Patient will benefit from skilled therapeutic intervention in order to improve the following deficits and impairments:  Decreased range of motion, Increased fascial restricitons, Pain, Postural dysfunction, Decreased strength, Impaired flexibility, Impaired UE functional use  Visit Diagnosis: Stiffness of right shoulder, not elsewhere classified  Acute pain of right shoulder  Muscle weakness (generalized)  Other muscle spasm  Localized edema     Problem List Patient Active Problem List   Diagnosis Date Noted  . S/P TKR (total knee replacement), bilateral   . Gastroesophageal reflux disease   . Post-operative pain   . Elevated blood pressure reading   . Bilateral primary osteoarthritis of knee 07/03/2017  . Lateral epicondylitis of right elbow 04/02/2013    04/04/2013, PT 04/13/2020, 5:29 PM  [facility] [address] [place], [place], [place] Phone: [phone]   Fax:   [phone]  Name: Samantha Curtis MRN: [name] Date of Birth: December 19, 1965

## 2020-04-21 ENCOUNTER — Encounter: Payer: Self-pay | Admitting: Physical Therapy

## 2020-04-21 ENCOUNTER — Other Ambulatory Visit: Payer: Self-pay

## 2020-04-21 ENCOUNTER — Ambulatory Visit: Payer: PRIVATE HEALTH INSURANCE | Attending: Orthopaedic Surgery | Admitting: Physical Therapy

## 2020-04-21 DIAGNOSIS — M6281 Muscle weakness (generalized): Secondary | ICD-10-CM | POA: Diagnosis present

## 2020-04-21 DIAGNOSIS — M62838 Other muscle spasm: Secondary | ICD-10-CM | POA: Diagnosis present

## 2020-04-21 DIAGNOSIS — M25511 Pain in right shoulder: Secondary | ICD-10-CM | POA: Insufficient documentation

## 2020-04-21 DIAGNOSIS — M25611 Stiffness of right shoulder, not elsewhere classified: Secondary | ICD-10-CM | POA: Diagnosis not present

## 2020-04-21 DIAGNOSIS — R600 Localized edema: Secondary | ICD-10-CM | POA: Insufficient documentation

## 2020-04-21 NOTE — Therapy (Signed)
[facility] [address], [place], [place], [address] Phone: [phone]   Fax:  [phone]  Physical Therapy Treatment  Patient Details  Name: Samantha Curtis MRN: [id number] Date of Birth: Nov 08, 1965 Referring Provider (PT): Melrose Nakayama, MD   Encounter Date: 04/21/2020   PT End of Session - 04/21/20 1406    Visit Number 7    Date for PT Re-Evaluation 06/01/20    Authorization - Number of Visits 7    Progress Note Due on Visit 9    PT Start Time 1400    PT Stop Time 1442    PT Time Calculation (min) 42 min    Activity Tolerance Patient tolerated treatment well    Behavior During Therapy [facility] for tasks assessed/performed           Past Medical History:  Diagnosis Date  . Arthritis    "knees" (07/04/2017)  . Dysrhythmia    PALPITATIONS  . GERD (gastroesophageal reflux disease)   . High cholesterol   . Leg pain     Past Surgical History:  Procedure Laterality Date  . ENDOVENOUS ABLATION SAPHENOUS VEIN W/ LASER Left 2005   [facility]  . JOINT REPLACEMENT    . KNEE ARTHROSCOPY Right   . SHOULDER ARTHROSCOPY Right    "spurs"  . SHOULDER ARTHROSCOPY Right 06/24/2019   Procedure: RIGHT SHOULDER ARTHROSCOPY; CHONDROPLASTY AND DEBRIEDMENT;  Surgeon: Melrose Nakayama, MD;  [facility];  Service: Orthopedics;  Laterality: Right;  . TOTAL KNEE ARTHROPLASTY Bilateral 07/03/2017   Procedure: TOTAL KNEE BILATERAL;  Surgeon: Melrose Nakayama, MD;  Location: [facility];  Service: Orthopedics;  Laterality: Bilateral;  . TUBAL LIGATION    . VAGINAL HYSTERECTOMY      There were no vitals filed for this visit.   Subjective Assessment - 04/21/20 1403    Subjective Pt is back to work this week.  States her knee is 11/10 because she aggravated it at work.  Pt states shoulder is feeling good.    Currently in Pain? Yes    Pain Score 3     Pain Location Shoulder    Pain Orientation Right    Pain Descriptors /  Indicators Sore    Pain Type Acute pain;Surgical pain    Pain Onset More than a month ago    Aggravating Factors  working    Multiple Pain Sites No              [facility] PT Assessment - 04/21/20 0001      AROM   Right Shoulder Flexion 152 Degrees   A/AROM     PROM   Right Shoulder Internal Rotation 76 Degrees    Right Shoulder External Rotation 95 Degrees                         [facility] Adult PT Treatment/Exercise - 04/21/20 0001      Shoulder Exercises: Standing   Flexion Limitations finger ladder - 10x    Other Standing Exercises horizontal abduction/adduction 20x - red band      Shoulder Exercises: ROM/Strengthening   Nustep L1 x 5 min PT present for status update      Manual Therapy   Manual therapy comments RT upper trap, scalenes, SCM,  levator  TP release, RTC, subclavicularus, pec minor attachments   very gentle pressure all very TTP  PT Short Term Goals - 04/13/20 1629      PT SHORT TERM GOAL #1   Title Pt will be independent with initial HEP to decrease inflammation and increase ROM.    Status Achieved      PT SHORT TERM GOAL #2   Title AROM Rt shoulder flexion 150    Baseline 160    Status Achieved             PT Long Term Goals - 04/21/20 1423      PT LONG TERM GOAL #1   Title Pt will have IR to at least 50 deg    Baseline 62 deg    Status Achieved      PT LONG TERM GOAL #2   Title Pt will be able to do overhead activities for at least 10 minutes without pain so she can do household activities    Baseline I can do what I need like before surgery, 10 minutes at least    Status Achieved      PT LONG TERM GOAL #3   Title Pt will be able to sleep on the Rt shoulder without increased pain of more than 3/10    Baseline getting better but can tolerate more    Status On-going      PT LONG TERM GOAL #4   Title Pt will be able to complete full shift at work with no more than 3/10 NPRS    Status Achieved                   Plan - 04/21/20 1441    Clinical Impression Statement Pt met several long term goals.  Pt continues to have some pain at the end of the day and is significant pain when sleeping on her Rt side.  Pt has been able to steadily increase strength with PT and STM length of shoulder girdle on Rt side.  Continue with skilled PT per POC.    PT Treatment/Interventions ADLs/Self Care Home Management;Cryotherapy;Electrical Stimulation;Iontophoresis 4mg /ml Dexamethasone;Moist Heat;Ultrasound;Therapeutic activities;Therapeutic exercise;Neuromuscular re-education;Manual techniques;Patient/family education;Taping;Dry needling;Passive range of motion    PT Next Visit Plan Soft tissue Rt cervical and pec major/minor, subclavius, Work on RTC stabilization to help get humeral head more neutral, cues for posutre needed    PT Home Exercise Plan Access Code: [email or web address] - gave blue band and added flexion punch in standing    Consulted and Agree with Plan of Care Patient           Patient will benefit from skilled therapeutic intervention in order to improve the following deficits and impairments:  Decreased range of motion, Increased fascial restricitons, Pain, Postural dysfunction, Decreased strength, Impaired flexibility, Impaired UE functional use  Visit Diagnosis: Stiffness of right shoulder, not elsewhere classified  Acute pain of right shoulder  Muscle weakness (generalized)  Other muscle spasm     Problem List Patient Active Problem List   Diagnosis Date Noted  . S/P TKR (total knee replacement), bilateral   . Gastroesophageal reflux disease   . Post-operative pain   . Elevated blood pressure reading   . Bilateral primary osteoarthritis of knee 07/03/2017  . Lateral epicondylitis of right elbow 04/02/2013    Jule Ser, PT 04/21/2020, 4:14 PM  [facility] [address] [place], [place], [address] Phone:  [phone]   Fax:  [phone]  Name: Samantha Curtis MRN: [id number] Date of Birth: 10-10-1965

## 2020-04-26 ENCOUNTER — Encounter: Payer: Self-pay | Admitting: Physical Therapy

## 2020-04-26 ENCOUNTER — Other Ambulatory Visit: Payer: Self-pay

## 2020-04-26 ENCOUNTER — Ambulatory Visit: Payer: PRIVATE HEALTH INSURANCE | Admitting: Physical Therapy

## 2020-04-26 DIAGNOSIS — M25611 Stiffness of right shoulder, not elsewhere classified: Secondary | ICD-10-CM | POA: Diagnosis not present

## 2020-04-26 DIAGNOSIS — M62838 Other muscle spasm: Secondary | ICD-10-CM

## 2020-04-26 DIAGNOSIS — M6281 Muscle weakness (generalized): Secondary | ICD-10-CM

## 2020-04-26 DIAGNOSIS — M25511 Pain in right shoulder: Secondary | ICD-10-CM

## 2020-04-26 NOTE — Therapy (Signed)
[facility] [address], [address] [place], [place], [address] Phone: [phone]   Fax:  [phone]  Physical Therapy Treatment  Patient Details  Name: Samantha Curtis MRN: [id number] Date of Birth: 1966/05/22 Referring Provider (PT): Marcene Corning, MD   Encounter Date: 04/26/2020   PT End of Session - 04/26/20 1358    Visit Number 8    Date for PT Re-Evaluation 06/01/20    Authorization - Number of Visits 8    Progress Note Due on Visit 9    PT Start Time 1359    PT Stop Time 1445    PT Time Calculation (min) 46 min    Activity Tolerance Patient tolerated treatment well    Behavior During Therapy Samantha Curtis for tasks assessed/performed           Past Medical History:  Diagnosis Date  . Arthritis    "knees" (07/04/2017)  . Dysrhythmia    PALPITATIONS  . GERD (gastroesophageal reflux disease)   . High cholesterol   . Leg pain     Past Surgical History:  Procedure Laterality Date  . ENDOVENOUS ABLATION SAPHENOUS VEIN W/ LASER Left 2005   [facility]  . JOINT REPLACEMENT    . KNEE ARTHROSCOPY Right   . SHOULDER ARTHROSCOPY Right    "spurs"  . SHOULDER ARTHROSCOPY Right 06/24/2019   Procedure: RIGHT SHOULDER ARTHROSCOPY; CHONDROPLASTY AND DEBRIEDMENT;  Surgeon: Marcene Corning, MD;  [facility];  Service: Orthopedics;  Laterality: Right;  . TOTAL KNEE ARTHROPLASTY Bilateral 07/03/2017   Procedure: TOTAL KNEE BILATERAL;  Surgeon: Marcene Corning, MD;  [facility];  Service: Orthopedics;  Laterality: Bilateral;  . TUBAL LIGATION    . VAGINAL HYSTERECTOMY      There were no vitals filed for this visit.   Subjective Assessment - 04/26/20 1404    Subjective Shoulder better, I am trying to have better posture at my shoulders    Currently in Pain? Yes    Pain Score 6     Pain Location Shoulder    Pain Orientation Right    Pain Descriptors / Indicators Sore    Aggravating Factors  work    Pain Relieving  Factors massage, heat, ice, not working    Multiple Pain Sites No                             [facility] Adult PT Treatment/Exercise - 04/26/20 0001      Shoulder Exercises: Supine   Other Supine Exercises yelloe band bow & arrow 10x    Other Supine Exercises Shld press 5x 2 pain at scapula      Shoulder Exercises: Sidelying   External Rotation Strengthening;Right;20 reps;Weights    External Rotation Weight (lbs) 1    ABduction Strengthening;Right;20 reps;Weights    ABduction Weight (lbs) 1      Shoulder Exercises: Standing   Other Standing Exercises Red Rockwood 4 10x each, given for HEP      Shoulder Exercises: ROM/Strengthening   UBE (Upper Arm Bike) L1 2x2       Cryotherapy   Number Minutes Cryotherapy 8 Minutes    Cryotherapy Location Shoulder    Type of Cryotherapy Ice pack      Manual Therapy   Manual therapy comments RT upper trap, scalenes, SCM,  levator  TP release, RTC, subclavicularus, pec minor attachments   very gentle pressure all very TTP  PT Education - 04/26/20 1420    Education Details Red Rockwood for HEP    Person(s) Educated Patient    Methods Explanation;Demonstration;Tactile cues;Verbal cues;Handout    Comprehension Returned demonstration;Verbalized understanding            PT Short Term Goals - 04/13/20 1629      PT SHORT TERM GOAL #1   Title Pt will be independent with initial HEP to decrease inflammation and increase ROM.    Status Achieved      PT SHORT TERM GOAL #2   Title AROM Rt shoulder flexion 150    Baseline 160    Status Achieved             PT Long Term Goals - 04/21/20 1423      PT LONG TERM GOAL #1   Title Pt will have IR to at least 50 deg    Baseline 62 deg    Status Achieved      PT LONG TERM GOAL #2   Title Pt will be able to do overhead activities for at least 10 minutes without pain so she can do household activities    Baseline I can do what I need like before surgery,  10 minutes at least    Status Achieved      PT LONG TERM GOAL #3   Title Pt will be able to sleep on the Rt shoulder without increased pain of more than 3/10    Baseline getting better but can tolerate more    Status On-going      PT LONG TERM GOAL #4   Title Pt will be able to complete full shift at work with no more than 3/10 NPRS    Status Achieved                 Plan - 04/26/20 1359    Clinical Impression Statement Pt  reports her shoulder is "getting better." Pain increased today post work. Pt continues to work on increasing her shoulder & scapular strength and stabilization. Pt was given Rockwood 4 for HEP using the red band , pt has band at home.    Personal Factors and Comorbidities Time since onset of injury/illness/exacerbation;Past/Current Experience    Examination-Activity Limitations Caring for Others;Reach Overhead    Examination-Participation Restrictions Occupation    Stability/Clinical Decision Making Stable/Uncomplicated    Rehab Potential Excellent    PT Frequency 2x / week    PT Duration 8 weeks    PT Treatment/Interventions ADLs/Self Care Home Management;Cryotherapy;Electrical Stimulation;Iontophoresis 4mg /ml Dexamethasone;Moist Heat;Ultrasound;Therapeutic activities;Therapeutic exercise;Neuromuscular re-education;Manual techniques;Patient/family education;Taping;Dry needling;Passive range of motion    PT Next Visit Plan Re-evaluation next,review Rockwood, goals,etc    PT Home Exercise Plan Access Code: [email or web address]    Consulted and Agree with Plan of Care Patient           Patient will benefit from skilled therapeutic intervention in order to improve the following deficits and impairments:  Decreased range of motion, Increased fascial restricitons, Pain, Postural dysfunction, Decreased strength, Impaired flexibility, Impaired UE functional use  Visit Diagnosis: Acute pain of right shoulder  Stiffness of right shoulder, not elsewhere classified  Muscle  weakness (generalized)  Other muscle spasm     Problem List Patient Active Problem List   Diagnosis Date Noted  . S/P TKR (total knee replacement), bilateral   . Gastroesophageal reflux disease   . Post-operative pain   . Elevated blood pressure reading   . Bilateral primary osteoarthritis of knee 07/03/2017  .  Lateral epicondylitis of right elbow 04/02/2013    Samantha Curtis, PTA 04/26/2020, 2:36 PM  [facility] [address] [place], [place], [address] Phone: [phone]   Fax:  [phone]  Name: Samantha Curtis MRN: [id number] Date of Birth: 1966-03-20

## 2020-04-27 MED FILL — NAPROXEN 500 MG TABLET: 500 | 30 days supply | Qty: 60 | Fill #1

## 2020-04-28 ENCOUNTER — Other Ambulatory Visit: Payer: Self-pay | Admitting: Obstetrics and Gynecology

## 2020-04-29 ENCOUNTER — Ambulatory Visit (HOSPITAL_COMMUNITY)
Admission: RE | Admit: 2020-04-29 | Discharge: 2020-04-29 | Disposition: A | Discharge status: Home / Self Care | Payer: No Typology Code available for payment source | Source: Ambulatory Visit | Attending: Obstetrics and Gynecology | Admitting: Obstetrics and Gynecology

## 2020-04-29 ENCOUNTER — Other Ambulatory Visit: Payer: Self-pay

## 2020-04-29 ENCOUNTER — Other Ambulatory Visit (HOSPITAL_COMMUNITY): Payer: Self-pay | Admitting: Obstetrics and Gynecology

## 2020-04-29 DIAGNOSIS — E278 Other specified disorders of adrenal gland: Secondary | ICD-10-CM | POA: Insufficient documentation

## 2020-05-04 ENCOUNTER — Other Ambulatory Visit: Payer: Self-pay

## 2020-05-04 ENCOUNTER — Encounter: Payer: Self-pay | Admitting: Physical Therapy

## 2020-05-04 ENCOUNTER — Ambulatory Visit: Payer: PRIVATE HEALTH INSURANCE | Admitting: Physical Therapy

## 2020-05-04 DIAGNOSIS — M25511 Pain in right shoulder: Secondary | ICD-10-CM

## 2020-05-04 DIAGNOSIS — R600 Localized edema: Secondary | ICD-10-CM

## 2020-05-04 DIAGNOSIS — M62838 Other muscle spasm: Secondary | ICD-10-CM

## 2020-05-04 DIAGNOSIS — M25611 Stiffness of right shoulder, not elsewhere classified: Secondary | ICD-10-CM | POA: Diagnosis not present

## 2020-05-04 DIAGNOSIS — M6281 Muscle weakness (generalized): Secondary | ICD-10-CM

## 2020-05-04 NOTE — Therapy (Signed)
[facility] [address], [place], [place], [address] Phone: [phone]   Fax:  [phone]  Physical Therapy Treatment  Patient Details  Name: Samantha Curtis MRN: [id number] Date of Birth: Nov 02, 1965 Referring Provider (PT): Melrose Nakayama, MD   Encounter Date: 05/04/2020   PT End of Session - 05/04/20 1403    Visit Number 9    Date for PT Re-Evaluation 06/01/20    Authorization - Number of Visits 9    Progress Note Due on Visit 9    PT Start Time 1401    PT Stop Time 1441    PT Time Calculation (min) 40 min    Activity Tolerance Patient tolerated treatment well    Behavior During Therapy [facility] for tasks assessed/performed           Past Medical History:  Diagnosis Date  . Arthritis    "knees" (07/04/2017)  . Dysrhythmia    PALPITATIONS  . GERD (gastroesophageal reflux disease)   . High cholesterol   . Leg pain     Past Surgical History:  Procedure Laterality Date  . ENDOVENOUS ABLATION SAPHENOUS VEIN W/ LASER Left 2005   [facility]  . JOINT REPLACEMENT    . KNEE ARTHROSCOPY Right   . SHOULDER ARTHROSCOPY Right    "spurs"  . SHOULDER ARTHROSCOPY Right 06/24/2019   Procedure: RIGHT SHOULDER ARTHROSCOPY; CHONDROPLASTY AND DEBRIEDMENT;  Surgeon: Melrose Nakayama, MD;  [facility];  Service: Orthopedics;  Laterality: Right;  . TOTAL KNEE ARTHROPLASTY Bilateral 07/03/2017   Procedure: TOTAL KNEE BILATERAL;  Surgeon: Melrose Nakayama, MD;  Location: [facility];  Service: Orthopedics;  Laterality: Bilateral;  . TUBAL LIGATION    . VAGINAL HYSTERECTOMY      There were no vitals filed for this visit.   Subjective Assessment - 05/04/20 1407    Subjective Shoulder is not bad today, just feel it stretching    Patient Stated Goals stop hurting    Currently in Pain? Yes    Pain Score 3     Pain Location Shoulder    Pain Orientation Right    Pain Descriptors / Indicators Sore    Pain Type Acute pain     Pain Onset More than a month ago    Pain Frequency Intermittent                             [facility] Adult PT Treatment/Exercise - 05/04/20 0001      Shoulder Exercises: Standing   Other Standing Exercises Red Rockwood 4 10x each, given for HEP      Shoulder Exercises: ROM/Strengthening   Nustep L1 x 5 min PT present for status update      Manual Therapy   Manual therapy comments RT upper trap, scalenes, SCM,  levator  TP release, RTC, subclavicularus, pec minor attachments   very gentle pressure all very TTP                   PT Short Term Goals - 04/13/20 1629      PT SHORT TERM GOAL #1   Title Pt will be independent with initial HEP to decrease inflammation and increase ROM.    Status Achieved      PT SHORT TERM GOAL #2   Title AROM Rt shoulder flexion 150    Baseline 160    Status Achieved  PT Long Term Goals - 05/04/20 1410      PT LONG TERM GOAL #1   Title Pt will have IR to at least 50 deg    Status Achieved      PT LONG TERM GOAL #2   Title Pt will be able to do overhead activities for at least 10 minutes without pain so she can do household activities    Status Achieved      PT LONG TERM GOAL #3   Title Pt will be able to sleep on the Rt shoulder without increased pain of more than 3/10    Baseline can tolerate a few minutes at a time    Status Partially Met      PT LONG TERM GOAL #4   Title Pt will be able to complete full shift at work with no more than 3/10 NPRS    Status Achieved                 Plan - 05/04/20 1414    Clinical Impression Statement Pt has met all goals other than sleeping on the Rt side.  Pt is doing well with the HEP.  She continues to demonstrate progress and demonstrates she can progress on her own at this time.  Pt will d/c from PT    PT Treatment/Interventions ADLs/Self Care Home Management;Cryotherapy;Electrical Stimulation;Iontophoresis 44m/ml Dexamethasone;Moist  Heat;Ultrasound;Therapeutic activities;Therapeutic exercise;Neuromuscular re-education;Manual techniques;Patient/family education;Taping;Dry needling;Passive range of motion    PT Next Visit Plan d/c today    PT Home Exercise Plan Access Code: [email or web address]    Consulted and Agree with Plan of Care Patient           Patient will benefit from skilled therapeutic intervention in order to improve the following deficits and impairments:  Decreased range of motion, Increased fascial restricitons, Pain, Postural dysfunction, Decreased strength, Impaired flexibility, Impaired UE functional use  Visit Diagnosis: Acute pain of right shoulder  Stiffness of right shoulder, not elsewhere classified  Muscle weakness (generalized)  Other muscle spasm  Localized edema     Problem List Patient Active Problem List   Diagnosis Date Noted  . S/P TKR (total knee replacement), bilateral   . Gastroesophageal reflux disease   . Post-operative pain   . Elevated blood pressure reading   . Bilateral primary osteoarthritis of knee 07/03/2017  . Lateral epicondylitis of right elbow 04/02/2013    JJule Ser PT 05/04/2020, 2:47 PM  [facility] [address] [place] [address]Phone: [phone]  Fax:  [phone] Name: Samantha TIBBITSMRN: [id number] of Birth: 424-Sep-1967 PHYSICAL THERAPY DISCHARGE SUMMARY  Visits from Start of Care: 9  Current functional level related to goals / functional outcomes: See above   Remaining deficits: See above   Education / Equipment: HEP  Plan: Patient agrees to discharge.  Patient goals were met. Patient is being discharged due to meeting the stated rehab goals.  ?????    [employer] PT 05/04/20 2:47 PM

## 2020-05-06 ENCOUNTER — Other Ambulatory Visit (HOSPITAL_COMMUNITY): Payer: Self-pay | Admitting: Obstetrics and Gynecology

## 2020-05-06 DIAGNOSIS — N83209 Unspecified ovarian cyst, unspecified side: Secondary | ICD-10-CM

## 2020-05-10 ENCOUNTER — Encounter (HOSPITAL_COMMUNITY): Payer: Self-pay

## 2020-05-10 ENCOUNTER — Ambulatory Visit (HOSPITAL_COMMUNITY): Payer: No Typology Code available for payment source

## 2020-05-18 MED FILL — METOPROLOL TARTRATE 25 MG T: 25 | 90 days supply | Qty: 90 | Fill #1

## 2020-05-26 ENCOUNTER — Other Ambulatory Visit: Payer: Self-pay

## 2020-05-26 ENCOUNTER — Ambulatory Visit
Admission: RE | Admit: 2020-05-26 | Discharge: 2020-05-26 | Disposition: A | Discharge status: Home / Self Care | Payer: No Typology Code available for payment source | Source: Ambulatory Visit | Attending: Internal Medicine | Admitting: Internal Medicine

## 2020-05-26 DIAGNOSIS — Z1231 Encounter for screening mammogram for malignant neoplasm of breast: Secondary | ICD-10-CM

## 2020-06-03 MED FILL — ATORVASTATIN CALCIUM 10 MG: 10 | 90 days supply | Qty: 90 | Fill #1

## 2020-06-10 ENCOUNTER — Other Ambulatory Visit (HOSPITAL_COMMUNITY): Payer: Self-pay | Admitting: Obstetrics and Gynecology

## 2020-06-10 ENCOUNTER — Ambulatory Visit (HOSPITAL_COMMUNITY)
Admission: RE | Admit: 2020-06-10 | Discharge: 2020-06-10 | Disposition: A | Discharge status: Home / Self Care | Payer: No Typology Code available for payment source | Source: Ambulatory Visit | Attending: Obstetrics and Gynecology | Admitting: Obstetrics and Gynecology

## 2020-06-10 ENCOUNTER — Other Ambulatory Visit: Payer: Self-pay

## 2020-06-10 DIAGNOSIS — N83209 Unspecified ovarian cyst, unspecified side: Secondary | ICD-10-CM | POA: Insufficient documentation

## 2020-06-10 MED FILL — AMOXICILLIN 500 MG CAPSULE: 500 | 7 days supply | Qty: 21 | Fill #0

## 2020-07-13 MED FILL — NAPROXEN 500 MG TABLET: 500 | 30 days supply | Qty: 60 | Fill #2

## 2020-08-13 MED FILL — METOPROLOL TARTRATE 25 MG T: 25 | 90 days supply | Qty: 90 | Fill #2

## 2020-08-26 ENCOUNTER — Other Ambulatory Visit (HOSPITAL_COMMUNITY): Payer: Self-pay | Admitting: Pharmacist

## 2020-08-26 MED FILL — CARESTART COVID-19 HOME TES: 4 days supply | Qty: 4 | Fill #0

## 2020-09-06 MED FILL — ATORVASTATIN CALCIUM 10 MG: 10 | 90 days supply | Qty: 90 | Fill #2

## 2020-09-08 ENCOUNTER — Other Ambulatory Visit (HOSPITAL_COMMUNITY): Payer: Self-pay | Admitting: Internal Medicine

## 2020-09-08 MED FILL — PANTOPRAZOLE SOD DR 40 MG T: 40 | 30 days supply | Qty: 60 | Fill #0

## 2020-09-29 ENCOUNTER — Other Ambulatory Visit (HOSPITAL_COMMUNITY): Payer: Self-pay

## 2020-10-12 ENCOUNTER — Other Ambulatory Visit (HOSPITAL_COMMUNITY): Payer: Self-pay

## 2020-10-12 MED FILL — Pantoprazole Sodium EC Tab 40 MG (Base Equiv): ORAL | 30 days supply | Qty: 60 | Fill #0 | Status: AC

## 2020-11-08 ENCOUNTER — Other Ambulatory Visit (HOSPITAL_COMMUNITY): Payer: Self-pay | Admitting: Orthopedic Surgery

## 2020-11-10 ENCOUNTER — Other Ambulatory Visit (HOSPITAL_COMMUNITY): Payer: Self-pay | Admitting: Orthopedic Surgery

## 2020-11-10 ENCOUNTER — Other Ambulatory Visit (HOSPITAL_COMMUNITY): Payer: Self-pay

## 2020-11-11 ENCOUNTER — Other Ambulatory Visit (HOSPITAL_COMMUNITY): Payer: Self-pay

## 2020-11-11 MED ORDER — NAPROXEN 500 MG PO TABS
500.0000 mg | ORAL_TABLET | Freq: Two times a day (BID) | ORAL | 2 refills | Status: DC | PRN
  Filled 2020-11-11: qty 60, 30d supply, fill #0
  Filled 2021-02-22: qty 60, 30d supply, fill #1
  Filled 2021-05-02: qty 60, 30d supply, fill #2

## 2020-11-15 MED FILL — Metoprolol Tartrate Tab 25 MG: ORAL | 90 days supply | Qty: 90 | Fill #0 | Status: AC

## 2020-11-16 ENCOUNTER — Other Ambulatory Visit (HOSPITAL_COMMUNITY): Payer: Self-pay

## 2020-12-01 ENCOUNTER — Other Ambulatory Visit (HOSPITAL_COMMUNITY): Payer: Self-pay

## 2020-12-01 MED FILL — Atorvastatin Calcium Tab 10 MG (Base Equivalent): ORAL | 90 days supply | Qty: 90 | Fill #0 | Status: AC

## 2020-12-08 ENCOUNTER — Other Ambulatory Visit (HOSPITAL_COMMUNITY): Payer: Self-pay

## 2021-02-13 ENCOUNTER — Other Ambulatory Visit (HOSPITAL_COMMUNITY): Payer: Self-pay

## 2021-02-14 ENCOUNTER — Other Ambulatory Visit (HOSPITAL_COMMUNITY): Payer: Self-pay

## 2021-02-14 MED ORDER — METOPROLOL TARTRATE 25 MG PO TABS
25.0000 mg | ORAL_TABLET | Freq: Every day | ORAL | 3 refills | Status: DC
  Filled 2021-02-14: qty 90, 90d supply, fill #0
  Filled 2021-05-13: qty 90, 90d supply, fill #1
  Filled 2021-08-09: qty 90, 90d supply, fill #2
  Filled 2021-11-10: qty 90, 90d supply, fill #3

## 2021-02-22 ENCOUNTER — Other Ambulatory Visit (HOSPITAL_COMMUNITY): Payer: Self-pay

## 2021-02-23 ENCOUNTER — Other Ambulatory Visit (HOSPITAL_COMMUNITY): Payer: Self-pay

## 2021-02-23 MED ORDER — ATORVASTATIN CALCIUM 10 MG PO TABS
10.0000 mg | ORAL_TABLET | Freq: Every day | ORAL | 3 refills | Status: DC
  Filled 2021-02-23: qty 90, 90d supply, fill #0
  Filled 2021-05-13: qty 90, 90d supply, fill #1
  Filled 2021-08-09: qty 90, 90d supply, fill #2
  Filled 2021-12-03: qty 90, 90d supply, fill #3

## 2021-02-24 ENCOUNTER — Other Ambulatory Visit (HOSPITAL_COMMUNITY): Payer: Self-pay

## 2021-02-24 MED ORDER — QUICKVUE AT-HOME COVID-19 TEST VI KIT
PACK | 0 refills | Status: AC
  Filled 2021-02-24: qty 2, 2d supply, fill #0

## 2021-03-02 ENCOUNTER — Other Ambulatory Visit (HOSPITAL_COMMUNITY): Payer: Self-pay | Admitting: Internal Medicine

## 2021-03-02 DIAGNOSIS — R1310 Dysphagia, unspecified: Secondary | ICD-10-CM

## 2021-03-03 ENCOUNTER — Other Ambulatory Visit: Payer: Self-pay

## 2021-03-03 ENCOUNTER — Other Ambulatory Visit: Payer: Self-pay | Admitting: Internal Medicine

## 2021-03-03 ENCOUNTER — Other Ambulatory Visit (HOSPITAL_COMMUNITY): Payer: Self-pay | Admitting: Internal Medicine

## 2021-03-03 ENCOUNTER — Other Ambulatory Visit (HOSPITAL_COMMUNITY): Payer: No Typology Code available for payment source

## 2021-03-03 ENCOUNTER — Ambulatory Visit (HOSPITAL_COMMUNITY)
Admission: RE | Admit: 2021-03-03 | Discharge: 2021-03-03 | Disposition: A | Discharge status: Home / Self Care | Payer: No Typology Code available for payment source | Source: Ambulatory Visit | Attending: Internal Medicine | Admitting: Internal Medicine

## 2021-03-03 DIAGNOSIS — R1310 Dysphagia, unspecified: Secondary | ICD-10-CM | POA: Diagnosis not present

## 2021-03-03 DIAGNOSIS — R6881 Early satiety: Secondary | ICD-10-CM

## 2021-03-04 ENCOUNTER — Other Ambulatory Visit (HOSPITAL_COMMUNITY): Payer: Self-pay

## 2021-03-04 MED ORDER — PANTOPRAZOLE SODIUM 40 MG PO TBEC
40.0000 mg | DELAYED_RELEASE_TABLET | ORAL | 5 refills | Status: DC
  Filled 2021-03-04: qty 60, 30d supply, fill #0

## 2021-03-07 ENCOUNTER — Telehealth (HOSPITAL_COMMUNITY): Payer: Self-pay

## 2021-03-07 NOTE — Telephone Encounter (Signed)
Called and spoke with patient to schedule OP MBS - patient stated she does not want to schedule at this time due to a new medication she is taking. She stated she will contact MD if she feels she needs test. Closed order.

## 2021-03-10 ENCOUNTER — Other Ambulatory Visit (HOSPITAL_COMMUNITY): Payer: Self-pay

## 2021-04-11 ENCOUNTER — Other Ambulatory Visit: Payer: Self-pay | Admitting: Internal Medicine

## 2021-04-11 DIAGNOSIS — Z1231 Encounter for screening mammogram for malignant neoplasm of breast: Secondary | ICD-10-CM

## 2021-04-12 ENCOUNTER — Other Ambulatory Visit (HOSPITAL_COMMUNITY): Payer: Self-pay

## 2021-04-12 MED ORDER — PREVIDENT 5000 ENAMEL PROTECT 1.1-5 % DT GEL
DENTAL | 1 refills | Status: DC
  Filled 2021-04-12: qty 100, 20d supply, fill #0

## 2021-04-13 ENCOUNTER — Other Ambulatory Visit (HOSPITAL_COMMUNITY): Payer: Self-pay

## 2021-04-14 ENCOUNTER — Other Ambulatory Visit (HOSPITAL_COMMUNITY): Payer: Self-pay

## 2021-04-18 ENCOUNTER — Other Ambulatory Visit (HOSPITAL_COMMUNITY): Payer: Self-pay

## 2021-04-19 ENCOUNTER — Other Ambulatory Visit (HOSPITAL_COMMUNITY): Payer: Self-pay

## 2021-04-27 ENCOUNTER — Telehealth: Payer: Self-pay | Admitting: *Deleted

## 2021-04-27 ENCOUNTER — Other Ambulatory Visit: Payer: Self-pay

## 2021-04-27 ENCOUNTER — Ambulatory Visit: Payer: No Typology Code available for payment source | Admitting: Podiatry

## 2021-04-27 ENCOUNTER — Other Ambulatory Visit (HOSPITAL_COMMUNITY): Payer: Self-pay

## 2021-04-27 DIAGNOSIS — I517 Cardiomegaly: Secondary | ICD-10-CM | POA: Insufficient documentation

## 2021-04-27 DIAGNOSIS — I493 Ventricular premature depolarization: Secondary | ICD-10-CM | POA: Insufficient documentation

## 2021-04-27 DIAGNOSIS — IMO0002 Reserved for concepts with insufficient information to code with codable children: Secondary | ICD-10-CM | POA: Insufficient documentation

## 2021-04-27 DIAGNOSIS — G2581 Restless legs syndrome: Secondary | ICD-10-CM | POA: Insufficient documentation

## 2021-04-27 DIAGNOSIS — K2990 Gastroduodenitis, unspecified, without bleeding: Secondary | ICD-10-CM | POA: Insufficient documentation

## 2021-04-27 DIAGNOSIS — L600 Ingrowing nail: Secondary | ICD-10-CM

## 2021-04-27 DIAGNOSIS — J45991 Cough variant asthma: Secondary | ICD-10-CM | POA: Insufficient documentation

## 2021-04-27 DIAGNOSIS — E7800 Pure hypercholesterolemia, unspecified: Secondary | ICD-10-CM | POA: Insufficient documentation

## 2021-04-27 DIAGNOSIS — J309 Allergic rhinitis, unspecified: Secondary | ICD-10-CM | POA: Insufficient documentation

## 2021-04-27 DIAGNOSIS — M179 Osteoarthritis of knee, unspecified: Secondary | ICD-10-CM | POA: Insufficient documentation

## 2021-04-27 MED ORDER — GENTAMICIN SULFATE 0.1 % EX CREA
1.0000 "application " | TOPICAL_CREAM | Freq: Two times a day (BID) | CUTANEOUS | 1 refills | Status: DC
  Filled 2021-04-27: qty 30, 15d supply, fill #0

## 2021-04-27 NOTE — Progress Notes (Signed)
   Subjective: Patient presents today for evaluation of pain to the lateral border of the bilateral great toes. Patient is concerned for possible ingrown nail.  It is very sensitive to touch.  Patient states that she has had ingrown toenails for several years and she normally tries to treat and manage them herself.  The pain is slowly increased over the past year and she presents to discuss the nail avulsion procedure.  She states that she is not ready to have the surgery performed today but would like speculation about it.    Past Medical History:  Diagnosis Date   Arthritis    "knees" (07/04/2017)   Dysrhythmia    PALPITATIONS   GERD (gastroesophageal reflux disease)    High cholesterol    Leg pain     Objective:  General: Well developed, nourished, in no acute distress, alert and oriented x3   Dermatology: Skin is warm, dry and supple bilateral.  Lateral border of the bilateral great toes appears to be erythematous with evidence of an ingrowing nail. Pain on palpation noted to the border of the nail fold. The remaining nails appear unremarkable at this time. There are no open sores, lesions.  Vascular: Dorsalis Pedis artery and Posterior Tibial artery pedal pulses palpable. No lower extremity edema noted.   Neruologic: Grossly intact via light touch bilateral.  Musculoskeletal: Muscular strength within normal limits in all groups bilateral. Normal range of motion noted to all pedal and ankle joints.   Assesement: #1 Paronychia with ingrowing nail lateral border bilateral great toe #2 Pain in toe  Plan of Care:  1. Patient evaluated.  2. Discussed treatment alternatives and plan of care. Explained nail avulsion procedure and post procedure course to patient. 3. Patient opted for permanent partial nail avulsion of the ingrown portion of the nail.  The patient would like to take the following day off of work.  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  She would  like to reschedule for next week so she can arrange to have the following day off of work 4.  Return to clinic in 1 week.  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 Felecia Shelling, DPM    [address] [place], [place] [address]                Office [phone]  Fax [phone]

## 2021-04-27 NOTE — Telephone Encounter (Signed)
Patient is having an ingrown procedure on upcoming appointment but she would have to pre certified first, will cover 80/20.

## 2021-04-28 NOTE — Telephone Encounter (Signed)
Called [facility] and patient does require Prior authorization since the procedure is 1000.00 or over,per Vickie(620.00),contact person-Mary T,reference call [id number].patient notified. Authorization #:[id number] from 05/09/21-06/08/21

## 2021-05-02 ENCOUNTER — Other Ambulatory Visit (HOSPITAL_COMMUNITY): Payer: Self-pay

## 2021-05-03 ENCOUNTER — Other Ambulatory Visit: Payer: Self-pay

## 2021-05-03 ENCOUNTER — Encounter: Payer: Self-pay | Admitting: Orthopaedic Surgery

## 2021-05-03 ENCOUNTER — Other Ambulatory Visit (HOSPITAL_COMMUNITY): Payer: Self-pay

## 2021-05-03 ENCOUNTER — Ambulatory Visit: Payer: No Typology Code available for payment source | Admitting: Orthopaedic Surgery

## 2021-05-03 ENCOUNTER — Ambulatory Visit (INDEPENDENT_AMBULATORY_CARE_PROVIDER_SITE_OTHER): Payer: No Typology Code available for payment source

## 2021-05-03 DIAGNOSIS — Z96651 Presence of right artificial knee joint: Secondary | ICD-10-CM | POA: Diagnosis not present

## 2021-05-03 MED ORDER — METHOCARBAMOL 500 MG PO TABS
500.0000 mg | ORAL_TABLET | Freq: Four times a day (QID) | ORAL | 2 refills | Status: DC | PRN
  Filled 2021-05-03: qty 30, 8d supply, fill #0

## 2021-05-03 NOTE — Progress Notes (Addendum)
Office Visit Note   Patient: Samantha Curtis           Date of Birth: 1965-10-20           MRN: [id number] Visit Date: 05/03/2021              Requested by: Samantha Funk, MD [address] [place],  [place] [address] PCP: Samantha Funk, MD   Assessment & Plan: Visit Diagnoses:  1. Status post total right knee replacement     Plan: Based on my findings and review of her records dating all the way back from 2019 from [facility] my impression is extension and mid flexion instability.  Options were discussed to include hinged knee brace versus upsizing the polyethylene liner.  She has undergone extensive work-up for infection and I feel that infection has been ruled out.  I do not feel that the primary problem is loosening of the components.  She will take time to think about her options and let us know.  Follow-Up Instructions: No follow-ups on file.   Orders:  Orders Placed This Encounter  Procedures   XR KNEE 3 VIEW RIGHT   Meds ordered this encounter  Medications   methocarbamol (ROBAXIN) 500 MG tablet    Sig: Take 1 tablet (500 mg total) by mouth every 6 (six) hours as needed for muscle spasms.    Dispense:  30 tablet    Refill:  2      Procedures: No procedures performed   Clinical Data: No additional findings.   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 Jerl Curtis.  She had an uneventful recovery on the left knee but the right knee required a manipulation in 6 weeks.  She subsequently over the next couple years developed increasing swelling and difficulty with activity.  She had multiple aspirations which revealed no evidence of  infection except for the last aspiration which grew Moraxella on a culture.  She also had a bone scan in 2020 which did not really show any loosening.  Denies any constitutional symptoms.  She underwent right knee arthroscopy and intraoperative cultures in 2021.  These cultures were also negative.  Her most recent aspiration showed a cell count of 815 white blood cells.  She notes increased swelling and discomfort secondary to the swelling with increased activity especially at work since she is on her feet during the entire shift.  She has done extensive physical therapy without significant improvement.   Review of Systems  Constitutional: Negative.   HENT: Negative.    Eyes: Negative.   Respiratory: Negative.    Cardiovascular: Negative.   Endocrine: Negative.   Musculoskeletal: Negative.   Neurological: Negative.   Hematological: Negative.   Psychiatric/Behavioral: Negative.    All other systems reviewed and are negative.   Objective: Vital Signs: There were no vitals taken for this visit.  Physical Exam Vitals and nursing note reviewed.  Constitutional:      Appearance: She is well-developed.  Pulmonary:     Effort: Pulmonary effort is normal.  Skin:    General: Skin is warm.     Capillary Refill: Capillary refill takes less  than 2 seconds.  Neurological:     Mental Status: She is alert and oriented to person, place, and time.  Psychiatric:        Behavior: Behavior normal.        Thought Content: Thought content normal.        Judgment: Judgment normal.    Ortho Exam  Right knee shows swelling and effusion.  Range of motion is 2 to 90 degrees with moderate to severe pain past 90 degrees.  I do feel valgus laxity of approximately 10 mm and varus laxity of about 5 to 8 mm.  There is no warmth to the skin or the knee.  Specialty Comments:  No specialty comments available.  Imaging: XR KNEE 3 VIEW RIGHT  Result Date: 05/03/2021 Small lucencies between cement bone  interface towards the medial and lateral periphery of the tibial baseplate.  Femoral component is unremarkable.    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   S/P TKR (total knee replacement), bilateral    Gastroesophageal reflux disease    Post-operative pain    Elevated blood pressure reading    Bilateral primary osteoarthritis of knee 07/03/2017   Chest pain 08/31/2014   Palpitations 08/31/2014   Lateral epicondylitis of right elbow 04/02/2013   Past Medical History:  Diagnosis Date   Arthritis    "knees" (07/04/2017)   Dysrhythmia    PALPITATIONS   GERD (gastroesophageal reflux disease)    High cholesterol    Leg pain     Family History  Problem Relation Age of Onset   Dementia Mother     Past Surgical History:  Procedure Laterality Date   ENDOVENOUS ABLATION SAPHENOUS VEIN W/ LASER Left 2005   [facility]   JOINT REPLACEMENT     KNEE ARTHROSCOPY Right    SHOULDER ARTHROSCOPY Right    "spurs"   SHOULDER ARTHROSCOPY Right 06/24/2019   Procedure: RIGHT SHOULDER ARTHROSCOPY; CHONDROPLASTY AND DEBRIEDMENT;  Surgeon: Marcene Corning, MD;  Location: [facility];  Service: Orthopedics;  Laterality: Right;   TOTAL KNEE ARTHROPLASTY Bilateral 07/03/2017   Procedure: TOTAL KNEE BILATERAL;  Surgeon: Marcene Corning, MD;  Location: [facility];  Service: Orthopedics;  Laterality: Bilateral;   TUBAL LIGATION     VAGINAL HYSTERECTOMY     Social History   Occupational History   Not on file  Tobacco Use   Smoking status: Never   Smokeless tobacco: Never  Vaping Use   Vaping Use: Never used  Substance and Sexual Activity   Alcohol  use: No   Drug use: No   Sexual activity: Yes

## 2021-05-03 NOTE — Addendum Note (Signed)
Addended by: Mayra Reel on: 05/03/2021 04:06 PM   Modules accepted: Orders

## 2021-05-04 ENCOUNTER — Ambulatory Visit (INDEPENDENT_AMBULATORY_CARE_PROVIDER_SITE_OTHER): Payer: No Typology Code available for payment source | Admitting: Podiatry

## 2021-05-04 ENCOUNTER — Other Ambulatory Visit (HOSPITAL_COMMUNITY): Payer: Self-pay

## 2021-05-04 DIAGNOSIS — L600 Ingrowing nail: Secondary | ICD-10-CM | POA: Diagnosis not present

## 2021-05-04 MED ORDER — SULFAMETHOXAZOLE-TRIMETHOPRIM 800-160 MG PO TABS
1.0000 | ORAL_TABLET | Freq: Two times a day (BID) | ORAL | 0 refills | Status: DC
  Filled 2021-05-04: qty 10, 5d supply, fill #0

## 2021-05-04 NOTE — Patient Instructions (Signed)

## 2021-05-09 ENCOUNTER — Ambulatory Visit: Payer: No Typology Code available for payment source | Admitting: Podiatry

## 2021-05-13 NOTE — Progress Notes (Signed)
   Subjective: Patient presents today for evaluation of pain to the lateral border bilateral great toes. Patient is concerned for possible ingrown nail.  It is very sensitive to touch.  She was last seen in the office 04/27/2021 but she was not in a position to pursue the procedure at that time.  She was prescribed the gentamicin cream and she has been applying it.  She is ready to proceed with the nail avulsion procedures today.  No significant change since last visit patient presents today for further treatment and evaluation.  Past Medical History:  Diagnosis Date   Arthritis    "knees" (07/04/2017)   Dysrhythmia    PALPITATIONS   GERD (gastroesophageal reflux disease)    High cholesterol    Leg pain     Objective:  General: Well developed, nourished, in no acute distress, alert and oriented x3   Dermatology: Skin is warm, dry and supple bilateral.  Lateral border bilateral great toes appears to be erythematous with evidence of an ingrowing nail. Pain on palpation noted to the border of the nail fold. The remaining nails appear unremarkable at this time. There are no open sores, lesions.  Vascular: Dorsalis Pedis artery and Posterior Tibial artery pedal pulses palpable. No lower extremity edema noted.   Neruologic: Grossly intact via light touch bilateral.  Musculoskeletal: Muscular strength within normal limits in all groups bilateral. Normal range of motion noted to all pedal and ankle joints.   Assesement: #1 Paronychia with ingrowing nail lateral border bilateral great toes #2 Pain in toe  Plan of Care:  1. Patient evaluated.  2. Discussed treatment alternatives and plan of care. Explained nail avulsion procedure and post procedure course to patient. 3. Patient opted for permanent partial nail avulsion of the ingrown portion of the nail.  4. Prior to procedure, local anesthesia infiltration utilized using 3 ml of a 50:50 mixture of 2% plain lidocaine and 0.5% plain marcaine in  a normal hallux block fashion and a betadine prep performed.  5. Partial permanent nail avulsion with chemical matrixectomy performed using 3x30sec applications of phenol followed by alcohol flush.  6. Light dressing applied.  Post care instructions provided 7.  Continue gentamicin 2% cream 8.  Return to clinic 2 weeks.  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 Felecia Shelling, DPM    [address] [place], [place] [address]                Office [phone]  Fax [phone]

## 2021-05-16 ENCOUNTER — Other Ambulatory Visit (HOSPITAL_COMMUNITY): Payer: Self-pay

## 2021-05-18 ENCOUNTER — Ambulatory Visit (INDEPENDENT_AMBULATORY_CARE_PROVIDER_SITE_OTHER): Payer: No Typology Code available for payment source | Admitting: Podiatry

## 2021-05-18 ENCOUNTER — Other Ambulatory Visit: Payer: Self-pay

## 2021-05-18 DIAGNOSIS — L600 Ingrowing nail: Secondary | ICD-10-CM

## 2021-05-18 NOTE — Progress Notes (Signed)
   Subjective: 55 y.o. female presents today status post permanent nail avulsion procedure of the lateral border bilateral great toes that was performed on 05/04/2021.  Patient states that she is doing very well.  She has been soaking her foot and applying antibiotic cream as instructed.  No new complaints at this time.   Past Medical History:  Diagnosis Date   Arthritis    "knees" (07/04/2017)   Dysrhythmia    PALPITATIONS   GERD (gastroesophageal reflux disease)    High cholesterol    Leg pain     Objective: Skin is warm, dry and supple. Nail and respective nail fold appears to be healing appropriately. Open wound to the associated nail fold with a granular wound base and moderate amount of fibrotic tissue. Minimal drainage noted. Mild erythema around the periungual region likely due to phenol chemical matricectomy.  Assessment: #1 s/p partial permanent nail matrixectomy lateral border bilateral great toes   Plan of care: #1 patient was evaluated  #2 light debridement of open wound was performed to the periungual border of the respective toe using a currette. Antibiotic ointment and Band-Aid was applied. #3 patient is to return to clinic on a PRN basis.  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 Felecia Shelling, DPM    [address] [place], [place] [address]                Office [phone]  Fax [phone]

## 2021-05-27 ENCOUNTER — Ambulatory Visit: Payer: No Typology Code available for payment source

## 2021-05-30 ENCOUNTER — Ambulatory Visit
Admission: RE | Admit: 2021-05-30 | Discharge: 2021-05-30 | Disposition: A | Discharge status: Home / Self Care | Payer: No Typology Code available for payment source | Source: Ambulatory Visit | Attending: Internal Medicine | Admitting: Internal Medicine

## 2021-05-30 ENCOUNTER — Other Ambulatory Visit: Payer: Self-pay | Admitting: Obstetrics and Gynecology

## 2021-05-30 DIAGNOSIS — Z1231 Encounter for screening mammogram for malignant neoplasm of breast: Secondary | ICD-10-CM

## 2021-06-06 ENCOUNTER — Other Ambulatory Visit (HOSPITAL_COMMUNITY): Payer: Self-pay

## 2021-06-06 MED ORDER — FINASTERIDE 1 MG PO TABS
2.0000 mg | ORAL_TABLET | Freq: Every day | ORAL | 11 refills | Status: DC
  Filled 2021-06-06: qty 60, 30d supply, fill #0
  Filled 2021-07-18: qty 60, 30d supply, fill #1
  Filled 2021-08-19: qty 60, 30d supply, fill #2
  Filled 2021-10-22: qty 60, 30d supply, fill #3
  Filled 2021-11-24: qty 60, 30d supply, fill #4
  Filled 2021-12-20: qty 60, 30d supply, fill #5
  Filled 2022-04-04: qty 60, 30d supply, fill #6
  Filled 2022-05-03: qty 60, 30d supply, fill #7

## 2021-06-30 ENCOUNTER — Ambulatory Visit: Payer: No Typology Code available for payment source

## 2021-07-12 ENCOUNTER — Ambulatory Visit: Payer: No Typology Code available for payment source | Admitting: Orthopaedic Surgery

## 2021-07-12 ENCOUNTER — Ambulatory Visit (INDEPENDENT_AMBULATORY_CARE_PROVIDER_SITE_OTHER): Payer: No Typology Code available for payment source

## 2021-07-12 ENCOUNTER — Other Ambulatory Visit: Payer: Self-pay

## 2021-07-12 DIAGNOSIS — Z96651 Presence of right artificial knee joint: Secondary | ICD-10-CM

## 2021-07-12 DIAGNOSIS — M65332 Trigger finger, left middle finger: Secondary | ICD-10-CM

## 2021-07-12 DIAGNOSIS — M25561 Pain in right knee: Secondary | ICD-10-CM | POA: Diagnosis not present

## 2021-07-12 MED ORDER — METHYLPREDNISOLONE ACETATE 40 MG/ML IJ SUSP
13.3300 mg | INTRAMUSCULAR | Status: AC | PRN
  Administered 2021-07-12: 12:00:00 13.33 mg

## 2021-07-12 MED ORDER — LIDOCAINE HCL 1 % IJ SOLN
0.3000 mL | INTRAMUSCULAR | Status: AC | PRN
  Administered 2021-07-12: 12:00:00 .3 mL

## 2021-07-12 MED ORDER — BUPIVACAINE HCL 0.5 % IJ SOLN
0.3300 mL | INTRAMUSCULAR | Status: AC | PRN
  Administered 2021-07-12: 12:00:00 .33 mL

## 2021-07-12 NOTE — Progress Notes (Signed)
Office Visit Note   Patient: Samantha Curtis           Date of Birth: [birth year]           MRN: [id number] Visit Date: 07/12/2021              Requested by: [place], MD [address] [place],  [place] [address] PCP: [place] Funk, MD   Assessment & Plan: Visit Diagnoses:  1. Status post total right knee replacement   2. Trigger finger, left middle finger     Plan: Aaliyah is well known to me who comes in for evaluation of acute injury to the right knee and left middle trigger finger.  In regards to the right knee she has had some issues since the knee replacement which is well-documented but yesterday she missed a step and that the knee was forcefully flexed backwards.  She has had to take off from work today.  She developed worsening swelling and pain with weightbearing.  She is able to fully extend her knee.  The pain is worse with activity and standing.  For the left long finger she wakes up with catching and pain and she is experiencing pain with extension of the PIP joint.  Examination of the right knee shows a fully healed surgical scar.  She does have moderate joint effusion.  No signs of infection.  Extensor mechanism intact.  Pain with flexion of the right knee past 75 degrees.  Varus valgus laxity is unchanged compared to prior exam. Examination of the left long finger shows a tender nodule in the palm overlying the A1 pulley.  There is active triggering and catching producing pain.  She lacks about 10 degrees of full extension at the PIP joint.  In terms of the right knee she does have a traumatic effusion from the fall yesterday.  I offered aspiration but she politely declined.  We again talked about definitive treatment for her right knee instability which would require upsizing the polyliner.  She will think about her options.  In the meantime I do recommend that she wear a hinged knee brace at all times as she is experiencing giving way.  For the long finger  trigger finger injection performed today.  She will try to rest this hand as much as she can.  We will see her back as needed.  Follow-Up Instructions: No follow-ups on file.   Orders:  Orders Placed This Encounter  Procedures   Hand/UE Inj   XR KNEE 3 VIEW RIGHT   No orders of the defined types were placed in this encounter.     Procedures: Hand/UE Inj: L long A1 for trigger finger on 07/12/2021 11:41 AM Indications: pain Details: 25 G needle Medications: 0.3 mL lidocaine 1 %; 0.33 mL bupivacaine 0.5 %; 13.33 mg methylPREDNISolone acetate 40 MG/ML Outcome: tolerated well, no immediate complications Consent was given by the patient. Patient was prepped and draped in the usual sterile fashion.      Clinical Data: No additional findings.   Subjective: Chief Complaint  Patient presents with   Right Knee - Pain   Left Hand - Pain    HPI  Review of Systems   Objective: Vital Signs: There were no vitals taken for this visit.  Physical Exam  Ortho Exam  Specialty Comments:  No specialty comments available.  Imaging: XR KNEE 3 VIEW RIGHT  Result Date: 07/12/2021 Stable total knee replacement without complication.    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   S/P TKR (total knee replacement), bilateral    Gastroesophageal reflux disease    Post-operative pain    Elevated blood pressure reading    Bilateral primary osteoarthritis of knee 07/03/2017   Chest pain 08/31/2014   Palpitations 08/31/2014   Lateral epicondylitis of right elbow 04/02/2013   Past Medical History:  Diagnosis Date   Arthritis     "knees" (07/04/2017)   Dysrhythmia    PALPITATIONS   GERD (gastroesophageal reflux disease)    High cholesterol    Leg pain     Family History  Problem Relation Age of Onset   Dementia Mother     Past Surgical History:  Procedure Laterality Date   ENDOVENOUS ABLATION SAPHENOUS VEIN W/ LASER Left 2005   [facility]   JOINT REPLACEMENT     KNEE ARTHROSCOPY Right    SHOULDER ARTHROSCOPY Right    "spurs"   SHOULDER ARTHROSCOPY Right 06/24/2019   Procedure: RIGHT SHOULDER ARTHROSCOPY; CHONDROPLASTY AND DEBRIEDMENT;  Surgeon: Marcene Corning, MD;  Location: [facility];  Service: Orthopedics;  Laterality: Right;   TOTAL KNEE ARTHROPLASTY Bilateral 07/03/2017   Procedure: TOTAL KNEE BILATERAL;  Surgeon: Marcene Corning, MD;  Location: [facility];  Service: Orthopedics;  Laterality: Bilateral;   TUBAL LIGATION     VAGINAL HYSTERECTOMY     Social History   Occupational History   Not on file  Tobacco Use   Smoking status: Never   Smokeless tobacco: Never  Vaping Use   Vaping Use: Never used  Substance and Sexual Activity   Alcohol use: No   Drug use: No   Sexual activity: Yes

## 2021-07-18 ENCOUNTER — Other Ambulatory Visit (HOSPITAL_COMMUNITY): Payer: Self-pay

## 2021-07-18 MED ORDER — CARESTART COVID-19 HOME TEST VI KIT
PACK | 0 refills | Status: AC
  Filled 2021-07-18: qty 4, 4d supply, fill #0

## 2021-07-28 ENCOUNTER — Other Ambulatory Visit (HOSPITAL_COMMUNITY): Payer: Self-pay

## 2021-08-04 ENCOUNTER — Encounter: Payer: Self-pay | Admitting: Orthopaedic Surgery

## 2021-08-04 DIAGNOSIS — Z96651 Presence of right artificial knee joint: Secondary | ICD-10-CM | POA: Insufficient documentation

## 2021-08-09 ENCOUNTER — Other Ambulatory Visit (HOSPITAL_COMMUNITY): Payer: Self-pay

## 2021-08-17 ENCOUNTER — Telehealth: Payer: Self-pay | Admitting: Orthopaedic Surgery

## 2021-08-17 NOTE — Telephone Encounter (Signed)
[employer] forms received. To [employer]. 

## 2021-08-18 ENCOUNTER — Telehealth: Payer: Self-pay | Admitting: Orthopaedic Surgery

## 2021-08-18 NOTE — Telephone Encounter (Signed)
Received $25.00 cash and medical records release form/Forwarding to [employer] today 

## 2021-08-19 ENCOUNTER — Other Ambulatory Visit (HOSPITAL_COMMUNITY): Payer: Self-pay

## 2021-08-22 ENCOUNTER — Other Ambulatory Visit: Payer: Self-pay | Admitting: Physician Assistant

## 2021-08-22 ENCOUNTER — Other Ambulatory Visit (HOSPITAL_COMMUNITY): Payer: Self-pay

## 2021-08-22 ENCOUNTER — Telehealth: Payer: Self-pay | Admitting: Orthopaedic Surgery

## 2021-08-22 MED ORDER — OXYCODONE-ACETAMINOPHEN 5-325 MG PO TABS
1.0000 | ORAL_TABLET | Freq: Four times a day (QID) | ORAL | 0 refills | Status: DC | PRN
  Filled 2021-08-22: qty 40, 5d supply, fill #0

## 2021-08-22 MED ORDER — ASPIRIN EC 81 MG PO TBEC
81.0000 mg | DELAYED_RELEASE_TABLET | Freq: Two times a day (BID) | ORAL | 0 refills | Status: AC
  Filled 2021-08-22: qty 84, 42d supply, fill #0

## 2021-08-22 MED ORDER — ONDANSETRON HCL 4 MG PO TABS
4.0000 mg | ORAL_TABLET | Freq: Three times a day (TID) | ORAL | 0 refills | Status: AC | PRN
  Filled 2021-08-22: qty 40, 14d supply, fill #0

## 2021-08-22 MED ORDER — DOCUSATE SODIUM 100 MG PO CAPS
100.0000 mg | ORAL_CAPSULE | Freq: Every day | ORAL | 2 refills | Status: AC | PRN
  Filled 2021-08-22: qty 30, 30d supply, fill #0

## 2021-08-22 MED ORDER — METHOCARBAMOL 500 MG PO TABS
500.0000 mg | ORAL_TABLET | Freq: Two times a day (BID) | ORAL | 0 refills | Status: DC | PRN
  Filled 2021-08-22: qty 20, 10d supply, fill #0

## 2021-08-22 NOTE — Progress Notes (Signed)
Surgical Instructions ? ? ? Your procedure is scheduled on Friday, March 10th, 2023. ? ? Report to [facility] Main Entrance "A" at 10:00 A.M., then check in with the Admitting office. ? Call this number if you have problems the morning of surgery: ? [phone] ? ? If you have any questions prior to your surgery date call [phone]: Open Monday-Friday 8am-4pm ? ? ? Remember: ? Do not eat after midnight the night before your surgery ? ?You may drink clear liquids until 09:00 the morning of your surgery.   ?Clear liquids allowed are: Water, Non-Citrus Juices (without pulp), Carbonated Beverages, Clear Tea, Black Coffee ONLY (NO MILK, CREAM OR POWDERED CREAMER of any kind), and Gatorade ? ?Patient Instructions ? ?The night before surgery:  ?No food after midnight. ONLY clear liquids after midnight ? ?The day of surgery (if you do NOT have diabetes):  ?Drink ONE (1) Pre-Surgery Clear Ensure by 09:00 the morning of surgery. Drink in one sitting. Do not sip.  ?This drink was given to you during your hospital  ?pre-op appointment visit. ? ?Nothing else to drink after completing the  ?Pre-Surgery Clear Ensure. ? ?       If you have questions, please contact your surgeon?s office.  ?  ? Take these medicines the morning of surgery with A SIP OF WATER:  ? ?atorvastatin (LIPITOR)  ?cetirizine (ZYRTEC)  ?famotidine (PEPCID) ?finasteride (PROPECIA) ?metoprolol tartrate (LOPRESSOR)  ? ?If needed: ? ?acetaminophen (TYLENOL)  ?ondansetron [facility])  ? ?As of today, STOP taking any Aspirin (unless otherwise instructed by your surgeon) Aleve, Naproxen, Ibuprofen, Motrin, Advil, Goody's, BC's, all herbal medications, fish oil, and all vitamins. ? ? ? The day of surgery: ?         ?Do not wear jewelry or makeup ?Do not wear lotions, powders, perfumes, or deodorant. ?Do not shave 48 hours prior to surgery.   ?Do not bring valuables to the hospital. ?Do not wear nail polish, gel polish, artificial nails, or any other type of covering  on natural nails (fingers and toes) ?If you have artificial nails or gel coating that need to be removed by a nail salon, please have this removed prior to surgery. Artificial nails or gel coating may interfere with anesthesia's ability to adequately monitor your vital signs. ? ?[facility] is not responsible for any belongings or valuables. .  ? ?Do NOT Smoke (Tobacco/Vaping)  24 hours prior to your procedure ? ?If you use a CPAP at night, you may bring your mask for your overnight stay. ?  ?Contacts, glasses, hearing aids, dentures or partials may not be worn into surgery, please bring cases for these belongings ?  ?For patients admitted to the hospital, discharge time will be determined by your treatment team. ?  ?Patients discharged the day of surgery will not be allowed to drive home, and someone needs to stay with them for 24 hours. ? ?NO VISITORS WILL BE ALLOWED IN PRE-OP WHERE PATIENTS ARE PREPPED FOR SURGERY.  ONLY 1 SUPPORT PERSON MAY BE PRESENT IN THE WAITING ROOM WHILE YOU ARE IN SURGERY.  IF YOU ARE TO BE ADMITTED, ONCE YOU ARE IN YOUR ROOM YOU WILL BE ALLOWED TWO (2) VISITORS. 1 (ONE) VISITOR MAY STAY OVERNIGHT BUT MUST ARRIVE TO THE ROOM BY 8pm.  Minor children may have two parents present. Special consideration for safety and communication needs will be reviewed on a case by case basis. ? ?Special instructions:   ? ?Oral Hygiene is also important to reduce  your risk of infection.  Remember - BRUSH YOUR TEETH THE MORNING OF SURGERY WITH YOUR REGULAR TOOTHPASTE ? ? ?[facility]- Preparing For Surgery ? ?Before surgery, you can play an important role. Because skin is not sterile, your skin needs to be as free of germs as possible. You can reduce the number of germs on your skin by washing with CHG (chlorahexidine gluconate) Soap before surgery.  CHG is an antiseptic cleaner which kills germs and bonds with the skin to continue killing germs even after washing.   ? ? ?Please do not use if you have an  allergy to CHG or antibacterial soaps. If your skin becomes reddened/irritated stop using the CHG.  ?Do not shave (including legs and underarms) for at least 48 hours prior to first CHG shower. It is OK to shave your face. ? ?Please follow these instructions carefully. ?  ? ? Shower the NIGHT BEFORE SURGERY and the MORNING OF SURGERY with CHG Soap.  ? If you chose to wash your hair, wash your hair first as usual with your normal shampoo. After you shampoo, rinse your hair and body thoroughly to remove the shampoo.  Then [employer] and genitals (private parts) with your normal soap and rinse thoroughly to remove soap. ? ?After that Use CHG Soap as you would any other liquid soap. You can apply CHG directly to the skin and wash gently with a scrungie or a clean washcloth.  ? ?Apply the CHG Soap to your body ONLY FROM THE NECK DOWN.  Do not use on open wounds or open sores. Avoid contact with your eyes, ears, mouth and genitals (private parts). Wash Face and genitals (private parts)  with your normal soap.  ? ?Wash thoroughly, paying special attention to the area where your surgery will be performed. ? ?Thoroughly rinse your body with warm water from the neck down. ? ?DO NOT shower/wash with your normal soap after using and rinsing off the CHG Soap. ? ?Pat yourself dry with a CLEAN TOWEL. ? ?Wear CLEAN PAJAMAS to bed the night before surgery ? ?Place CLEAN SHEETS on your bed the night before your surgery ? ?DO NOT SLEEP WITH PETS. ? ? ?Day of Surgery: ? ?Take a shower with CHG soap. ?Wear Clean/Comfortable clothing the morning of surgery ?Do not apply any deodorants/lotions.   ?Remember to brush your teeth WITH YOUR REGULAR TOOTHPASTE. ? ? ? ?COVID testing ? ?If you are going to stay overnight or be admitted after your procedure/surgery and require a pre-op COVID test, please follow these instructions after your COVID test  ? ?You are not required to quarantine however you are required to wear a well-fitting mask when  you are out and around people not in your household.  If your mask becomes wet or soiled, replace with a new one. ? ?Wash your hands often with soap and water for 20 seconds or clean your hands with an alcohol-based hand sanitizer that contains at least 60% alcohol. ? ?Do not share personal items. ? ?Notify your provider: ?if you are in close contact with someone who has COVID  ?or if you develop a fever of 100.4 or greater, sneezing, cough, sore throat, shortness of breath or body aches. ? ?  ?Please read over the following fact sheets that you were given.   ?

## 2021-08-22 NOTE — Telephone Encounter (Signed)
Received medical records release form,$25.00 cash and disability paperwork from patient. Forwarding to [employer] today  

## 2021-08-23 ENCOUNTER — Other Ambulatory Visit: Payer: Self-pay

## 2021-08-23 ENCOUNTER — Telehealth: Payer: Self-pay | Admitting: Orthopaedic Surgery

## 2021-08-23 ENCOUNTER — Encounter (HOSPITAL_COMMUNITY): Payer: Self-pay

## 2021-08-23 ENCOUNTER — Encounter (HOSPITAL_COMMUNITY)
Admission: RE | Admit: 2021-08-23 | Discharge: 2021-08-23 | Disposition: A | Discharge status: Home / Self Care | Payer: No Typology Code available for payment source | Source: Ambulatory Visit | Attending: Orthopaedic Surgery | Admitting: Orthopaedic Surgery

## 2021-08-23 VITALS — BP 118/76 | HR 87 | Temp 98.5°F | Resp 17 | Ht 62.5 in | Wt 130.0 lb

## 2021-08-23 DIAGNOSIS — Z96651 Presence of right artificial knee joint: Secondary | ICD-10-CM | POA: Diagnosis not present

## 2021-08-23 DIAGNOSIS — Z01818 Encounter for other preprocedural examination: Secondary | ICD-10-CM | POA: Insufficient documentation

## 2021-08-23 DIAGNOSIS — Z20822 Contact with and (suspected) exposure to covid-19: Secondary | ICD-10-CM | POA: Insufficient documentation

## 2021-08-23 HISTORY — DX: Other complications of anesthesia, initial encounter: T88.59XA

## 2021-08-23 HISTORY — DX: Nausea with vomiting, unspecified: R11.2

## 2021-08-23 HISTORY — DX: Other specified postprocedural states: Z98.890

## 2021-08-23 LAB — SURGICAL PCR SCREEN
MRSA, PCR: NEGATIVE
Staphylococcus aureus: NEGATIVE

## 2021-08-23 LAB — COMPREHENSIVE METABOLIC PANEL
ALT: 19 U/L (ref 0–44)
AST: 19 U/L (ref 15–41)
Albumin: 4.6 g/dL (ref 3.5–5.0)
Alkaline Phosphatase: 68 U/L (ref 38–126)
Anion gap: 8 (ref 5–15)
BUN: 18 mg/dL (ref 6–20)
CO2: 26 mmol/L (ref 22–32)
Calcium: 9.3 mg/dL (ref 8.9–10.3)
Chloride: 106 mmol/L (ref 98–111)
Creatinine, Ser: 0.69 mg/dL (ref 0.44–1.00)
GFR, Estimated: >60 mL/min (ref >60)
Glucose, Bld: 108 mg/dL — ABNORMAL HIGH (ref 70–99)
Potassium: 4.3 mmol/L (ref 3.5–5.1)
Sodium: 140 mmol/L (ref 135–145)
Total Bilirubin: 0.5 mg/dL (ref 0.3–1.2)
Total Protein: 7.1 g/dL (ref 6.5–8.1)

## 2021-08-23 LAB — CBC WITH DIFFERENTIAL/PLATELET
Abs Immature Granulocytes: 0.02 10*3/uL (ref 0.00–0.07)
Basophils Absolute: 0 10*3/uL (ref 0.0–0.1)
Basophils Relative: 1 %
Eosinophils Absolute: 0.1 10*3/uL (ref 0.0–0.5)
Eosinophils Relative: 2 %
HCT: 39.3 % (ref 36.0–46.0)
Hemoglobin: 12.7 g/dL (ref 12.0–15.0)
Immature Granulocytes: 0 %
Lymphocytes Relative: 34 %
Lymphs Abs: 2.1 10*3/uL (ref 0.7–4.0)
MCH: 30.4 pg (ref 26.0–34.0)
MCHC: 32.3 g/dL (ref 30.0–36.0)
MCV: 94 fL (ref 80.0–100.0)
Monocytes Absolute: 0.4 10*3/uL (ref 0.1–1.0)
Monocytes Relative: 6 %
Neutro Abs: 3.6 10*3/uL (ref 1.7–7.7)
Neutrophils Relative %: 57 %
Platelets: 208 10*3/uL (ref 150–400)
RBC: 4.18 MIL/uL (ref 3.87–5.11)
RDW: 13 % (ref 11.5–15.5)
WBC: 6.3 10*3/uL (ref 4.0–10.5)
nRBC: 0 % (ref 0.0–0.2)

## 2021-08-23 LAB — SARS CORONAVIRUS 2 (TAT 6-24 HRS): SARS Coronavirus 2: NEGATIVE

## 2021-08-23 NOTE — Progress Notes (Signed)
PCP - Kirby Funk  ? ?Chest x-ray - Not indicated ?EKG - 08/23/21 ?Stress Test - Denies ?ECHO - Denies ?Cardiac Cath - Denies ? ?Sleep Study - Denies ? ?DM Denies ? ?ERAS Protcol - Yes ?PRE-SURGERY Ensure  ? ?COVID TEST- 08/23/21 ? ? ?Anesthesia review: No ? ?Patient denies shortness of breath, fever, cough and chest pain at PAT appointment ? ? ?All instructions explained to the patient, with a verbal understanding of the material. Patient agrees to go over the instructions while at home for a better understanding. Patient also instructed to wear a mask while in public after being tested for COVID-19. The opportunity to ask questions was provided. ? ? ?

## 2021-08-23 NOTE — Telephone Encounter (Signed)
Ok to make referrals  

## 2021-08-23 NOTE — Telephone Encounter (Signed)
Patient is scheduled for right total knee revision this Friday, March 10th.  She would like to do her sessions for physical therapy at [facility].  She is asking if you will send script there so she is able to schedule her visits in advance.  Any questions please call patient [phone] ?

## 2021-08-23 NOTE — Telephone Encounter (Signed)
yes

## 2021-08-24 ENCOUNTER — Other Ambulatory Visit: Payer: Self-pay

## 2021-08-24 DIAGNOSIS — Z96651 Presence of right artificial knee joint: Secondary | ICD-10-CM

## 2021-08-24 NOTE — Telephone Encounter (Signed)
Pt called again and would like the referral to be sent today if possible.  ? ?CB [phone] ?

## 2021-08-24 NOTE — Telephone Encounter (Signed)
Referral made. Patient aware.  

## 2021-08-25 ENCOUNTER — Other Ambulatory Visit: Payer: Self-pay

## 2021-08-25 MED ORDER — TRANEXAMIC ACID 1000 MG/10ML IV SOLN
2000.0000 mg | INTRAVENOUS | Status: DC
  Filled 2021-08-25: qty 20

## 2021-08-26 ENCOUNTER — Inpatient Hospital Stay (HOSPITAL_COMMUNITY): Payer: No Typology Code available for payment source

## 2021-08-26 ENCOUNTER — Encounter (HOSPITAL_COMMUNITY): Payer: Self-pay | Admitting: Orthopaedic Surgery

## 2021-08-26 ENCOUNTER — Other Ambulatory Visit: Payer: Self-pay

## 2021-08-26 ENCOUNTER — Encounter (HOSPITAL_COMMUNITY)
Admission: RE | Disposition: A | Discharge status: Home / Self Care | Payer: Self-pay | Source: Home / Self Care | Attending: Orthopaedic Surgery

## 2021-08-26 ENCOUNTER — Observation Stay (HOSPITAL_COMMUNITY)
Admission: RE | Admit: 2021-08-26 | Discharge: 2021-08-29 | Disposition: A | Discharge status: Home / Self Care | Payer: No Typology Code available for payment source | Attending: Orthopaedic Surgery | Admitting: Orthopaedic Surgery

## 2021-08-26 ENCOUNTER — Ambulatory Visit (HOSPITAL_BASED_OUTPATIENT_CLINIC_OR_DEPARTMENT_OTHER): Payer: No Typology Code available for payment source | Admitting: Anesthesiology

## 2021-08-26 ENCOUNTER — Ambulatory Visit (HOSPITAL_COMMUNITY): Payer: No Typology Code available for payment source | Admitting: Physician Assistant

## 2021-08-26 DIAGNOSIS — Z96651 Presence of right artificial knee joint: Secondary | ICD-10-CM | POA: Diagnosis not present

## 2021-08-26 DIAGNOSIS — M65161 Other infective (teno)synovitis, right knee: Secondary | ICD-10-CM | POA: Diagnosis not present

## 2021-08-26 DIAGNOSIS — T8484XA Pain due to internal orthopedic prosthetic devices, implants and grafts, initial encounter: Secondary | ICD-10-CM

## 2021-08-26 DIAGNOSIS — Z96653 Presence of artificial knee joint, bilateral: Secondary | ICD-10-CM | POA: Insufficient documentation

## 2021-08-26 DIAGNOSIS — T84032A Mechanical loosening of internal right knee prosthetic joint, initial encounter: Secondary | ICD-10-CM | POA: Diagnosis present

## 2021-08-26 DIAGNOSIS — M25361 Other instability, right knee: Secondary | ICD-10-CM | POA: Diagnosis not present

## 2021-08-26 DIAGNOSIS — R52 Pain, unspecified: Secondary | ICD-10-CM

## 2021-08-26 SURGERY — TOTAL KNEE REVISION
Anesthesia: Monitor Anesthesia Care | Site: Knee | Laterality: Right

## 2021-08-26 MED ORDER — ACETAMINOPHEN 500 MG PO TABS
1000.0000 mg | ORAL_TABLET | Freq: Four times a day (QID) | ORAL | Status: AC
  Administered 2021-08-26 – 2021-08-27 (×3): 1000 mg via ORAL
  Filled 2021-08-26 (×3): qty 2

## 2021-08-26 MED ORDER — METHOCARBAMOL 1000 MG/10ML IJ SOLN
500.0000 mg | Freq: Four times a day (QID) | INTRAVENOUS | Status: DC | PRN
  Filled 2021-08-26: qty 5

## 2021-08-26 MED ORDER — DEXAMETHASONE SODIUM PHOSPHATE 10 MG/ML IJ SOLN
INTRAMUSCULAR | Status: DC | PRN
  Administered 2021-08-26: 5 mg via INTRAVENOUS

## 2021-08-26 MED ORDER — ACETAMINOPHEN 160 MG/5ML PO SOLN: 1000.0000 mg | Freq: Once | ORAL | Status: DC | PRN

## 2021-08-26 MED ORDER — ACETAMINOPHEN 325 MG PO TABS
325.0000 mg | ORAL_TABLET | Freq: Four times a day (QID) | ORAL | Status: DC | PRN
  Administered 2021-08-27 – 2021-08-29 (×4): 650 mg via ORAL
  Filled 2021-08-26 (×5): qty 2

## 2021-08-26 MED ORDER — BUPIVACAINE IN DEXTROSE 0.75-8.25 % IT SOLN
INTRATHECAL | Status: DC | PRN
  Administered 2021-08-26: 1.6 mL via INTRATHECAL

## 2021-08-26 MED ORDER — FENTANYL CITRATE (PF) 100 MCG/2ML IJ SOLN
INTRAMUSCULAR | Status: AC
  Filled 2021-08-26: qty 2

## 2021-08-26 MED ORDER — MIDAZOLAM HCL 2 MG/2ML IJ SOLN
INTRAMUSCULAR | Status: AC
  Filled 2021-08-26: qty 2

## 2021-08-26 MED ORDER — ACETAMINOPHEN 500 MG PO TABS: 1000.0000 mg | ORAL_TABLET | Freq: Once | ORAL | Status: DC | PRN

## 2021-08-26 MED ORDER — POLYETHYLENE GLYCOL 3350 17 G PO PACK
17.0000 g | PACK | Freq: Every day | ORAL | Status: DC
  Administered 2021-08-27 – 2021-08-28 (×2): 17 g via ORAL
  Filled 2021-08-26 (×3): qty 1

## 2021-08-26 MED ORDER — ASPIRIN 81 MG PO CHEW
81.0000 mg | CHEWABLE_TABLET | Freq: Two times a day (BID) | ORAL | Status: DC
  Administered 2021-08-26 – 2021-08-29 (×6): 81 mg via ORAL
  Filled 2021-08-26 (×6): qty 1

## 2021-08-26 MED ORDER — FENTANYL CITRATE (PF) 100 MCG/2ML IJ SOLN
25.0000 ug | INTRAMUSCULAR | Status: DC | PRN
  Administered 2021-08-26: 50 ug via INTRAVENOUS

## 2021-08-26 MED ORDER — METOCLOPRAMIDE HCL 5 MG PO TABS: 5.0000 mg | ORAL_TABLET | Freq: Three times a day (TID) | ORAL | Status: DC | PRN

## 2021-08-26 MED ORDER — PANTOPRAZOLE SODIUM 40 MG PO TBEC
40.0000 mg | DELAYED_RELEASE_TABLET | Freq: Every day | ORAL | Status: DC
  Administered 2021-08-26 – 2021-08-29 (×4): 40 mg via ORAL
  Filled 2021-08-26 (×4): qty 1

## 2021-08-26 MED ORDER — ACETAMINOPHEN 10 MG/ML IV SOLN
1000.0000 mg | Freq: Once | INTRAVENOUS | Status: DC | PRN
  Administered 2021-08-26: 1000 mg via INTRAVENOUS

## 2021-08-26 MED ORDER — POVIDONE-IODINE 10 % EX SWAB
2.0000 "application " | Freq: Once | CUTANEOUS | Status: AC
  Administered 2021-08-26: 2 via TOPICAL

## 2021-08-26 MED ORDER — LACTATED RINGERS IV SOLN: INTRAVENOUS | Status: DC

## 2021-08-26 MED ORDER — TRANEXAMIC ACID 1000 MG/10ML IV SOLN
INTRAVENOUS | Status: DC | PRN
  Administered 2021-08-26: 2000 mg via TOPICAL

## 2021-08-26 MED ORDER — OXYCODONE HCL 5 MG PO TABS
5.0000 mg | ORAL_TABLET | ORAL | Status: DC | PRN
  Administered 2021-08-27 (×2): 10 mg via ORAL
  Filled 2021-08-26 (×2): qty 2

## 2021-08-26 MED ORDER — ONDANSETRON HCL 4 MG/2ML IJ SOLN: 4.0000 mg | Freq: Four times a day (QID) | INTRAMUSCULAR | Status: DC | PRN

## 2021-08-26 MED ORDER — OXYCODONE HCL 5 MG PO TABS: 10.0000 mg | ORAL_TABLET | ORAL | Status: DC | PRN

## 2021-08-26 MED ORDER — OXYCODONE HCL ER 10 MG PO T12A
10.0000 mg | EXTENDED_RELEASE_TABLET | Freq: Two times a day (BID) | ORAL | Status: DC
  Administered 2021-08-26 – 2021-08-28 (×5): 10 mg via ORAL
  Filled 2021-08-26 (×5): qty 1

## 2021-08-26 MED ORDER — ORAL CARE MOUTH RINSE: 15.0000 mL | Freq: Once | OROMUCOSAL | Status: AC

## 2021-08-26 MED ORDER — FENTANYL CITRATE (PF) 250 MCG/5ML IJ SOLN
INTRAMUSCULAR | Status: AC
  Filled 2021-08-26: qty 5

## 2021-08-26 MED ORDER — PROPOFOL 10 MG/ML IV BOLUS
INTRAVENOUS | Status: DC | PRN
  Administered 2021-08-26: 50 mg via INTRAVENOUS
  Administered 2021-08-26: 30 mg via INTRAVENOUS

## 2021-08-26 MED ORDER — KETOROLAC TROMETHAMINE 15 MG/ML IJ SOLN
15.0000 mg | Freq: Four times a day (QID) | INTRAMUSCULAR | Status: AC
  Administered 2021-08-26 – 2021-08-27 (×4): 15 mg via INTRAVENOUS
  Filled 2021-08-26 (×4): qty 1

## 2021-08-26 MED ORDER — ONDANSETRON HCL 4 MG PO TABS
4.0000 mg | ORAL_TABLET | Freq: Four times a day (QID) | ORAL | Status: DC | PRN
  Administered 2021-08-27: 4 mg via ORAL
  Filled 2021-08-26: qty 1

## 2021-08-26 MED ORDER — OXYCODONE HCL 5 MG PO TABS: 5.0000 mg | ORAL_TABLET | Freq: Once | ORAL | Status: DC | PRN

## 2021-08-26 MED ORDER — KETOROLAC TROMETHAMINE 30 MG/ML IJ SOLN
INTRAMUSCULAR | Status: DC | PRN
  Administered 2021-08-26: 30 mg via INTRAVENOUS

## 2021-08-26 MED ORDER — CHLORHEXIDINE GLUCONATE 0.12 % MT SOLN
15.0000 mL | Freq: Once | OROMUCOSAL | Status: AC
  Administered 2021-08-26: 15 mL via OROMUCOSAL
  Filled 2021-08-26: qty 15

## 2021-08-26 MED ORDER — VANCOMYCIN HCL 1000 MG IV SOLR
INTRAVENOUS | Status: AC
  Filled 2021-08-26: qty 20

## 2021-08-26 MED ORDER — ONDANSETRON HCL 4 MG/2ML IJ SOLN
INTRAMUSCULAR | Status: DC | PRN
  Administered 2021-08-26: 4 mg via INTRAVENOUS

## 2021-08-26 MED ORDER — KETOROLAC TROMETHAMINE 30 MG/ML IJ SOLN
INTRAMUSCULAR | Status: AC
  Filled 2021-08-26: qty 1

## 2021-08-26 MED ORDER — FENTANYL CITRATE (PF) 250 MCG/5ML IJ SOLN
INTRAMUSCULAR | Status: DC | PRN
  Administered 2021-08-26 (×2): 50 ug via INTRAVENOUS

## 2021-08-26 MED ORDER — ACETAMINOPHEN 10 MG/ML IV SOLN
INTRAVENOUS | Status: AC
Start: 2021-08-26 — End: 2021-08-27
  Filled 2021-08-26: qty 100

## 2021-08-26 MED ORDER — PROPOFOL 10 MG/ML IV BOLUS
INTRAVENOUS | Status: AC
  Filled 2021-08-26: qty 20

## 2021-08-26 MED ORDER — METOCLOPRAMIDE HCL 5 MG/ML IJ SOLN
5.0000 mg | Freq: Three times a day (TID) | INTRAMUSCULAR | Status: DC | PRN
  Administered 2021-08-26: 10 mg via INTRAVENOUS
  Filled 2021-08-26: qty 2

## 2021-08-26 MED ORDER — FENTANYL CITRATE (PF) 100 MCG/2ML IJ SOLN
INTRAMUSCULAR | Status: AC
  Administered 2021-08-26: 50 ug via INTRAVENOUS
  Filled 2021-08-26: qty 2

## 2021-08-26 MED ORDER — DEXAMETHASONE SODIUM PHOSPHATE 10 MG/ML IJ SOLN
10.0000 mg | Freq: Once | INTRAMUSCULAR | Status: AC
  Administered 2021-08-27: 10 mg via INTRAVENOUS
  Filled 2021-08-26: qty 1

## 2021-08-26 MED ORDER — ALUM & MAG HYDROXIDE-SIMETH 200-200-20 MG/5ML PO SUSP: 30.0000 mL | ORAL | Status: DC | PRN

## 2021-08-26 MED ORDER — PHENYLEPHRINE HCL-NACL 20-0.9 MG/250ML-% IV SOLN
INTRAVENOUS | Status: DC | PRN
  Administered 2021-08-26: 40 ug/min via INTRAVENOUS

## 2021-08-26 MED ORDER — TRANEXAMIC ACID-NACL 1000-0.7 MG/100ML-% IV SOLN
1000.0000 mg | Freq: Once | INTRAVENOUS | Status: AC
  Administered 2021-08-26: 1000 mg via INTRAVENOUS
  Filled 2021-08-26: qty 100

## 2021-08-26 MED ORDER — OXYCODONE HCL 5 MG/5ML PO SOLN: 5.0000 mg | Freq: Once | ORAL | Status: DC | PRN

## 2021-08-26 MED ORDER — METHOCARBAMOL 500 MG PO TABS
500.0000 mg | ORAL_TABLET | Freq: Four times a day (QID) | ORAL | Status: DC | PRN
  Administered 2021-08-26 – 2021-08-29 (×6): 500 mg via ORAL
  Filled 2021-08-26 (×6): qty 1

## 2021-08-26 MED ORDER — CEFAZOLIN SODIUM-DEXTROSE 2-4 GM/100ML-% IV SOLN
2.0000 g | Freq: Four times a day (QID) | INTRAVENOUS | Status: AC
  Administered 2021-08-26 – 2021-08-27 (×2): 2 g via INTRAVENOUS
  Filled 2021-08-26 (×2): qty 100

## 2021-08-26 MED ORDER — MIDAZOLAM HCL 2 MG/2ML IJ SOLN
INTRAMUSCULAR | Status: DC | PRN
Start: 2021-08-26 — End: 2021-08-26
  Administered 2021-08-26: 2 mg via INTRAVENOUS

## 2021-08-26 MED ORDER — DOCUSATE SODIUM 100 MG PO CAPS
100.0000 mg | ORAL_CAPSULE | Freq: Two times a day (BID) | ORAL | Status: DC
  Administered 2021-08-26 – 2021-08-28 (×5): 100 mg via ORAL
  Filled 2021-08-26 (×6): qty 1

## 2021-08-26 MED ORDER — CEFAZOLIN SODIUM-DEXTROSE 2-4 GM/100ML-% IV SOLN
2.0000 g | INTRAVENOUS | Status: AC
  Administered 2021-08-26: 2 g via INTRAVENOUS
  Filled 2021-08-26: qty 100

## 2021-08-26 MED ORDER — MENTHOL 3 MG MT LOZG: 1.0000 | LOZENGE | OROMUCOSAL | Status: DC | PRN

## 2021-08-26 MED ORDER — SORBITOL 70 % SOLN
30.0000 mL | Freq: Every day | Status: DC | PRN
  Filled 2021-08-26: qty 30

## 2021-08-26 MED ORDER — PRONTOSAN WOUND IRRIGATION OPTIME
TOPICAL | Status: DC | PRN
  Administered 2021-08-26: 2 via TOPICAL

## 2021-08-26 MED ORDER — SODIUM CHLORIDE 0.9 % IV SOLN: INTRAVENOUS | Status: DC

## 2021-08-26 MED ORDER — BUPIVACAINE-MELOXICAM ER 400-12 MG/14ML IJ SOLN
INTRAMUSCULAR | Status: AC
  Filled 2021-08-26: qty 1

## 2021-08-26 MED ORDER — PHENOL 1.4 % MT LIQD: 1.0000 | OROMUCOSAL | Status: DC | PRN

## 2021-08-26 MED ORDER — TRANEXAMIC ACID-NACL 1000-0.7 MG/100ML-% IV SOLN
1000.0000 mg | INTRAVENOUS | Status: AC
  Administered 2021-08-26: 1000 mg via INTRAVENOUS
  Filled 2021-08-26: qty 100

## 2021-08-26 MED ORDER — HYDROMORPHONE HCL 1 MG/ML IJ SOLN
0.5000 mg | INTRAMUSCULAR | Status: DC | PRN
  Administered 2021-08-26 – 2021-08-29 (×5): 1 mg via INTRAVENOUS
  Filled 2021-08-26 (×5): qty 1

## 2021-08-26 MED ORDER — HYDROXYZINE HCL 50 MG/ML IM SOLN
50.0000 mg | Freq: Four times a day (QID) | INTRAMUSCULAR | Status: DC | PRN
  Administered 2021-08-26: 50 mg via INTRAMUSCULAR
  Filled 2021-08-26 (×2): qty 1

## 2021-08-26 MED ORDER — BUPIVACAINE-EPINEPHRINE (PF) 0.5% -1:200000 IJ SOLN
INTRAMUSCULAR | Status: DC | PRN
  Administered 2021-08-26: 20 mL via PERINEURAL

## 2021-08-26 MED ORDER — METOPROLOL TARTRATE 25 MG PO TABS
25.0000 mg | ORAL_TABLET | Freq: Every day | ORAL | Status: DC
  Administered 2021-08-26 – 2021-08-29 (×4): 25 mg via ORAL
  Filled 2021-08-26 (×4): qty 1

## 2021-08-26 MED ORDER — BUPIVACAINE-MELOXICAM ER 400-12 MG/14ML IJ SOLN
INTRAMUSCULAR | Status: DC | PRN
  Administered 2021-08-26: 14 mL

## 2021-08-26 MED ORDER — SODIUM CHLORIDE 0.9 % IR SOLN
Status: DC | PRN
  Administered 2021-08-26: 3000 mL
  Administered 2021-08-26: 1000 mL

## 2021-08-26 MED ORDER — PROPOFOL 500 MG/50ML IV EMUL
INTRAVENOUS | Status: DC | PRN
  Administered 2021-08-26: 75 ug/kg/min via INTRAVENOUS

## 2021-08-26 SURGICAL SUPPLY — 66 items
ATTUNE PSRP INSR SZ6 10 KNEE (Insert) ×1 IMPLANT
AUGMENT DISTAL FEM SZ6 8 KNEE (Miscellaneous) ×2 IMPLANT
AUGMENT POST FEM SZ6 4 KNEE (Miscellaneous) ×2 IMPLANT
BAG COUNTER SPONGE SURGICOUNT (BAG) ×2 IMPLANT
BLADE CLIPPER SURG (BLADE) IMPLANT
BLADE SAG 18X100X1.27 (BLADE) ×2 IMPLANT
BLADE SAGITTAL 25.0X1.27X90 (BLADE) ×2 IMPLANT
BOWL SMART MIX CTS (DISPOSABLE) IMPLANT
CEMENT BONE REFOBACIN R1X40 US (Cement) ×1 IMPLANT
COVER SURGICAL LIGHT HANDLE (MISCELLANEOUS) ×2 IMPLANT
CUFF TOURN SGL QUICK 34 (TOURNIQUET CUFF)
CUFF TOURN SGL QUICK 42 (TOURNIQUET CUFF) IMPLANT
CUFF TRNQT CYL 34X4.125X (TOURNIQUET CUFF) IMPLANT
DERMABOND ADVANCED (GAUZE/BANDAGES/DRESSINGS) ×1
DERMABOND ADVANCED .7 DNX12 (GAUZE/BANDAGES/DRESSINGS) IMPLANT
DRAPE ORTHO SPLIT 77X108 STRL (DRAPES) ×4
DRAPE POUCH INSTRU U-SHP 10X18 (DRAPES) ×2 IMPLANT
DRAPE SURG ORHT 6 SPLT 77X108 (DRAPES) ×2 IMPLANT
DRAPE U-SHAPE 47X51 STRL (DRAPES) ×2 IMPLANT
DRSG AQUACEL AG ADV 3.5X10 (GAUZE/BANDAGES/DRESSINGS) ×1 IMPLANT
DURAPREP 26ML APPLICATOR (WOUND CARE) ×4 IMPLANT
ELECT CAUTERY BLADE 6.4 (BLADE) ×2 IMPLANT
ELECT REM PT RETURN 9FT ADLT (ELECTROSURGICAL) ×2
ELECTRODE REM PT RTRN 9FT ADLT (ELECTROSURGICAL) ×1 IMPLANT
FEM KNEE ATTUNE REV CRS SZ6 RT (Orthopedic Implant) ×2 IMPLANT
FEMORAL KNE ATTN REV CRS SZ6RT (Orthopedic Implant) IMPLANT
GLOVE SURG LTX SZ7 (GLOVE) ×2 IMPLANT
GLOVE SURG UNDER POLY LF SZ7 (GLOVE) ×40 IMPLANT
GLOVE SURG UNDER POLY LF SZ7.5 (GLOVE) ×8 IMPLANT
GOWN STRL REIN XL XLG (GOWN DISPOSABLE) ×4 IMPLANT
HANDPIECE INTERPULSE COAX TIP (DISPOSABLE)
HOOD PEEL AWAY FLYTE STAYCOOL (MISCELLANEOUS) ×4 IMPLANT
JET LAVAGE IRRISEPT WOUND (IRRIGATION / IRRIGATOR) ×2
KIT BASIN OR (CUSTOM PROCEDURE TRAY) ×2 IMPLANT
KIT TURNOVER KIT B (KITS) ×2 IMPLANT
LAVAGE JET IRRISEPT WOUND (IRRIGATION / IRRIGATOR) ×1 IMPLANT
MANIFOLD NEPTUNE II (INSTRUMENTS) ×2 IMPLANT
MARKER SKIN DUAL TIP RULER LAB (MISCELLANEOUS) ×2 IMPLANT
NDL SPNL 18GX3.5 QUINCKE PK (NEEDLE) ×1 IMPLANT
NEEDLE SPNL 18GX3.5 QUINCKE PK (NEEDLE) ×2 IMPLANT
NS IRRIG 1000ML POUR BTL (IV SOLUTION) ×2 IMPLANT
PACK TOTAL JOINT (CUSTOM PROCEDURE TRAY) ×2 IMPLANT
PAD ARMBOARD 7.5X6 YLW CONV (MISCELLANEOUS) ×4 IMPLANT
SAW OSC TIP CART 19.5X105X1.3 (SAW) ×2 IMPLANT
SET HNDPC FAN SPRY TIP SCT (DISPOSABLE) IMPLANT
SLEEVE FEM POROCOAT 30 (Knees) ×1 IMPLANT
SPONGE T-LAP 18X18 ~~LOC~~+RFID (SPONGE) ×3 IMPLANT
STEM STR ATTUNE PF 12X160 (Knees) ×1 IMPLANT
SUCTION FRAZIER HANDLE 10FR (MISCELLANEOUS) ×2
SUCTION TUBE FRAZIER 10FR DISP (MISCELLANEOUS) ×1 IMPLANT
SUT ETHILON 2 0 PSLX (SUTURE) ×4 IMPLANT
SUT MNCRL AB 3-0 PS2 27 (SUTURE) ×2 IMPLANT
SUT VIC AB 0 CT1 27 (SUTURE) ×4
SUT VIC AB 0 CT1 27XBRD ANBCTR (SUTURE) ×2 IMPLANT
SUT VIC AB 1 CTX 36 (SUTURE) ×6
SUT VIC AB 1 CTX36XBRD ANBCTR (SUTURE) ×2 IMPLANT
SUT VIC AB 1 CTX36XBRD ANBCTRL (SUTURE) IMPLANT
SUT VIC AB 2-0 CT1 (SUTURE) ×2 IMPLANT
SUT VIC AB 2-0 CT1 27 (SUTURE) ×4
SUT VIC AB 2-0 CT1 TAPERPNT 27 (SUTURE) ×2 IMPLANT
SYR 20ML LL LF (SYRINGE) ×2 IMPLANT
SYR 50ML SLIP (SYRINGE) ×2 IMPLANT
TOWEL GREEN STERILE (TOWEL DISPOSABLE) ×2 IMPLANT
TOWEL GREEN STERILE FF (TOWEL DISPOSABLE) ×2 IMPLANT
TRAY FOLEY MTR SLVR 16FR STAT (SET/KITS/TRAYS/PACK) IMPLANT
YANKAUER SUCT BULB TIP NO VENT (SUCTIONS) ×2 IMPLANT

## 2021-08-26 NOTE — Anesthesia Procedure Notes (Signed)
Procedure Name: [place] ?Date/Time: 08/26/2021 12:16 PM ?Performed by: Kyung Rudd, CRNA ?Pre-anesthesia Checklist: Patient identified, Emergency Drugs available, Suction available and Patient being monitored ?Patient Re-evaluated:Patient Re-evaluated prior to induction ?Oxygen Delivery Method: Simple face mask ?Induction Type: IV induction ?Placement Confirmation: positive ETCO2 ?Dental Injury: Teeth and Oropharynx as per pre-operative assessment  ? ? ? ? ?

## 2021-08-26 NOTE — Op Note (Addendum)
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 Glee Arvin, MD  Assist: Oneal Grout, PA-C; necessary for the timely completion of procedure and due to complexity of procedure.  Anesthesia: Spinal, regional, local  Tourniquet time: see anesthesia record  Implants used: DePuy Femur: Attune revision femur size 6, 8 mm distal augments, 4 mm posterior augments, 30 mm fully porous sleeve, 12 x 60 mm press-fit stem Polyethylene: 10 mm rotating platform  Indication: Samantha Curtis is a 56 y.o. year old female with a history of right knee pain status post bilateral total knee replacement in 2019 by one of my colleagues in town.  Her left knee did extremely well and had no problems but unfortunately the right knee replacement developed chronic pain, effusion and instability.  She had undergone arthroscopic synovectomy with temporary relief.  Infection work-up was negative multiple times.  A bone scan was suspicious for loosening around the lateral femoral condyle which corresponded to the location of her pain.  Therefore given failure of conservative management the patient elected to undergo surgical treatment.   Procedure: After informed consent was obtained and understanding of the risk were voiced including but not limited to bleeding, infection, damage to surrounding structures including nerves and vessels, blood clots, leg length inequality and the failure to achieve desired results, the operative extremity was marked with verbal confirmation of the patient in the holding  area.   The patient was then brought to the operating room and transported to the operating room table in the supine position.  I first performed examination of the knee joint under anesthesia.  She had approximately 5 to 6 mm of valgus and varus thrust.  With the knee at 90 degrees she had 3 to 4 mm of anterior translation of the proximal tibia.  Joint effusion was present.  A tourniquet was applied to the operative extremity around the upper thigh. The operative limb was then prepped and draped in the usual sterile fashion and antibiotics were held and then later administered once cultures and frozens were taken.  A time out was performed prior to the start of surgery confirming the correct extremity, preoperative antibiotic administration, as well as team members, implants and instruments available for the case. Correct surgical site was also confirmed with preoperative radiographs. The limb was then elevated for exsanguination and the tourniquet was inflated.  The previous surgical scar was excised in a full-thickness manner with a 15 blade.  Dissection was taken sharply down through the subcutaneous tissue.  Full-thickness flaps were raised medially and laterally.  A standard medial parapatellar approach was performed and there was a large effusion which was cultured.  Soft tissues were cleared around the knee and synovectomy was performed to improve exposure.  There was extensive synovectomy especially around the lateral gutter corresponding to where her pain was.  Once I was happy with the synovectomy the knee was then flexed up carefully and retractors were placed.  The patellar tendon insertion was protected.  An osteotome was used to remove the polyethylene liner.  Synovectomy was performed in the back of the knee.  We then carefully inspected both components and unfortunately I found that there was  resorption of the lateral femoral condyle and there was gross loosening of the component around the  lateral side of the femur.  The medial side of the femur was well bonded and did not demonstrate any loosening.  The tibial component was also unremarkable.  The position of the components were appropriate.  The patellar component was unremarkable.  We then used an oscillating ACL saw to break up the interface between the cement and the implant.  An osteotome was used to complete the extraction which was done without any bone loss.  After the component was removed I saw that the cement was well bonded to the medial femoral condyle but there was no bonding of the cement to the lateral femoral condyle.  The cement was bonded to the backside of the implant.  The lateral femoral condyle had been replaced by fibrous tissue.  This tissue was cultured and sent for stat frozens which did not show any inflammation or evidence of infection.   We then continued with the femoral component revision.  The knee joint was first thoroughly irrigated with 3 L of normal saline pulse lavage.  The distal femoral bony surface was freshened with an oscillating blade.  Sequential reaming was performed up to a 12 mm press-fit stem with excellent circumferential fit.  I felt that she had flexion instability therefore I increased her femoral component size to a 6.   I did feel that she needed 8 mm distal augment on both the medial and lateral side and 4 mm posterior augment on both the medial and lateral sides.  The distal femoral bone was prepared and a trial femur was placed.  Rotation was marked.  We are able to balance her gaps using a 10 mm spacer.  Trial components were then removed. The bony surfaces were irrigated with a pulse lavage and then dried. Bone cement was vacuum mixed on the back table, and the final component sized above was cemented into place.  Antibiotic irrigation was placed in the knee joint and soft tissues while the cement cured.  After cement had finished curing, excess cement was removed. The stability of the  construct was re-evaluated throughout a range of motion and found to be acceptable. The trial liner was removed, the knee was copiously irrigated, and the knee was re-evaluated for any excess bone debris. The real polyethylene liner, 10 mm thick, was inserted. The tourniquet was deflated and hemostasis was achieved. The wound was irrigated with normal saline.  One gram of vancomycin powder was placed in the surgical bed.  Topical 0.25% bupivacaine and meloxicam was placed in the joint for postoperative pain.  2 g of topical tranexamic acid was injected into the knee joint after it was closed.  Capsular closure was performed with a #1 vicryl, subcutaneous fat closed with a 0 vicryl suture, then subcutaneous tissue closed with interrupted 2.0 vicryl suture. The skin was then closed with a 2-0 nylon and Dermabond. A sterile dressing was applied.  The patient was awakened in the operating room and taken to recovery in stable condition. All sponge, needle, and instrument counts were correct at the end of the case.  Tessa Lerner was necessary for opening, closing, retracting, limb positioning and overall facilitation and completion of the surgery.  Position: supine  Complications: none.  Time Out: performed   Drains/Packing: none  Estimated blood loss: minimal  Returned to Recovery Room: in good condition.   Antibiotics: yes   Mechanical VTE (DVT) Prophylaxis: sequential compression devices,  TED thigh-high  Chemical VTE (DVT) Prophylaxis: Aspirin 81 mg twice daily  Fluid Replacement  Crystalloid: see anesthesia record Blood: none  FFP: none   Specimens Removed: 1 to pathology   Sponge and Instrument Count Correct? yes   PACU: portable radiograph - knee AP and Lateral   Plan/RTC: Return in 2 weeks for wound check.   Weight Bearing/Load Lower Extremity: full   Implant Name Type Inv. Item Serial No. Manufacturer Lot No. LRB No. Used Action  SLEEVE FEM POROCOAT 30 - [id number] Knees  SLEEVE FEM POROCOAT 30  [employer]PU[employer] [id number] Right 1 Implanted  STEM STR ATTUNE PF 12X160 - [id number] Knees STEM STR ATTUNE PF 12X160  DEPUY ORTH[employer] [id number] Right 1 Implanted  AUGMENT POST FEM SZ6 4 KNEE - [id number] Miscellaneous AUGMENT POST FEM SZ6 4 KNEE  DEPUY ORTHOPAEDICS [id number] Right 1 Implanted  CEMENT BONE REFOBACIN R1X40 US - [id number] Cement CEMENT BONE REFOBACIN R1X40 US  ZIMMER RECON(ORTH,TRAU,BIO,SG) [id number] Right 1 Implanted  AUGMENT DISTAL FEM SZ6 8 KNEE - [id number] Miscellaneous AUGMENT DISTAL FEM SZ6 8 KNEE  [employer]PU[employer] [id number] Right 1 Implanted  AUGMENT POST FEM SZ6 4 KNEE - [id number] Miscellaneous AUGMENT POST FEM SZ6 4 KNEE  DEPUY ORTH[employer]AEDICS [id number] Right 1 Implanted  FEM KNEE ATTUNE REV CRS SZ6 RT - [id number] Orthopedic Implant FEM KNEE ATTUNE REV CRS SZ6 RT  DEPUY ORTHOPAEDICS [id number] Right 1 Implanted  ATTUNE PSRP INSR SZ6 10MM KNEE - [id number] Insert ATTUNE PSRP INSR SZ6 10MM KNEE  DEPU[employer] OR[employer]ICS [id number] Right 1 Implanted  AUGMENT DISTAL FEM SZ6 8 KNEE - [id number] Miscellaneous AUGMENT DISTAL FEM SZ6 8 KNEE  DEPUY ORTH[employer]ICS [id number] Right 1 Implanted    N. Glee ArvinMichael Floria Brandau, MD [facility] 3:15 PM

## 2021-08-26 NOTE — Anesthesia Procedure Notes (Addendum)
Spinal ? ?Patient location during procedure: OR ?Start time: 08/26/2021 12:06 PM ?End time: 08/26/2021 12:11 PM ?Reason for block: surgical anesthesia ?Staffing ?Performed: anesthesiologist  ?Anesthesiologist: Val Eagle, MD ?Preanesthetic Checklist ?Completed: patient identified, IV checked, risks and benefits discussed, surgical consent, monitors and equipment checked, pre-op evaluation and timeout performed ?Spinal Block ?Patient position: sitting ?Prep: DuraPrep ?Patient monitoring: heart rate, cardiac monitor, continuous pulse ox and blood pressure ?Approach: midline ?Location: L4-5 ?Injection technique: single-shot ?Needle ?Needle type: Pencan  ?Needle gauge: 24 G ?Needle length: 9 cm ?Assessment ?Sensory level: T6 ?Events: CSF return ? ? ? ?

## 2021-08-26 NOTE — H&P (Signed)
? ? ?PREOPERATIVE H&P ? ?Chief Complaint: painful right total knee arthroplasty ? ?HPI: ?Samantha Curtis is a 56 y.o. female who presents for surgical treatment of painful right total knee arthroplasty.  She denies any changes in medical history. ? ?Past Medical History:  ?Diagnosis Date  ? Arthritis   ? "knees" (07/04/2017)  ? Complication of anesthesia   ? Dysrhythmia   ? PALPITATIONS  ? GERD (gastroesophageal reflux disease)   ? High cholesterol   ? Leg pain   ? PONV (postoperative nausea and vomiting)   ? ?Past Surgical History:  ?Procedure Laterality Date  ? ENDOVENOUS ABLATION SAPHENOUS VEIN W/ LASER Left 2005  ? [facility]  ? JOINT REPLACEMENT    ? KNEE ARTHROSCOPY Right   ? SHOULDER ARTHROSCOPY Right   ? "spurs"  ? SHOULDER ARTHROSCOPY Right 06/24/2019  ? Procedure: RIGHT SHOULDER ARTHROSCOPY; CHONDROPLASTY AND DEBRIEDMENT;  Surgeon: Melrose Nakayama, MD;  Location: [facility];  Service: Orthopedics;  Laterality: Right;  ? TOTAL KNEE ARTHROPLASTY Bilateral 07/03/2017  ? Procedure: TOTAL KNEE BILATERAL;  Surgeon: Melrose Nakayama, MD;  Location: [facility];  Service: Orthopedics;  Laterality: Bilateral;  ? TUBAL LIGATION    ? VAGINAL HYSTERECTOMY    ? ?Social History  ? ?Socioeconomic History  ? Marital status: Married  ?  Spouse name: Imad  ? Number of children: 2  ? Years of education: Not on file  ? Highest education level: Not on file  ?Occupational History  ? Not on file  ?Tobacco Use  ? Smoking status: Never  ? Smokeless tobacco: Never  ?Vaping Use  ? Vaping Use: Never used  ?Substance and Sexual Activity  ? Alcohol use: No  ? Drug use: No  ? Sexual activity: Yes  ?Other Topics Concern  ? Not on file  ?Social History Narrative  ? Not on file  ? ?Social Determinants of Health  ? ?Financial Resource Strain: Not on file  ?Food Insecurity: Not on file  ?Transportation Needs: Not on file  ?Physical Activity: Not on file  ?Stress: Not on file  ?Social Connections: Not on file  ? ?Family History  ?Problem Relation Age  of Onset  ? Dementia Mother   ? ?Allergies  ?Allergen Reactions  ? Cyclobenzaprine Hives and Rash  ? Latex Other (See Comments)  ?  Sores in mouth from dentist  ? Other Hives  ?  ALL MUSCLE RELAXERS  per pt ?Except Robaxin  ? Tizanidine Hcl Hives  ? Levofloxacin Nausea Only  ? ?Prior to Admission medications   ?Medication Sig Start Date End Date Taking? Authorizing Provider  ?acetaminophen (TYLENOL) 500 MG tablet Take 1,000 mg by mouth every 6 (six) hours as needed for moderate pain.   Yes [provider]  ?atorvastatin (LIPITOR) 10 MG tablet Take 1 tablet (10 mg total) by mouth daily. 02/23/21  Yes   ?cetirizine (ZYRTEC) 10 MG tablet Take 10 mg by mouth daily.   Yes [provider]  ?diclofenac Sodium (VOLTAREN) 1 % GEL Apply 2 g topically daily as needed (pain).   Yes [provider]  ?famotidine (PEPCID) 20 MG tablet Take 20 mg by mouth daily.   Yes [provider]  ?finasteride (PROPECIA) 1 MG tablet Take 2 tablets (2 mg total) by mouth daily. 06/06/21  Yes   ?metoprolol tartrate (LOPRESSOR) 25 MG tablet Take 1 tablet (25 mg total) by mouth daily with food. 02/14/21  Yes   ?naproxen (NAPROSYN) 500 MG tablet Take 1 tablet (500 mg total) by  mouth 2 (two) times daily with food as needed for pain 11/11/20 11/11/21 Yes Loni Dolly, PA-C  ?aspirin EC 81 MG tablet Take 1 tablet (81 mg total) by mouth 2 (two) times daily. To be taken after surgery to prevent blood clots 08/22/21   Aundra Dubin, PA-C  ?atorvastatin (LIPITOR) 10 MG tablet TAKE 1 TABLET BY MOUTH ONCE DAILY ?Patient not taking: Reported on 08/18/2021 03/05/20 03/05/21  Lavone Orn, MD  ?COVID-19 At Home Antigen Test [facility] COVID-19 HOME TEST) KIT Use as directed 07/18/21   Jefm Bryant, [facility]  ?COVID-19 At Home Antigen Test (QUICKVUE AT-HOME COVID-19 TEST) KIT Use as directed. 02/24/21   Jefm Bryant, RPH  ?COVID-19 At Home Antigen Test KIT USE AS DIRECTED WITHIN PACKAGE INSTRUCTIONS. 08/26/20 08/26/21  Jefm Bryant,  RPH  ?docusate sodium (COLACE) 100 MG capsule Take 1 capsule (100 mg total) by mouth daily as needed. 08/22/21 08/22/22  Aundra Dubin, PA-C  ?gentamicin cream (GARAMYCIN) 0.1 % Apply 1 application topically 2 (two) times daily. ?Patient not taking: Reported on 08/18/2021 04/27/21   Edrick Kins, DPM  ?methocarbamol (ROBAXIN) 500 MG tablet Take 1 tablet (500 mg total) by mouth 2 (two) times daily as needed. To be taken after surgery 08/22/21   Aundra Dubin, PA-C  ?ondansetron (ZOFRAN) 4 MG tablet Take 1 tablet (4 mg total) by mouth every 8 (eight) hours as needed for nausea or vomiting. 08/22/21   Aundra Dubin, PA-C  ?oxyCODONE-acetaminophen (PERCOCET) 5-325 MG tablet Take 1-2 tablets by mouth every 6 (six) hours as needed. To be taken after surgery 08/22/21   Aundra Dubin, PA-C  ?pantoprazole (PROTONIX) 40 MG tablet TAKE 1 TABLET BY MOUTH 30 MINUTES PRIOR TO BREAKFAST & BEFORE DINNER ?Patient not taking: Reported on 08/18/2021 09/08/20 09/08/21  Lavone Orn, MD  ?pantoprazole (PROTONIX) 40 MG tablet Take 1 tablet (40 mg total) by mouth 15-30 minutes before breakfast and before dinner ?Patient not taking: Reported on 08/18/2021 03/04/21     ?Sod Fluoride-Potassium Nitrate (PREVIDENT 5000 ENAMEL PROTECT) 1.1-5 % GEL Take as directed on product packaging ?Patient not taking: Reported on 08/18/2021 04/12/21     ?sulfamethoxazole-trimethoprim (BACTRIM DS) 800-160 MG tablet Take 1 tablet by mouth 2 (two) times daily for 5 days ?Patient not taking: Reported on 08/18/2021 05/04/21     ? ? ? ?Positive ROS: All other systems have been reviewed and were otherwise negative with the exception of those mentioned in the HPI and as above. ? ?Physical Exam: ?General: Alert, no acute distress ?Cardiovascular: No pedal edema ?Respiratory: No cyanosis, no use of accessory musculature ?GI: abdomen soft ?Skin: No lesions in the area of chief complaint ?Neurologic: Sensation intact distally ?Psychiatric: Patient is competent for consent with  normal mood and affect ?Lymphatic: no lymphedema ? ?MUSCULOSKELETAL: exam stable ? ?Assessment: ?painful right total knee arthroplasty ? ?Plan: ?Plan for Procedure(s): ?RIGHT TOTAL KNEE REVISION ? ?The risks benefits and alternatives were discussed with the patient including but not limited to the risks of nonoperative treatment, versus surgical intervention including infection, bleeding, nerve injury,  blood clots, cardiopulmonary complications, morbidity, mortality, among others, and they were willing to proceed.  ? ?Preoperative templating of the joint replacement has been completed, documented, and submitted to the Operating Room personnel in order to optimize intra-operative equipment management. ? ? ?Eduard Roux, MD ?08/26/2021 ?10:03 AM ? ?

## 2021-08-26 NOTE — Anesthesia Procedure Notes (Signed)
Anesthesia Regional Block: Adductor canal block  ? ?Pre-Anesthetic Checklist: , timeout performed,  Correct Patient, Correct Site, Correct Laterality,  Correct Procedure, Correct Position, site marked,  Risks and benefits discussed,  Surgical consent,  Pre-op evaluation,  At surgeon's request and post-op pain management ? ?Laterality: Right and Lower ? ?Prep: chloraprep     ?  ?Needles:  ?Injection technique: Single-shot ? ?  ? ? ?Needle Length: 9cm  ?Needle Gauge: 22  ? ? ? ?Additional Needles: ?Arrow? StimuQuik? ECHO Echogenic Stimulating PNB Needle ? ?Procedures:,,,, ultrasound used (permanent image in chart),,    ?Narrative:  ?Start time: 08/26/2021 1:31 PM ?End time: 08/26/2021 1:35 PM ?Injection made incrementally with aspirations every 5 mL. ? ?Performed by: Personally  ?Anesthesiologist: Val Eagle, MD ? ? ? ? ?

## 2021-08-26 NOTE — Transfer of Care (Signed)
Immediate Anesthesia Transfer of Care Note ? ?Patient: Samantha Curtis ? ?Procedure(s) Performed: RIGHT TOTAL KNEE REVISION (Right: Knee) ? ?Patient Location: PACU ? ?Anesthesia Type:General ? ?Level of Consciousness: awake, alert  and oriented ? ?Airway & Oxygen Therapy: Patient Spontanous Breathing and Patient connected to face mask oxygen ? ?Post-op Assessment: Report given to RN and Post -op Vital signs reviewed and stable ? ?Post vital signs: Reviewed and stable ? ?Last Vitals:  ?Vitals Value Taken Time  ?BP 108/64 08/26/21 1552  ?Temp    ?Pulse 104 08/26/21 1554  ?Resp 22 08/26/21 1554  ?SpO2 99 % 08/26/21 1554  ?Vitals shown include unvalidated device data. ? ?Last Pain:  ?Vitals:  ? 08/26/21 1018  ?TempSrc:   ?PainSc: 4   ?   ? ?  ? ?Complications: No notable events documented. ?

## 2021-08-26 NOTE — Anesthesia Preprocedure Evaluation (Signed)
Anesthesia Evaluation  ?Patient identified by MRN, date of birth, ID band ?Patient awake ? ? ? ?Reviewed: ?Allergy & Precautions, NPO status , Patient's Chart, lab work & pertinent test results ? ?History of Anesthesia Complications ?(+) PONV and history of anesthetic complications ? ?Airway ?Mallampati: II ? ?TM Distance: >3 FB ?Neck ROM: Full ? ? ? Dental ? ?(+) Dental Advisory Given, Teeth Intact ?  ?Pulmonary ?neg shortness of breath, neg sleep apnea, neg COPD, neg recent URI,  ?  ?breath sounds clear to auscultation ? ? ? ? ? ? Cardiovascular ?(-) hypertension(-) angina(-) Past MI and (-) CHF  ?Rhythm:Regular  ?PALPITATIONS ?  ?Neuro/Psych ?negative neurological ROS ?   ? GI/Hepatic ?Neg liver ROS, GERD  Medicated and Controlled,  ?Endo/Other  ?negative endocrine ROS ? Renal/GU ?negative Renal ROS  ? ?  ?Musculoskeletal ? ?(+) Arthritis , painful right total knee arthroplasty  ? Abdominal ?  ?Peds ? Hematology ?negative hematology ROS ?(+) Lab Results ?     Component                Value               Date                 ?     WBC                      6.3                 08/23/2021           ?     HGB                      12.7                08/23/2021           ?     HCT                      39.3                08/23/2021           ?     MCV                      94.0                08/23/2021           ?     PLT                      208                 08/23/2021           ? ?Denies blood thinners   ?Anesthesia Other Findings ? ? Reproductive/Obstetrics ? ?  ? ? ? ? ? ? ? ? ? ? ? ? ? ?  ?  ? ? ? ? ? ? ? ? ?Anesthesia Physical ?Anesthesia Plan ? ?ASA: 2 ? ?Anesthesia Plan: MAC, Regional and Spinal  ? ?Post-op Pain Management: Regional block* and Toradol IV (intra-op)*  ? ?Induction:  ? ?PONV Risk Score and Plan: 3 and Propofol infusion ? ?Airway Management Planned: Nasal Cannula ? ?Additional Equipment: None ? ?Intra-op Plan:  ? ?Post-operative Plan:  ? ?Informed Consent: I  have reviewed the patients History and  Physical, chart, labs and discussed the procedure including the risks, benefits and alternatives for the proposed anesthesia with the patient or authorized representative who has indicated his/her understanding and acceptance.  ? ? ? ?Dental advisory given ? ?Plan Discussed with: CRNA and Anesthesiologist ? ?Anesthesia Plan Comments:   ? ? ? ? ? ? ?Anesthesia Quick Evaluation ? ?

## 2021-08-26 NOTE — Discharge Instructions (Signed)

## 2021-08-27 DIAGNOSIS — T84032A Mechanical loosening of internal right knee prosthetic joint, initial encounter: Secondary | ICD-10-CM | POA: Diagnosis not present

## 2021-08-27 LAB — CBC
HCT: 33.6 % — ABNORMAL LOW (ref 36.0–46.0)
Hemoglobin: 10.7 g/dL — ABNORMAL LOW (ref 12.0–15.0)
MCH: 29.6 pg (ref 26.0–34.0)
MCHC: 31.8 g/dL (ref 30.0–36.0)
MCV: 93.1 fL (ref 80.0–100.0)
Platelets: 214 10*3/uL (ref 150–400)
RBC: 3.61 MIL/uL — ABNORMAL LOW (ref 3.87–5.11)
RDW: 12.9 % (ref 11.5–15.5)
WBC: 12.2 10*3/uL — ABNORMAL HIGH (ref 4.0–10.5)
nRBC: 0 % (ref 0.0–0.2)

## 2021-08-27 LAB — BASIC METABOLIC PANEL
Anion gap: 8 (ref 5–15)
BUN: 15 mg/dL (ref 6–20)
CO2: 28 mmol/L (ref 22–32)
Calcium: 9.1 mg/dL (ref 8.9–10.3)
Chloride: 103 mmol/L (ref 98–111)
Creatinine, Ser: 0.76 mg/dL (ref 0.44–1.00)
GFR, Estimated: >60 mL/min (ref >60)
Glucose, Bld: 122 mg/dL — ABNORMAL HIGH (ref 70–99)
Potassium: 4.4 mmol/L (ref 3.5–5.1)
Sodium: 139 mmol/L (ref 135–145)

## 2021-08-27 NOTE — Evaluation (Signed)
Occupational Therapy Evaluation ?Patient Details ?Name: Samantha Curtis ?MRN: [id number] ?DOB: 27-Aug-1965 ?Today's Date: 08/27/2021 ? ? ?History of Present Illness 56 y/o female admitted on 08/26/21 following R TKA revision. PMH R TKA 2019.  ? ?Clinical Impression ?  ?Samantha Curtis was evaluated s/p the above knee sx, she is indep at baseline and lives at home with her husband who is able to assist as needed at d/c. Pt was generally min guard - supervision for functional ambulation and ADLs with RW this session. No AE used, however pt educated on equipment to incr indep. Pt did not need physical help this session. OT to continue to follow acutely, but no follow up needs. Recommend d/ to home with support of husband.  ?   ? ?Recommendations for follow up therapy are one component of a multi-disciplinary discharge planning process, led by the attending physician.  Recommendations may be updated based on patient status, additional functional criteria and insurance authorization.  ? ?Follow Up Recommendations ? No OT follow up  ?  ?   ?Patient can return home with the following A little help with walking and/or transfers;Two people to help with walking and/or transfers;Assist for transportation ? ?  ?Functional Status Assessment ? Patient has had a recent decline in their functional status and demonstrates the ability to make significant improvements in function in a reasonable and predictable amount of time.  ?Equipment Recommendations ? None recommended by OT  ?  ?Recommendations for Other Services   ? ? ?  ?Precautions / Restrictions Precautions ?Precautions: Knee ?Precaution Booklet Issued: No ?Restrictions ?Weight Bearing Restrictions: Yes ?RLE Weight Bearing: Weight bearing as tolerated  ? ?  ? ?Mobility Bed Mobility ?Overal bed mobility: Modified Independent ?  ?  ?  ?  ?  ?  ?  ?  ? ?Transfers ?Overall transfer level: Needs assistance ?Equipment used: Rolling walker (2 wheels) ?Transfers: Sit to/from Stand ?Sit to Stand: Min  guard ?  ?  ?  ?  ?  ?General transfer comment: min guard for safety from low bed surface ?  ? ?  ?Balance Overall balance assessment: Needs assistance ?Sitting-balance support: No upper extremity supported, Feet supported ?Sitting balance-[name]: Good ?  ?  ?Standing balance support: Single extremity supported, During functional activity ?Standing balance-[name]: Fair ?Standing balance comment: statically stands with only 1 UE supported ?  ?  ?  ?  ?  ?  ?  ?  ?  ?  ?  ?   ? ?ADL either performed or assessed with clinical judgement  ? ?ADL Overall ADL's : Needs assistance/impaired ?Eating/Feeding: Independent ?  ?Grooming: Supervision/safety ?  ?Upper Body Bathing: Set up;Sitting ?  ?Lower Body Bathing: Supervison/ safety;Sit to/from stand ?  ?Upper Body Dressing : Set up;Sitting ?  ?Lower Body Dressing: Supervision/safety;Sit to/from stand ?  ?Toilet Transfer: Supervision/safety;Rolling walker (2 wheels);Ambulation ?  ?Toileting- Clothing Manipulation and Hygiene: Modified independent ?  ?  ?  ?Functional mobility during ADLs: Supervision/safety;Rolling walker (2 wheels) ?General ADL Comments: no physical assist required, incr time for safety and pain management  ? ? ? ?Vision Baseline Vision/History: 0 No visual deficits ?Ability to See in Adequate Light: 0 Adequate ?Vision Assessment?: No apparent visual deficits  ?   ?   ?   ? ?Pertinent Vitals/Pain Pain Assessment ?Pain Assessment: Faces ?Faces Pain Scale: Hurts even more ?Pain Location: Rknee ?Pain Descriptors / Indicators: Discomfort ?Pain Intervention(s): Monitored during session  ? ? ? ?Hand Dominance   ?  ?  Extremity/Trunk Assessment Upper Extremity Assessment ?Upper Extremity Assessment: Overall WFL for tasks assessed ?  ?Lower Extremity Assessment ?Lower Extremity Assessment: RLE deficits/detail ?RLE Deficits / Details: expected post op weakness. Hip flexion 4/5, quad 2-/5 at this time ?RLE: Unable to fully assess due to pain ?  ?Cervical /  Trunk Assessment ?Cervical / Trunk Assessment: Normal ?  ?Communication Communication ?Communication: No difficulties ?  ?Cognition Arousal/Alertness: Awake/alert ?Behavior During Therapy: [facility] for tasks assessed/performed ?Overall Cognitive Status: Within Functional Limits for tasks assessed ?  ?  ?  ?  ?  ?  ?  ?  ?  ?  ?  ?  ?  ?  ?  ?  ?  ?  ?  ?General Comments  VSS onRA ? ?  ? ?Home Living Family/patient expects to be discharged to:: Private residence ?Living Arrangements: Spouse/significant other ?Available Help at Discharge: Family ?Type of Home: House ?Home Access: Stairs to enter ?Entrance Stairs-Number of Steps: 1 ?Entrance Stairs-Rails: None ?Home Layout: Two level;Bed/bath upstairs ?Alternate Level Stairs-Number of Steps: flight ?Alternate Level Stairs-Rails: Left ?Bathroom Shower/Tub: Walk-in shower ?  ?Bathroom Toilet: Handicapped height ?  ?  ?Home Equipment: Agricultural consultant (2 wheels);Crutches ?  ?  ?  ? ?  ?Prior Functioning/Environment Prior Level of Function : Independent/Modified Independent;Working/employed;Driving ?  ?  ?  ?  ?  ?  ?  ?  ?  ? ?  ?  ?OT Problem List: Decreased strength;Decreased range of motion;Decreased activity tolerance;Impaired balance (sitting and/or standing);Decreased safety awareness;Pain ?  ?   ?OT Treatment/Interventions: Self-care/ADL training;Therapeutic exercise;Patient/family education;DME and/or AE instruction  ?  ?OT Goals(Current goals can be found in the care plan section) Acute Rehab OT Goals ?Patient Stated Goal: home monday ?OT Goal Formulation: With patient ?Time For Goal Achievement: 09/10/21 ?Potential to Achieve Goals: Good ?ADL Goals ?Pt Will Transfer to Toilet: with modified independence;ambulating ?Additional ADL Goal #1: Pt will complete all BADLs with mod I  ?OT Frequency: Min 2X/week ?  ? ?   ?AM-PAC OT "6 Clicks" Daily Activity     ?Outcome Measure Help from another person eating meals?: None ?Help from another person taking care of personal  grooming?: None ?Help from another person toileting, which includes using toliet, bedpan, or urinal?: A Little ?Help from another person bathing (including washing, rinsing, drying)?: A Little ?Help from another person to put on and taking off regular upper body clothing?: None ?Help from another person to put on and taking off regular lower body clothing?: A Little ?6 Click Score: 21 ?  ?End of Session CPM Right Knee ?CPM Right Knee: Off ?Right Knee Flexion (Degrees): 0 ?Right Knee Extension (Degrees): 60 ? ?Activity Tolerance:   ?Patient left:   ? ?OT Visit Diagnosis: Unsteadiness on feet (R26.81);Muscle weakness (generalized) (M62.81);Pain  ?              ?Time: 8101-7510 ?OT Time Calculation (min): 13 min ?Charges:  OT General Charges ?$OT Visit: 1 Visit ?OT Evaluation ?$OT Eval Low Complexity: 1 Low ? ? ? ?Keefer Soulliere A Shaquel Josephson ?08/27/2021, 10:11 AM ?

## 2021-08-27 NOTE — Progress Notes (Signed)
Orthopedic Tech Progress Note ?Patient Details:  ?Samantha Curtis ?August 01, 1965 ?[id number] ? ?CPM Right Knee ?CPM Right Knee: On ?Right Knee Flexion (Degrees): 0 ?Right Knee Extension (Degrees): 60 ? ?Post Interventions ?Patient Tolerated: Well ?Instructions Provided: Care of device, Adjustment of device ? ?Karolee Stamps ?08/27/2021, 5:50 AM ? ?

## 2021-08-27 NOTE — Progress Notes (Signed)
Orthopedic Tech Progress Note ?Patient Details:  ?Samantha Curtis ?11/23/1965 ?[id number] ? ?Ortho Devices ?Type of Ortho Device: Bone foam zero knee ?Ortho Device/Splint Location: bone foam delivered yesterday. ?Ortho Device/Splint Interventions: Ordered ?  ?Post Interventions ?Patient Tolerated: Well ?Instructions Provided: Care of device, Adjustment of device ? ?Trinna Post ?08/27/2021, 5:50 AM ? ?

## 2021-08-27 NOTE — Progress Notes (Signed)
? ?  Subjective: ? ?Patient reports pain mild controlled with pain medication.  She has been in her CPM machine.  She tolerated some food this morning.  States that she is having tingling in the foot consistent with her block wearing off ? ?Objective:  ? ?VITALS:   ?Vitals:  ? 08/26/21 1718 08/26/21 2028 08/26/21 2351 08/27/21 0501  ?BP: 116/69 (!) 102/59 102/63 118/64  ?Pulse: 65 (!) 56 61 61  ?Resp: 17 16 18 18   ?Temp: 97.8 ?F (36.6 ?C) 97.6 ?F (36.4 ?C) 97.6 ?F (36.4 ?C) 98 ?F (36.7 ?C)  ?TempSrc: Oral Oral Oral Oral  ?SpO2: 100% 100% 100% 100%  ? ? ?Right knee dressings are clean dry and intact.  She is able to dorsiflex and plantarflex of the right foot.  Sensation is slightly intact in all distributions of the right foot.  2+ dorsalis pedis pulse. ? ? ?Lab Results  ?Component Value Date  ? WBC 12.2 (H) 08/27/2021  ? HGB 10.7 (L) 08/27/2021  ? HCT 33.6 (L) 08/27/2021  ? MCV 93.1 08/27/2021  ? PLT 214 08/27/2021  ? ? ? ?Assessment/Plan: ? ?1 Day Post-Op status post right revision total knee arthroplasty doing well. ? ?- Expected postop acute blood loss anemia acute- will monitor for symptoms ?- Patient to work with PT/OT to optimize mobilization safely ?- DVT ppx - SCDs, ambulation, aspirin ?- WBAT operative extremity ?- Pain control - multimodal pain management, ATC acetaminophen in conjunction with as needed narcotic (oxycodone), although this should be minimized with other modalities  ?-Plan to mobilize with PT over the weekend, plan DC monday ? ?Miroslava Santellan ?08/27/2021, 7:42 AM ? ?

## 2021-08-27 NOTE — Evaluation (Signed)
Physical Therapy Evaluation ?Patient Details ?Name: Samantha Curtis ?MRN: [id number] ?DOB: 01/16/1966 ?Today's Date: 08/27/2021 ? ?History of Present Illness ? 56 y/o female admitted on 08/26/21 following R TKA revision. PMH R TKA 2019.  ?Clinical Impression ? Patient admitted following above procedure. Patient presents with R knee ROM deficits, post op weakness, and impaired balance. Patient functioning at modI for bed mobility and supervision for transfers, ambulation, and stair negotiation. Educated patient on quad sets, heel slides, CPM, bone foam, and importance of knee extension. Patient will benefit from skilled PT services during acute stay to address listed deficits. Will follow acutely. Patient states she has OPPT appointments set up for next week.    ?   ? ?Recommendations for follow up therapy are one component of a multi-disciplinary discharge planning process, led by the attending physician.  Recommendations may be updated based on patient status, additional functional criteria and insurance authorization. ? ?Follow Up Recommendations Follow physician's recommendations for discharge plan and follow up therapies ? ?  ?Assistance Recommended at Discharge Intermittent Supervision/Assistance  ?Patient can return home with the following ?   ? ?  ?Equipment Recommendations None recommended by PT  ?Recommendations for Other Services ?    ?  ?Functional Status Assessment Patient has had a recent decline in their functional status and demonstrates the ability to make significant improvements in function in a reasonable and predictable amount of time.  ? ?  ?Precautions / Restrictions Precautions ?Precautions: Knee ?Precaution Booklet Issued: No ?Restrictions ?Weight Bearing Restrictions: Yes ?RLE Weight Bearing: Weight bearing as tolerated  ? ?  ? ?Mobility ? Bed Mobility ?Overal bed mobility: Modified Independent ?  ?  ?  ?  ?  ?  ?  ?  ? ?Transfers ?Overall transfer level: Needs assistance ?Equipment used: Rolling  Qais Jowers (2 wheels) ?Transfers: Sit to/from Stand ?Sit to Stand: Min guard ?  ?  ?  ?  ?  ?General transfer comment: min guard for safety from low bed surface ?  ? ?Ambulation/Gait ?Ambulation/Gait assistance: Supervision ?Gait Distance (Feet): 350 Feet ?Assistive device: Rolling Laithan Conchas (2 wheels) ?Gait Pattern/deviations: Step-through pattern, Decreased stride length ?Gait velocity: decreased ?  ?  ?General Gait Details: initially very chopping motion with ambulation and RW. Instructed patient to "push like a grocery cart" for more fluid gait pattern. Cues for heel strike to promote knee extension. ? ?Stairs ?Stairs: Yes ?Stairs assistance: Supervision ?Stair Management: One rail Left, Step to pattern, Forwards (HHA on R) ?Number of Stairs: 10 ?  ? ?Wheelchair Mobility ?  ? ?Modified Rankin (Stroke Patients Only) ?  ? ?  ? ?Balance Overall balance assessment: Needs assistance ?Sitting-balance support: No upper extremity supported, Feet supported ?Sitting balance-[name]: Good ?  ?  ?Standing balance support: Bilateral upper extremity supported, Reliant on assistive device for balance ?Standing balance-[name]: Poor ?Standing balance comment: reliant on RW at this time ?  ?  ?  ?  ?  ?  ?  ?  ?  ?  ?  ?   ? ? ? ?Pertinent Vitals/Pain Pain Assessment ?Pain Assessment: Faces ?Faces Pain Scale: Hurts even more ?Pain Location: Rknee ?Pain Descriptors / Indicators: Discomfort ?Pain Intervention(s): Monitored during session, Repositioned  ? ? ?Home Living Family/patient expects to be discharged to:: Private residence ?Living Arrangements: Spouse/significant other ?Available Help at Discharge: Family ?Type of Home: House ?Home Access: Stairs to enter ?Entrance Stairs-Rails: None ?Entrance Stairs-Number of Steps: 1 ?Alternate Level Stairs-Number of Steps: flight ?Home Layout: Two level;Bed/bath  upstairs ?Home Equipment: Agricultural consultant (2 wheels);Crutches ?   ?  ?Prior Function Prior Level of Function :  Independent/Modified Independent;Working/employed;Driving ?  ?  ?  ?  ?  ?  ?  ?  ?  ? ? ?Hand Dominance  ?   ? ?  ?Extremity/Trunk Assessment  ? Upper Extremity Assessment ?Upper Extremity Assessment: Overall WFL for tasks assessed ?  ? ?Lower Extremity Assessment ?Lower Extremity Assessment: RLE deficits/detail ?RLE Deficits / Details: expected post op weakness. Hip flexion 4/5, quad 2-/5 at this time ?RLE: Unable to fully assess due to pain ?  ? ?Cervical / Trunk Assessment ?Cervical / Trunk Assessment: Normal  ?Communication  ? Communication: No difficulties  ?Cognition Arousal/Alertness: Awake/alert ?Behavior During Therapy: [facility] for tasks assessed/performed ?Overall Cognitive Status: Within Functional Limits for tasks assessed ?  ?  ?  ?  ?  ?  ?  ?  ?  ?  ?  ?  ?  ?  ?  ?  ?  ?  ?  ? ?  ?General Comments   ? ?  ?Exercises Total Joint Exercises ?Quad Sets: AROM, Right, 5 reps ?Heel Slides: AROM, Right, 5 reps  ? ?Assessment/Plan  ?  ?PT Assessment Patient needs continued PT services  ?PT Problem List Decreased strength;Decreased range of motion;Decreased activity tolerance;Decreased balance ? ?   ?  ?PT Treatment Interventions DME instruction;Gait training;Stair training;Functional mobility training;Therapeutic activities;Therapeutic exercise;Balance training;Patient/family education   ? ?PT Goals (Current goals can be found in the Care Plan section)  ?Acute Rehab PT Goals ?Patient Stated Goal: to get stronger ?PT Goal Formulation: With patient ?Time For Goal Achievement: 09/10/21 ?Potential to Achieve Goals: Good ? ?  ?Frequency 7X/week ?  ? ? ?Co-evaluation   ?  ?  ?  ?  ? ? ?  ?AM-PAC PT "6 Clicks" Mobility  ?Outcome Measure Help needed turning from your back to your side while in a flat bed without using bedrails?: None ?Help needed moving from lying on your back to sitting on the side of a flat bed without using bedrails?: None ?Help needed moving to and from a bed to a chair (including a wheelchair)?: A  Little ?Help needed standing up from a chair using your arms (e.g., wheelchair or bedside chair)?: A Little ?Help needed to walk in hospital room?: A Little ?Help needed climbing 3-5 steps with a railing? : A Little ?6 Click Score: 20 ? ?  ?End of Session   ?Activity Tolerance: Patient tolerated treatment well ?Patient left: in chair;with call bell/phone within reach ?Nurse Communication: Mobility status ?PT Visit Diagnosis: Muscle weakness (generalized) (M62.81);Unsteadiness on feet (R26.81) ?  ? ?Time: [id number] ?PT Time Calculation (min) (ACUTE ONLY): 33 min ? ? ?Charges:   PT Evaluation ?$PT Eval Low Complexity: 1 Low ?PT Treatments ?$Gait Training: 8-22 mins ?  ?   ? ? ?Brixton Franko A. Dan Humphreys, PT, DPT ?Acute Rehabilitation Services ?Pager [phone] ?Office [phone] ? ? ?Antonella Upson A Kimmie Doren ?08/27/2021, 9:51 AM ? ?

## 2021-08-27 NOTE — Progress Notes (Signed)
Physical Therapy Treatment ?Patient Details ?Name: Samantha Curtis ?MRN: [id number] ?DOB: 1965-12-10 ?Today's Date: 08/27/2021 ? ? ?History of Present Illness 56 y/o female admitted on 08/26/21 following R TKA revision. PMH R TKA 2019. ? ?  ?PT Comments  ? ?  Patient progressing well towards physical therapy goals. Patient's gait quality continues to improve with heel strike to promote knee extension. Provided patient with HEP handout and instructed patient on frequency and repetitions, patient verbalized understanding. Encouraged patient to ambulate in hallways with nursing staff. D/c plan remains appropriate.  ?  ?Recommendations for follow up therapy are one component of a multi-disciplinary discharge planning process, led by the attending physician.  Recommendations may be updated based on patient status, additional functional criteria and insurance authorization. ? ?Follow Up Recommendations ? Follow physician's recommendations for discharge plan and follow up therapies ?  ?  ?Assistance Recommended at Discharge Intermittent Supervision/Assistance  ?Patient can return home with the following   ?  ?Equipment Recommendations ? None recommended by PT  ?  ?Recommendations for Other Services   ? ? ?  ?Precautions / Restrictions Precautions ?Precautions: Knee ?Precaution Booklet Issued: Yes (comment) ?Precaution Comments: provided HEP handout ?Restrictions ?Weight Bearing Restrictions: Yes ?RLE Weight Bearing: Weight bearing as tolerated  ?  ? ?Mobility ? Bed Mobility ?Overal bed mobility: Modified Independent ?  ?  ?  ?  ?  ?  ?  ?  ? ?Transfers ?Overall transfer level: Needs assistance ?Equipment used: Rolling [name] (2 wheels) ?Transfers: Sit to/from Stand ?Sit to Stand: Supervision ?  ?  ?  ?  ?  ?  ?  ? ?Ambulation/Gait ?Ambulation/Gait assistance: Supervision ?Gait Distance (Feet): 400 Feet ?Assistive device: Rolling [name] (2 wheels) ?Gait Pattern/deviations: Step-through pattern, Decreased stride length ?Gait  velocity: decreased ?  ?  ?General Gait Details: improved gait quality. Slight cues at beginning for heel strike but great follow through ? ? ?Stairs ?  ?  ?  ?  ?  ? ? ?Wheelchair Mobility ?  ? ?Modified Rankin (Stroke Patients Only) ?  ? ? ?  ?Balance Overall balance assessment: Needs assistance ?Sitting-balance support: No upper extremity supported, Feet supported ?Sitting balance-[name]: Good ?  ?  ?Standing balance support: Bilateral upper extremity supported, Reliant on assistive device for balance ?Standing balance-[name]: Fair ?  ?  ?  ?  ?  ?  ?  ?  ?  ?  ?  ?  ?  ? ?  ?Cognition Arousal/Alertness: Awake/alert ?Behavior During Therapy: [name] for tasks assessed/performed ?Overall Cognitive Status: Within Functional Limits for tasks assessed ?  ?  ?  ?  ?  ?  ?  ?  ?  ?  ?  ?  ?  ?  ?  ?  ?  ?  ?  ? ?  ?Exercises Other Exercises ?Other Exercises: Provided HEP handout and instructed patient on exercises to perform in room ? ?  ?General Comments   ?  ?  ? ?Pertinent Vitals/Pain Pain Assessment ?Pain Assessment: 0-10 ?Pain Score: 5  ?Pain Location: Rknee ?Pain Descriptors / Indicators: Discomfort ?Pain Intervention(s): Monitored during session  ? ? ?Home Living   ?  ?  ?  ?  ?  ?  ?  ?  ?  ?   ?  ?Prior Function    ?  ?  ?   ? ?PT Goals (current goals can now be found in the care plan section) Acute Rehab PT Goals ?Patient  Stated Goal: to get stronger ?PT Goal Formulation: With patient ?Time For Goal Achievement: 09/10/21 ?Potential to Achieve Goals: Good ?Progress towards PT goals: Progressing toward goals ? ?  ?Frequency ? ? ? 7X/week ? ? ? ?  ?PT Plan Current plan remains appropriate  ? ? ?Co-evaluation   ?  ?  ?  ?  ? ?  ?AM-PAC PT "6 Clicks" Mobility   ?Outcome Measure ? Help needed turning from your back to your side while in a flat bed without using bedrails?: None ?Help needed moving from lying on your back to sitting on the side of a flat bed without using bedrails?: None ?Help needed moving to  and from a bed to a chair (including a wheelchair)?: A Little ?Help needed standing up from a chair using your arms (e.g., wheelchair or bedside chair)?: A Little ?Help needed to walk in hospital room?: A Little ?Help needed climbing 3-5 steps with a railing? : A Little ?6 Click Score: 20 ? ?  ?End of Session   ?Activity Tolerance: Patient tolerated treatment well ?Patient left: in bed;with call bell/phone within reach;with family/visitor present ?Nurse Communication: Mobility status ?PT Visit Diagnosis: Muscle weakness (generalized) (M62.81);Unsteadiness on feet (R26.81) ?  ? ? ?Time: 2778-2423 ?PT Time Calculation (min) (ACUTE ONLY): 19 min ? ?Charges:  $Gait Training: 8-22 mins          ?          ? ?Gayna Braddy A. Dan Humphreys, PT, DPT ?Acute Rehabilitation Services ?Pager [phone] ?Office [phone] ? ? ? ?Teana Lindahl A Braelynne Garinger ?08/27/2021, 4:43 PM ? ?

## 2021-08-27 NOTE — Progress Notes (Signed)
Patient was transported to 5N32 via bed by RN and NT; in no acute distress nor complaints of pain nor discomfort; CPM machine and bone foam and all belongings sent along with her; bedside report given to receiving RN Helene Kelp. ?

## 2021-08-28 DIAGNOSIS — T84032A Mechanical loosening of internal right knee prosthetic joint, initial encounter: Secondary | ICD-10-CM | POA: Diagnosis not present

## 2021-08-28 MED ORDER — DIPHENHYDRAMINE HCL 50 MG/ML IJ SOLN
25.0000 mg | Freq: Once | INTRAMUSCULAR | Status: AC
  Administered 2021-08-28: 25 mg via INTRAVENOUS
  Filled 2021-08-28: qty 1

## 2021-08-28 MED ORDER — HYDROMORPHONE HCL 2 MG PO TABS
4.0000 mg | ORAL_TABLET | ORAL | Status: DC | PRN
  Administered 2021-08-29: 4 mg via ORAL
  Filled 2021-08-28: qty 2

## 2021-08-28 MED ORDER — HYDROMORPHONE HCL 2 MG PO TABS
2.0000 mg | ORAL_TABLET | ORAL | Status: DC | PRN
  Administered 2021-08-28: 2 mg via ORAL
  Filled 2021-08-28: qty 1

## 2021-08-28 NOTE — Progress Notes (Signed)
Pt reports itching "all over" states occurred after receiving oxycodone for pain- also this happened after another surgery with taking oxycodone.On call phys notified and orders recieved ?

## 2021-08-28 NOTE — Progress Notes (Signed)
Physical Therapy Treatment ?Patient Details ?Name: Samantha Curtis ?MRN: [id number] ?DOB: 27-Jul-1965 ?Today's Date: 08/28/2021 ? ? ?History of Present Illness 56 y/o female admitted on 08/26/21 following R TKA revision. PMH R TKA 2019. ? ?  ?PT Comments  ? ? Pt tolerates treatment well, ambulating for increased distances and negotiating 2 flights of stairs. Pt is ambulating without assistance with use of RW, even taking a few steps without device between flights of stairs. Pt reports good performance of HEP and is in agreement to ambulate multiple times daily. PT will follow up tomorrow for progression of gait training and HEP.   ?Recommendations for follow up therapy are one component of a multi-disciplinary discharge planning process, led by the attending physician.  Recommendations may be updated based on patient status, additional functional criteria and insurance authorization. ? ?Follow Up Recommendations ? Follow physician's recommendations for discharge plan and follow up therapies ?  ?  ?Assistance Recommended at Discharge Intermittent Supervision/Assistance  ?Patient can return home with the following Assist for transportation ?  ?Equipment Recommendations ? None recommended by PT  ?  ?Recommendations for Other Services   ? ? ?  ?Precautions / Restrictions Precautions ?Precautions: Knee ?Precaution Booklet Issued: Yes (comment) ?Precaution Comments: reviewed HEP handout ?Restrictions ?Weight Bearing Restrictions: Yes ?RLE Weight Bearing: Weight bearing as tolerated  ?  ? ?Mobility ? Bed Mobility ?Overal bed mobility: Modified Independent ?  ?  ?  ?  ?  ?  ?  ?  ? ?Transfers ?Overall transfer level: Independent ?Equipment used: None ?Transfers: Sit to/from Stand ?Sit to Stand: Independent ?  ?  ?  ?  ?  ?  ?  ? ?Ambulation/Gait ?Ambulation/Gait assistance: Modified independent (Device/Increase time) ?Gait Distance (Feet): 500 Feet ?Assistive device: Rolling walker (2 wheels) ?Gait Pattern/deviations: Step-through  pattern ?Gait velocity: functional ?Gait velocity interpretation: 1.31 - 2.62 ft/sec, indicative of limited community ambulator ?  ?General Gait Details: pt with slowed step-through gait, reduced stance time on RLE ? ? ?Stairs ?Stairs: Yes ?Stairs assistance: Supervision ?Stair Management: One rail Left, Step to pattern, Forwards ?Number of Stairs: 20 ?  ? ? ?Wheelchair Mobility ?  ? ?Modified Rankin (Stroke Patients Only) ?  ? ? ?  ?Balance Overall balance assessment: Needs assistance ?Sitting-balance support: No upper extremity supported, Feet supported ?Sitting balance-[name]: Good ?  ?  ?Standing balance support: Single extremity supported, Reliant on assistive device for balance ?Standing balance-[name]: Poor ?  ?  ?  ?  ?  ?  ?  ?  ?  ?  ?  ?  ?  ? ?  ?Cognition Arousal/Alertness: Awake/alert ?Behavior During Therapy: [facility] for tasks assessed/performed ?Overall Cognitive Status: Within Functional Limits for tasks assessed ?  ?  ?  ?  ?  ?  ?  ?  ?  ?  ?  ?  ?  ?  ?  ?  ?  ?  ?  ? ?  ?Exercises Other Exercises ?Other Exercises: PT reviews seated knee flexion with overpressure from LLE to aide in improving knee flexion ROM ? ?  ?General Comments General comments (skin integrity, edema, etc.): VSS on RA ?  ?  ? ?Pertinent Vitals/Pain Pain Assessment ?Pain Assessment: 0-10 ?Pain Score: 4  ?Pain Location: R knee ?Pain Descriptors / Indicators: Grimacing ?Pain Intervention(s): Monitored during session  ? ? ?Home Living   ?  ?  ?  ?  ?  ?  ?  ?  ?  ?   ?  ?  Prior Function    ?  ?  ?   ? ?PT Goals (current goals can now be found in the care plan section) Acute Rehab PT Goals ?Patient Stated Goal: to get stronger ?Progress towards PT goals: Progressing toward goals ? ?  ?Frequency ? ? ? 7X/week ? ? ? ?  ?PT Plan Current plan remains appropriate  ? ? ?Co-evaluation   ?  ?  ?  ?  ? ?  ?AM-PAC PT "6 Clicks" Mobility   ?Outcome Measure ? Help needed turning from your back to your side while in a flat bed without  using bedrails?: None ?Help needed moving from lying on your back to sitting on the side of a flat bed without using bedrails?: None ?Help needed moving to and from a bed to a chair (including a wheelchair)?: None ?Help needed standing up from a chair using your arms (e.g., wheelchair or bedside chair)?: None ?Help needed to walk in hospital room?: None ?Help needed climbing 3-5 steps with a railing? : A Little ?6 Click Score: 23 ? ?  ?End of Session   ?Activity Tolerance: Patient tolerated treatment well ?Patient left: in chair;with call bell/phone within reach ?Nurse Communication: Mobility status ?PT Visit Diagnosis: Muscle weakness (generalized) (M62.81);Unsteadiness on feet (R26.81) ?  ? ? ?Time: 1572-6203 ?PT Time Calculation (min) (ACUTE ONLY): 24 min ? ?Charges:  $Gait Training: 23-37 mins          ?          ? ?Arlyss Gandy, PT, DPT ?Acute Rehabilitation ?Pager: [phone] ?Office [phone] ? ? ? ?Arlyss Gandy ?08/28/2021, 9:53 AM ? ?

## 2021-08-28 NOTE — Progress Notes (Signed)
? ?  Subjective: ? ?Reported itchiness all over with oxycodone.  Plan for transition to Dilaudid pills as result.  Given 1 dose of Benadryl which improved significantly.  Block is worn off.  Pain is well controlled. ? ?Objective:  ? ?VITALS:   ?Vitals:  ? 08/27/21 1452 08/27/21 1700 08/27/21 2058 08/28/21 0517  ?BP: (!) 104/53  118/68 119/70  ?Pulse: 61  65 (!) 56  ?Resp: 17  14   ?Temp: 98.7 ?F (37.1 ?C)  98.2 ?F (36.8 ?C) 98.5 ?F (36.9 ?C)  ?TempSrc: Oral  Oral Oral  ?SpO2: 97%  99% 100%  ?Weight:  59 kg    ?Height:  5' 2.5" (1.588 m)    ? ? ?Right knee dressings are clean dry and intact.  She is able to dorsiflex and plantarflex of the right foot.  Sensation is slightly intact in all distributions of the right foot.  2+ dorsalis pedis pulse. ? ? ?Lab Results  ?Component Value Date  ? WBC 12.2 (H) 08/27/2021  ? HGB 10.7 (L) 08/27/2021  ? HCT 33.6 (L) 08/27/2021  ? MCV 93.1 08/27/2021  ? PLT 214 08/27/2021  ? ? ? ?Assessment/Plan: ? ?2 Days Post-Op status post right revision total knee arthroplasty doing well. ? ?- Expected postop acute blood loss anemia acute- will monitor for symptoms ?- Patient to work with PT/OT to optimize mobilization safely ?- DVT ppx - SCDs, ambulation, aspirin ?- WBAT operative extremity ?- Pain control - multimodal pain management, ATC acetaminophen in conjunction with oral Dilaudid tabs ?-Plan to mobilize with PT over the weekend, plan DC monday ? ?Kaeden Depaz ?08/28/2021, 7:55 AM ? ?

## 2021-08-28 NOTE — Plan of Care (Signed)
?  Problem: Safety: ?Goal: Ability to remain free from injury will improve ?Outcome: Progressing ?  ?Problem: Education: ?Goal: Knowledge of the prescribed therapeutic regimen will improve ?Outcome: Progressing ?Goal: Individualized Educational Video(s) ?Outcome: Progressing ?  ?

## 2021-08-29 ENCOUNTER — Other Ambulatory Visit: Payer: Self-pay | Admitting: Physician Assistant

## 2021-08-29 ENCOUNTER — Ambulatory Visit: Payer: No Typology Code available for payment source | Admitting: Physical Therapy

## 2021-08-29 ENCOUNTER — Other Ambulatory Visit (HOSPITAL_COMMUNITY): Payer: Self-pay

## 2021-08-29 DIAGNOSIS — T84032A Mechanical loosening of internal right knee prosthetic joint, initial encounter: Secondary | ICD-10-CM | POA: Diagnosis not present

## 2021-08-29 LAB — SURGICAL PATHOLOGY

## 2021-08-29 MED ORDER — DIPHENHYDRAMINE HCL 25 MG PO CAPS
25.0000 mg | ORAL_CAPSULE | Freq: Four times a day (QID) | ORAL | Status: DC | PRN
  Filled 2021-08-29: qty 1

## 2021-08-29 MED ORDER — HYDROMORPHONE HCL 2 MG PO TABS
2.0000 mg | ORAL_TABLET | Freq: Four times a day (QID) | ORAL | 0 refills | Status: DC | PRN
  Filled 2021-08-29: qty 30, 8d supply, fill #0

## 2021-08-29 NOTE — Progress Notes (Signed)
Physical Therapy Treatment ?Patient Details ?Name: Samantha Curtis ?MRN: [id number] ?DOB: 11-Oct-1965 ?Today's Date: 08/29/2021 ? ? ?History of Present Illness 56 y/o female admitted on 08/26/21 following R TKA revision. PMH R TKA 2019. ? ?  ?PT Comments  ? ? Patient progressing well towards PT goals. Pt reports being upset this AM and reports 8/10 pain in right knee. Noted to have some swelling and warmth as well. Donned ice post session and placed knee in zero degree knee foam. Session focused on gait and stair training as well as instructed pt on exercises that emphasized knee AROM into flexion/extension. Education on activity recommendations, ice usage, positioning etc. Pt eager to return home and safe to do so from a therapy standpoint.  ?   ?Recommendations for follow up therapy are one component of a multi-disciplinary discharge planning process, led by the attending physician.  Recommendations may be updated based on patient status, additional functional criteria and insurance authorization. ? ?Follow Up Recommendations ? Follow physician's recommendations for discharge plan and follow up therapies ?  ?  ?Assistance Recommended at Discharge Intermittent Supervision/Assistance  ?Patient can return home with the following Assist for transportation ?  ?Equipment Recommendations ? None recommended by PT  ?  ?Recommendations for Other Services   ? ? ?  ?Precautions / Restrictions Precautions ?Precautions: Knee ?Precaution Booklet Issued: Yes (comment) ?Precaution Comments: reviewed HEP handout ?Restrictions ?Weight Bearing Restrictions: Yes ?RLE Weight Bearing: Weight bearing as tolerated  ?  ? ?Mobility ? Bed Mobility ?Overal bed mobility: Modified Independent ?  ?  ?  ?  ?  ?  ?  ?  ? ?Transfers ?Overall transfer level: Modified independent ?Equipment used: Rolling walker (2 wheels) ?Transfers: Sit to/from Stand ?Sit to Stand: Modified independent (Device/Increase time) ?  ?  ?  ?  ?  ?General transfer comment: Stood  from EOB without difficulty. ?  ? ?Ambulation/Gait ?Ambulation/Gait assistance: Modified independent (Device/Increase time) ?Gait Distance (Feet): 400 Feet ?Assistive device: Rolling walker (2 wheels) ?Gait Pattern/deviations: Step-through pattern ?Gait velocity: functional ?Gait velocity interpretation: 1.31 - 2.62 ft/sec, indicative of limited community ambulator ?  ?General Gait Details: CUes for knee extension during stance phase and increase knee flexion during swing. ? ? ?Stairs ?Stairs: Yes ?Stairs assistance: Supervision ?Stair Management: One rail Left, Step to pattern, Forwards ?Number of Stairs: 13 ?  ? ? ?Wheelchair Mobility ?  ? ?Modified Rankin (Stroke Patients Only) ?  ? ? ?  ?Balance Overall balance assessment: Needs assistance ?Sitting-balance support: No upper extremity supported, Feet supported ?Sitting balance-[name]: Good ?  ?  ?Standing balance support: During functional activity ?Standing balance-[name]: Fair ?  ?  ?  ?  ?  ?  ?  ?  ?  ?  ?  ?  ?  ? ?  ?Cognition Arousal/Alertness: Awake/alert ?Behavior During Therapy: [facility] for tasks assessed/performed ?Overall Cognitive Status: Within Functional Limits for tasks assessed ?  ?  ?  ?  ?  ?  ?  ?  ?  ?  ?  ?  ?  ?  ?  ?  ?General Comments: Pt upset that she hadnt had her breakfast come up yet, no one has checked on her, no toilet paper in room etc. ?  ?  ? ?  ?Exercises Other Exercises ?Other Exercises: Worked on seated knee flexion stretch x4 ?Other Exercises: Step up with RLE onsto step and WB into knee flexion x4 ? ?  ?General Comments   ?  ?  ? ?  Pertinent Vitals/Pain Pain Assessment ?Pain Assessment: 0-10 ?Pain Score: 8  ?Pain Location: R knee ?Pain Descriptors / Indicators: Grimacing, Crying, Discomfort, Guarding ?Pain Intervention(s): Monitored during session, Ice applied, Patient requesting pain meds-RN notified  ? ? ?Home Living   ?  ?  ?  ?  ?  ?  ?  ?  ?  ?   ?  ?Prior Function    ?  ?  ?   ? ?PT Goals (current goals can now  be found in the care plan section) Progress towards PT goals: Progressing toward goals ? ?  ?Frequency ? ? ? 7X/week ? ? ? ?  ?PT Plan Current plan remains appropriate  ? ? ?Co-evaluation   ?  ?  ?  ?  ? ?  ?AM-PAC PT "6 Clicks" Mobility   ?Outcome Measure ? Help needed turning from your back to your side while in a flat bed without using bedrails?: None ?Help needed moving from lying on your back to sitting on the side of a flat bed without using bedrails?: None ?Help needed moving to and from a bed to a chair (including a wheelchair)?: None ?Help needed standing up from a chair using your arms (e.g., wheelchair or bedside chair)?: None ?Help needed to walk in hospital room?: None ?Help needed climbing 3-5 steps with a railing? : A Little ?6 Click Score: 23 ? ?  ?End of Session   ?Activity Tolerance: Patient tolerated treatment well ?Patient left: in bed;with call bell/phone within reach ?Nurse Communication: Mobility status;Patient requests pain meds ?PT Visit Diagnosis: Muscle weakness (generalized) (M62.81);Unsteadiness on feet (R26.81);Pain ?Pain - Right/Left: Right ?Pain - part of body: Knee ?  ? ? ?Time: 4709-6283 ?PT Time Calculation (min) (ACUTE ONLY): 24 min ? ?Charges:  $Gait Training: 23-37 mins          ?          ? ?Vale Haven, PT, DPT ?Acute Rehabilitation Services ?Pager [phone] ?Office [phone] ? ? ? ? ? ?Samantha Curtis ?08/29/2021, 9:47 AM ? ?

## 2021-08-29 NOTE — Plan of Care (Signed)
?  Problem: Safety: ?Goal: Ability to remain free from injury will improve ?Outcome: Adequate for Discharge ?  ?Problem: Education: ?Goal: Knowledge of the prescribed therapeutic regimen will improve ?Outcome: Adequate for Discharge ?Goal: Individualized Educational Video(s) ?Outcome: Adequate for Discharge ?  ?Problem: Activity: ?Goal: Ability to avoid complications of mobility impairment will improve ?Outcome: Adequate for Discharge ?Goal: Range of joint motion will improve ?Outcome: Adequate for Discharge ?  ?Problem: Clinical Measurements: ?Goal: Postoperative complications will be avoided or minimized ?Outcome: Adequate for Discharge ?  ?Problem: Pain Management: ?Goal: Pain level will decrease with appropriate interventions ?Outcome: Adequate for Discharge ?  ?Problem: Skin Integrity: ?Goal: Will show signs of wound healing ?Outcome: Adequate for Discharge ?  ?Problem: Acute Rehab PT Goals(only PT should resolve) ?Goal: Patient Will Transfer Sit To/From Stand ?Outcome: Adequate for Discharge ?Goal: Pt Will Ambulate ?Outcome: Adequate for Discharge ?Goal: Pt Will Go Up/Down Stairs ?Outcome: Adequate for Discharge ?Goal: Pt/caregiver will Perform Home Exercise Program ?Outcome: Adequate for Discharge ?  ?Problem: Acute Rehab OT Goals (only OT should resolve) ?Goal: Pt. Will Transfer To Toilet ?Outcome: Adequate for Discharge ?Goal: OT Additional ADL Goal #1 ?Outcome: Adequate for Discharge ?  ?

## 2021-08-29 NOTE — Progress Notes (Signed)
Subjective: ?3 Days Post-Op Procedure(s) (LRB): ?RIGHT TOTAL KNEE REVISION (Right) ?Patient reports pain as mild.  Experienced severe itching with post-op oxycodone.  Doing much better on dilaudid.  C/o headache today which she thinks is from caffeine wd ? ?Objective: ?Vital signs in last 24 hours: ?Temp:  [98.2 ?F (36.8 ?C)-98.7 ?F (37.1 ?C)] 98.3 ?F (36.8 ?C) (03/13 3086) ?Pulse Rate:  [72-76] 75 (03/13 0722) ?Resp:  [16-17] 16 (03/13 0722) ?BP: (120-142)/(66-79) 141/79 (03/13 5784) ?SpO2:  [97 %-99 %] 98 % (03/13 0722) ? ?Intake/Output from previous day: ?No intake/output data recorded. ?Intake/Output this shift: ?No intake/output data recorded. ? ?Recent Labs  ?  08/27/21 ?0614  ?HGB 10.7*  ? ?Recent Labs  ?  08/27/21 ?0614  ?WBC 12.2*  ?RBC 3.61*  ?HCT 33.6*  ?PLT 214  ? ?Recent Labs  ?  08/27/21 ?0614  ?NA 139  ?K 4.4  ?CL 103  ?CO2 28  ?BUN 15  ?CREATININE 0.76  ?GLUCOSE 122*  ?CALCIUM 9.1  ? ?No results for input(s): LABPT, INR in the last 72 hours. ? ?Neurologically intact ?Neurovascular intact ?Sensation intact distally ?Intact pulses distally ?Dorsiflexion/Plantar flexion intact ?Incision: scant drainage ?No cellulitis present ?Compartment soft ? ? ?Assessment/Plan: ?3 Days Post-Op Procedure(s) (LRB): ?RIGHT TOTAL KNEE REVISION (Right) ?Advance diet ?Up with therapy ?D/C IV fluids ?Discharge home with home health after first PT session ?WBAT RLE ?ABLA- mild and stable ?Intra-op cx negative to date ?Dilaudid erx to pharmacy ?F/u with Roda Shutters in two weeks ? ? ?Anticipated LOS equal to or greater than 2 midnights due to ?- Age 17 and older with one or more of the following: ? - Obesity ? - Expected need for hospital services (PT, OT, Nursing) required for safe  discharge ? - Anticipated need for postoperative skilled nursing care or inpatient rehab ? - Active co-morbidities: None ?OR  ? ?- Unanticipated findings during/Post Surgery: None  ?- Patient is a high risk of re-admission due to: None ? ? ?Cristie Hem ?08/29/2021, 7:35 AM ? ?

## 2021-08-29 NOTE — Progress Notes (Signed)
Occupational Therapy Treatment ?Patient Details ?Name: Samantha Curtis ?MRN: [id number] ?DOB: 09/16/1965 ?Today's Date: 08/29/2021 ? ? ?History of present illness 56 y/o female admitted on 08/26/21 following R TKA revision. PMH R TKA 2019. ?  ?OT comments ? Pt progressing well towards goals, upon arrival pt dressed for d/c, reports no difficulty with UB/LB dressing. Pt able to walk household distance in room/hall with supervision, mod I for bed mobility. Pt reporting 8/10 pain at this time, grimacing with movement. Pt presenting with impairments listed below, will follow acutely. Recommend d/c home with assistance.  ? ?Recommendations for follow up therapy are one component of a multi-disciplinary discharge planning process, led by the attending physician.  Recommendations may be updated based on patient status, additional functional criteria and insurance authorization. ?   ?Follow Up Recommendations ? No OT follow up  ?  ?Assistance Recommended at Discharge PRN  ?Patient can return home with the following ? A little help with walking and/or transfers;Two people to help with walking and/or transfers;Assist for transportation ?  ?Equipment Recommendations ? None recommended by OT  ?  ?Recommendations for Other Services   ? ?  ?Precautions / Restrictions Precautions ?Precautions: Knee ? ?Restrictions ?Weight Bearing Restrictions: Yes ?RLE Weight Bearing: Weight bearing as tolerated  ? ? ?  ? ?Mobility Bed Mobility ?Overal bed mobility: Modified Independent ?  ?  ?  ?  ?  ?  ?  ?  ? ?Transfers ?Overall transfer level: Modified independent ?Equipment used: Rolling walker (2 wheels) ?Transfers: Sit to/from Stand ?  ?  ?  ?  ?  ?  ?  ?  ?  ?Balance Overall balance assessment: Needs assistance ?Sitting-balance support: No upper extremity supported, Feet supported ?Sitting balance-[name]: Good ?  ?  ?Standing balance support: During functional activity ?Standing balance-[name]: Fair ?  ?  ?  ?  ?  ?  ?  ?  ?  ?  ?  ?  ?    ? ?ADL either performed or assessed with clinical judgement  ? ?ADL Overall ADL's : Needs assistance/impaired ?Eating/Feeding: Independent ?  ?Grooming: Supervision/safety ?  ?  ?  ?  ?  ?Upper Body Dressing : Independent;Sitting ?  ?Lower Body Dressing: Sit to/from stand;Independent ?  ?Toilet Transfer: Supervision/safety;Rolling walker (2 wheels);Ambulation ?  ?  ?  ?  ?  ?Functional mobility during ADLs: Supervision/safety;Rolling walker (2 wheels) ?General ADL Comments: no physical assist required, incr time for safety and pain management ?  ? ?Extremity/Trunk Assessment Upper Extremity Assessment ?Upper Extremity Assessment: Overall WFL for tasks assessed ?  ?Lower Extremity Assessment ?Lower Extremity Assessment: Defer to PT evaluation ?  ?  ?  ? ?Vision   ?Vision Assessment?: No apparent visual deficits ?  ?Perception Perception ?Perception: Not tested ?  ?Praxis Praxis ?Praxis: Not tested ?  ? ?Cognition Arousal/Alertness: Awake/alert ?Behavior During Therapy: [facility] for tasks assessed/performed ?Overall Cognitive Status: Within Functional Limits for tasks assessed ?  ?  ?  ?  ?  ?  ?  ?  ?  ?  ?  ?  ?  ?  ?  ?  ?  ?  ?  ?   ?Exercises   ? ?  ?Shoulder Instructions   ? ? ?  ?General Comments VSS on RA  ? ? ?Pertinent Vitals/ Pain       Pain Assessment ?Pain Assessment: Faces ?Pain Score: 8  ?Faces Pain Scale: Hurts whole lot ?Pain Location: R knee ?Pain  Descriptors / Indicators: Grimacing, Crying, Discomfort, Guarding ?Pain Intervention(s): Limited activity within patient's tolerance, Monitored during session, Patient requesting pain meds-RN notified, Repositioned, Ice applied ? ?Home Living   ?  ?  ?  ?  ?  ?  ?  ?  ?  ?  ?  ?  ?  ?  ?  ?  ?  ?  ? ?  ?Prior Functioning/Environment    ?  ?  ?  ?   ? ?Frequency ? Min 2X/week  ? ? ? ? ?  ?Progress Toward Goals ? ?OT Goals(current goals can now be found in the care plan section) ? Progress towards OT goals: Progressing toward goals ? ?Acute Rehab OT  Goals ?Patient Stated Goal: to go home ?OT Goal Formulation: With patient ?Time For Goal Achievement: 09/10/21 ?Potential to Achieve Goals: Good ?ADL Goals ?Pt Will Transfer to Toilet: with modified independence;ambulating ?Additional ADL Goal #1: Pt will complete all BADLs with mod I  ?Plan Discharge plan remains appropriate;Frequency remains appropriate   ? ?Co-evaluation ? ? ?   ?  ?  ?  ?  ? ?  ?AM-PAC OT "6 Clicks" Daily Activity     ?Outcome Measure ? ? Help from another person eating meals?: None ?Help from another person taking care of personal grooming?: None ?Help from another person toileting, which includes using toliet, bedpan, or urinal?: A Little ?Help from another person bathing (including washing, rinsing, drying)?: A Little ?Help from another person to put on and taking off regular upper body clothing?: None ?Help from another person to put on and taking off regular lower body clothing?: A Little ?6 Click Score: 21 ? ?  ?End of Session Equipment Utilized During Treatment: Rolling walker (2 wheels) ?CPM Right Knee ?CPM Right Knee: Off ? ?OT Visit Diagnosis: Unsteadiness on feet (R26.81);Muscle weakness (generalized) (M62.81);Pain ?  ?Activity Tolerance Patient limited by pain ?  ?Patient Left in bed;with call bell/phone within reach;with bed alarm set ?  ?Nurse Communication Mobility status;Patient requests pain meds ?  ? ?   ? ?Time: 0981-1914 ?OT Time Calculation (min): 10 min ? ?Charges: OT General Charges ?$OT Visit: 1 Visit ?OT Treatments ?$Therapeutic Activity: 8-22 mins ? ?Alfonzo Beers, OTD, OTR/L ?Acute Rehab ?[phone] ? ? ?Mayer Masker ?08/29/2021, 10:24 AM ?

## 2021-08-29 NOTE — Progress Notes (Signed)
Per ortho PA, patient to dc home after therapy today (after lunch). ?

## 2021-08-29 NOTE — Discharge Summary (Signed)
Patient ID: ?Samantha Curtis ?MRN: [id number] ?DOB/AGE: Mar 05, 1966 56 y.o. ? ?Admit date: 08/26/2021 ?Discharge date: 08/29/2021 ? ?Admission Diagnoses:  ?Principal Problem: ?  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  Status post total right knee replacement ?  Status post revision of total replacement of right knee ? ? ?Discharge Diagnoses:  ?Same ? ?Past Medical History:  ?Diagnosis Date  ? Arthritis   ? "knees" (07/04/2017)  ? Complication of anesthesia   ? Dysrhythmia   ? PALPITATIONS  ? GERD (gastroesophageal reflux disease)   ? High cholesterol   ? Leg pain   ? PONV (postoperative nausea and vomiting)   ? ? ?Surgeries: Procedure(s): ?RIGHT TOTAL KNEE REVISION on 08/26/2021 ?  ?Consultants:  ? ?Discharged Condition: Improved ? ?Hospital Course: Samantha Curtis is an 56 y.o. female who was admitted 08/26/2021 for operative treatment ofLoosening of prosthesis of right total knee replacement ([place]). Patient has severe unremitting pain that affects sleep, daily activities, and work/hobbies. After pre-op clearance the patient was taken to the operating room on 08/26/2021 and underwent  Procedure(s): ?RIGHT TOTAL KNEE REVISION.   ? ?Patient was given perioperative antibiotics:  ?Anti-infectives (From admission, onward)  ? ? Start     Dose/Rate Route Frequency Ordered Stop  ? 08/26/21 1900  ceFAZolin (ANCEF) IVPB 2g/100 mL premix       ? 2 g ?200 mL/hr over 30 Minutes Intravenous Every 6 hours 08/26/21 1654 08/27/21 0036  ? 08/26/21 1000  ceFAZolin (ANCEF) IVPB 2g/100 mL premix       ? 2 g ?200 mL/hr over 30 Minutes Intravenous On call to O.R. 08/26/21 0957 08/26/21 1258  ? ?  ?  ? ?Patient was given sequential compression devices, early ambulation, and chemoprophylaxis to prevent DVT. ? ?Patient benefited maximally from hospital stay and there were no complications.   ? ?Recent vital signs: Patient Vitals for the past 24 hrs: ? BP Temp Temp src Pulse Resp SpO2  ?08/29/21 0722 (!) 141/79  98.3 ?F (36.8 ?C) Oral 75 16 98 %  ?08/28/21 2137 129/66 98.7 ?F (37.1 ?C) Oral 76 16 97 %  ?08/28/21 1558 (!) 142/70 98.2 ?F (36.8 ?C) Oral 74 17 99 %  ?08/28/21 0800 120/69 98.4 ?F (36.9 ?C) Oral 72 16 99 %  ?  ? ?Recent laboratory studies:  ?Recent Labs  ?  08/27/21 ?0614  ?WBC 12.2*  ?HGB 10.7*  ?HCT 33.6*  ?PLT 214  ?NA 139  ?K 4.4  ?CL 103  ?CO2 28  ?BUN 15  ?CREATININE 0.76  ?GLUCOSE 122*  ?CALCIUM 9.1  ? ? ? ?Discharge Medications:   ?Allergies as of 08/29/2021   ? ?   Reactions  ? Cyclobenzaprine Hives, Rash  ? Latex Other (See Comments)  ? Sores in mouth from dentist  ? Other Hives  ? ALL MUSCLE RELAXERS  per pt ?Except Robaxin  ? Tizanidine Hcl Hives  ? Levofloxacin Nausea Only  ? ?  ? ?  ?Medication List  ?  ? ?STOP taking these medications   ? ?gentamicin cream 0.1 % ?Commonly known as: GARAMYCIN ?  ?naproxen 500 MG tablet ?Commonly known as: NAPROSYN ?  ?oxyCODONE-acetaminophen 5-325 MG tablet ?Commonly known as: Percocet ?  ?pantoprazole 40 MG tablet ?Commonly known as: PROTONIX ?  ?PreviDent 5000 Enamel Protect 1.1-5 % Gel ?Generic drug: Sod Fluoride-Potassium Nitrate ?  ?sulfamethoxazole-trimethoprim 800-160 MG tablet ?Commonly known as: BACTRIM DS ?  ? ?  ? ?TAKE these medications   ? ?  acetaminophen 500 MG tablet ?Commonly known as: TYLENOL ?Take 1,000 mg by mouth every 6 (six) hours as needed for moderate pain. ?  ?Aspirin Low Dose 81 MG EC tablet ?Generic drug: aspirin ?Take 1 tablet (81 mg total) by mouth 2 (two) times daily. To be taken after surgery to prevent blood clots ?  ?atorvastatin 10 MG tablet ?Commonly known as: LIPITOR ?Take 1 tablet (10 mg total) by mouth daily. ?What changed: Another medication with the same name was removed. Continue taking this medication, and follow the directions you see here. ?  ?cetirizine 10 MG tablet ?Commonly known as: ZYRTEC ?Take 10 mg by mouth daily. ?  ?diclofenac Sodium 1 % Gel ?Commonly known as: VOLTAREN ?Apply 2 g topically daily as needed (pain). ?   ?docusate sodium 100 MG capsule ?Commonly known as: Colace ?Take 1 capsule (100 mg total) by mouth daily as needed. ?  ?famotidine 20 MG tablet ?Commonly known as: PEPCID ?Take 20 mg by mouth daily. ?  ?finasteride 1 MG tablet ?Commonly known as: PROPECIA ?Take 2 tablets (2 mg total) by mouth daily. ?  ?HYDROmorphone 2 MG tablet ?Commonly known as: Dilaudid ?Take 1 tablet (2 mg total) by mouth every 6 (six) hours as needed for severe pain. ?  ?methocarbamol 500 MG tablet ?Commonly known as: Robaxin ?Take 1 tablet (500 mg total) by mouth 2 (two) times daily as needed. To be taken after surgery ?  ?metoprolol tartrate 25 MG tablet ?Commonly known as: LOPRESSOR ?Take 1 tablet (25 mg total) by mouth daily with food. ?  ?ondansetron 4 MG tablet ?Commonly known as: Zofran ?Take 1 tablet (4 mg total) by mouth every 8 (eight) hours as needed for nausea or vomiting. ?  ?QuickVue At-Home Covid-19 Test Kit ?Generic drug: COVID-19 At Home Antigen Test ?Use as directed. ?What changed: Another medication with the same name was removed. Continue taking this medication, and follow the directions you see here. ?  ?Carestart COVID-19 Home Test Kit ?Generic drug: COVID-19 At Home Antigen Test ?Use as directed ?What changed: Another medication with the same name was removed. Continue taking this medication, and follow the directions you see here. ?  ? ?  ? ?  ?  ? ? ?  ?Durable Medical Equipment  ?(From admission, onward)  ?  ? ? ?  ? ?  Start     Ordered  ? 08/26/21 1716  DME Walker rolling  Once       ?Question Answer Comment  ?Walker: With 5 Inch Wheels   ?Patient needs a walker to treat with the following condition Status post left partial knee replacement   ?  ? 08/26/21 1715  ? 08/26/21 1716  DME 3 n 1  Once       ? 08/26/21 1715  ? 08/26/21 1716  DME Bedside commode  Once       ?Question:  Patient needs a bedside commode to treat with the following condition  Answer:  Status post left partial knee replacement  ? 08/26/21 1715   ? ?  ?  ? ?  ? ? ?Diagnostic Studies: DG Knee Right Port ? ?Result Date: 08/26/2021 ?CLINICAL DATA:  Postop right knee arthroplasty EXAM: PORTABLE RIGHT KNEE - 1-2 VIEW COMPARISON:  Portable exam 1614 hours compared to 07/12/2021 FINDINGS: Interval revision of RIGHT total knee arthroplasty. Osseous demineralization. No fracture, dislocation, or bone destruction. IMPRESSION: Interval revision of RIGHT knee arthroplasty. No acute abnormalities. Electronically Signed   By: Crist Infante.D.  On: 08/26/2021 16:34   ? ?Disposition: Discharge disposition: 01-Home or Self Care ? ? ? ? ? ? ? ? ? Follow-up Information   ? ? Leandrew Koyanagi, MD Follow up in 2 week(s).   ?Specialty: Orthopedic Surgery ?Why: For suture removal, For wound re-check ?Contact information: ?[address] ?[place] [address] ?[phone] ? ? ?  ?  ? ?  ?  ? ?  ? ? ? ?Signed: ?Aundra Dubin ?08/29/2021, 7:38 AM ? ? ? ? ?

## 2021-08-29 NOTE — Progress Notes (Signed)
Patient given discharge instructions and stated understanding. 

## 2021-08-30 ENCOUNTER — Encounter (HOSPITAL_COMMUNITY): Payer: Self-pay | Admitting: Orthopaedic Surgery

## 2021-08-30 NOTE — Therapy (Signed)
?OUTPATIENT PHYSICAL THERAPY LOWER EXTREMITY EVALUATION ? ? ?Patient Name: Samantha Curtis ?MRN: [id number] ?DOB:[birth year], 56 y.o., female ?Today's Date: 08/31/2021 ? ? PT End of Session - 08/31/21 1056   ? ? Visit Number 1   ? Date for PT Re-Evaluation 11/09/21   ? Authorization Type UMR- no visit limit   ? PT Start Time 1016   ? PT Stop Time 1058   ? PT Time Calculation (min) 42 min   ? Activity Tolerance Patient limited by pain   ? Behavior During Therapy [facility] for tasks assessed/performed   ? ?  ?  ? ?  ? ? ?Past Medical History:  ?Diagnosis Date  ? Arthritis   ? "knees" (07/04/2017)  ? Complication of anesthesia   ? Dysrhythmia   ? PALPITATIONS  ? GERD (gastroesophageal reflux disease)   ? High cholesterol   ? Leg pain   ? PONV (postoperative nausea and vomiting)   ? ?Past Surgical History:  ?Procedure Laterality Date  ? ENDOVENOUS ABLATION SAPHENOUS VEIN W/ LASER Left 2005  ? [facility]  ? JOINT REPLACEMENT    ? KNEE ARTHROSCOPY Right   ? SHOULDER ARTHROSCOPY Right   ? "spurs"  ? SHOULDER ARTHROSCOPY Right 06/24/2019  ? Procedure: RIGHT SHOULDER ARTHROSCOPY; CHONDROPLASTY AND DEBRIEDMENT;  Surgeon: Marcene Corning, MD;  Location: [facility];  Service: Orthopedics;  Laterality: Right;  ? TOTAL KNEE ARTHROPLASTY Bilateral 07/03/2017  ? Procedure: TOTAL KNEE BILATERAL;  Surgeon: Marcene Corning, MD;  Location: [facility];  Service: Orthopedics;  Laterality: Bilateral;  ? TOTAL KNEE REVISION Right 08/26/2021  ? Procedure: RIGHT TOTAL KNEE REVISION;  Surgeon: Tarry Kos, MD;  Location: [facility];  Service: Orthopedics;  Laterality: Right;  ? TUBAL LIGATION    ? VAGINAL HYSTERECTOMY    ? ?Patient Active Problem List  ? Diagnosis Date Noted  ? 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 Status post revision of total replacement of right knee 08/26/2021  ? Status post total right knee replacement 08/04/2021  ? Allergic rhinitis 04/27/2021  ? Body mass index (BMI) of 25.0 to 29.9 04/27/2021  ?  Cardiomegaly 04/27/2021  ? Cough variant asthma 04/27/2021  ? Gastroduodenitis 04/27/2021  ? Osteoarthritis of knee 04/27/2021  ? Pure hypercholesterolemia 04/27/2021  ? Restless legs 04/27/2021  ? Ventricular premature depolarization 04/27/2021  ? S/P TKR (total knee replacement), bilateral   ? Gastroesophageal reflux disease   ? Post-operative pain   ? Elevated blood pressure reading   ? Bilateral primary osteoarthritis of knee 07/03/2017  ? Chest pain 08/31/2014  ? Palpitations 08/31/2014  ? Lateral epicondylitis of right elbow 04/02/2013  ? ? ?PCP: Kirby Funk, MD ? ?REFERRING PROVIDER: Tarry Kos, MD ? ?REFERRING DIAG: satus post total rt knee replacement (revision) ? ?THERAPY DIAG:  ?Muscle weakness (generalized) - Plan: PT plan of care cert/re-cert ? ?Localized edema - Plan: PT plan of care cert/re-cert ? ?Other abnormalities of gait and mobility - Plan: PT plan of care cert/re-cert ? ?Acute pain of right knee - Plan: PT plan of care cert/re-cert ? ?ONSET DATE: 08/29/21-surgery for Rt TKA revision ? ?SUBJECTIVE:  ? ?SUBJECTIVE STATEMENT: ?Pt presents to PT 15 days s/p Rt TKA revision surgery.  Pt had bil TKA in 2019 with loosening of hardware on the Rt leading to surgery on 08/29/21.  Pt arrived ambulating with a standard cane and reports significant pain and limited sleep. ? ?PERTINENT HISTORY: ?Bil TKA 2019, Rt shoulder scope 06/2019, Rt total  knee revision 08/26/21 ? ?PAIN:  ?Are you having pain? Yes: NPRS scale: 10/10 ?Pain location: Rt knee ?Pain description: throbbing, intense  ?Aggravating factors: standing/walking, it hurts all the time now ?Relieving factors: ice, rest, elevation, over the counter pain meds  ? ?PRECAUTIONS: None ? ?WEIGHT BEARING RESTRICTIONS No ?Fatigues quickly with gait: <5 minutes now ? ?FALLS:  ?Has patient fallen in last 6 months? No, Number of falls: 0 ? ?LIVING ENVIRONMENT: ?Lives with: lives with their family ?Lives in: House/apartment ?Stairs: Yes; Internal: 20 steps; on  left going up ?Has following equipment at home: Single point cane ? ?OCCUPATION: Radiology tech ? ?PLOF: Independent ? ?PATIENT GOALS return to prior level of function.  Walk and stand without device, reduce pain.  ? ?OBJECTIVE:  ? ?PATIENT SURVEYS:  ?FOTO 37 (goal is 5167) ? ?COGNITION: ? Overall cognitive status: Within functional limits for tasks assessed    ?SENSATION: ?WFL ?POSTURE:  ?WNL ?PALPATION: ?Edema about the Rt knee, healing surgical incision, diffuse palpable tenderness.  ? ?LE ROM: ? ?Passive ROM Right ?08/31/2021 Left ?08/31/2021  ?Hip flexion    ?Hip extension    ?Hip abduction    ?Hip adduction    ?Hip internal rotation    ?Hip external rotation    ?Knee flexion 75 full  ?Knee extension -15 full  ?Ankle dorsiflexion    ?Ankle plantarflexion    ?Ankle inversion    ?Ankle eversion    ? (Blank rows = not tested)  Painful with all testing  ? ?LE MMT: ? ?MMT Right ?08/31/2021 Left ?08/31/2021  ?Hip flexion 3-/5 (SLR)   ?Hip extension    ?Hip abduction    ?Hip adduction    ?Hip internal rotation    ?Hip external rotation    ?Knee flexion 3+/5 full  ?Knee extension 3-/5 full  ?Ankle dorsiflexion 4+/5 full  ?Ankle plantarflexion  full  ?Ankle inversion  full  ?Ankle eversion  full  ? (Blank rows = not tested)  Too painful to test additional muscles. ?Poor quad set- gluteal compensation ? ?GAIT: ?Distance walked: 100 ft ?Assistive device utilized: Single point cane ?Level of assistance: Modified independence ?Comments: Antalgic gait, reduced step length and time spent on Rt LE ? ?TODAY'S TREATMENT: ?08/31/21: HEP established.  See below.  ?  Game Ready: 3 snowflakes, med compression x 15 min ? ?PATIENT EDUCATION:  ?Education details: Access Code: [email or web address] ?Person educated: Patient ?Education method: Explanation, Demonstration, and Handouts ?Education comprehension: verbalized understanding and returned demonstration ? ?HOME EXERCISE PROGRAM: ?Access Code: [email or web address] ?URL: [email or web address] ?Date:  08/31/2021 ?Prepared by: Tresa EndoKelly ? ?Exercises ?Supine Ankle Pumps - 3 x daily - 7 x weekly - 2 sets - 15 reps ?Supine Quadricep Sets - 3 x daily - 7 x weekly - 2 sets - 10 reps - 5 hold ?Supine Active Straight Leg Raise - 2 x daily - 7 x weekly - 1 sets - 10 reps ?Supine Short Arc Quad - 2 x daily - 7 x weekly - 3 sets - 15 reps ?Supine Heel Slides - 3 x daily - 7 x weekly - 1 sets - 10 reps ?Seated Long Arc Quad - 1 x daily - 7 x weekly - 3 sets - 15 reps ?Seated Knee Flexion Stretch - 1 x daily - 7 x weekly - 3 sets - 15 reps ? ?Patient Education ?Total Knee Replacement Handout ? ?ASSESSMENT: ? ?CLINICAL IMPRESSION: ?Patient is a 56 y.o. female  who was seen today for physical therapy evaluation and treatment s/p Rt total  knee replacement revision performed 08/29/21. Pt arrived with significant (10/10) Rt knee pain so assessment was limited today.  Pt with limited Rt knee ROM and LE strength consistent with 5 days s/p TKA revision surgery.  Pt ambulates with a standard cane with antalgia and slow mobility.  Incision is covered with post-surgical bandage and pt is wearing TED stockings.  Patient will benefit from skilled PT to address the below impairments and improve overall function.  ? ?OBJECTIVE IMPAIRMENTS Abnormal gait, decreased activity tolerance, decreased balance, decreased endurance, difficulty walking, decreased ROM, decreased strength, increased edema, impaired flexibility, and pain.  ? ?ACTIVITY LIMITATIONS cleaning, community activity, meal prep, occupation, and shopping.  ? ?PERSONAL FACTORS 1-2 comorbidities: TKA x 2  are also affecting patient's functional outcome.  ? ?REHAB POTENTIAL: Good ? ?CLINICAL DECISION MAKING: Stable/uncomplicated ? ?EVALUATION COMPLEXITY: Low ? ? ?GOALS: ?Goals reviewed with patient? Yes ? ?SHORT TERM GOALS: Target date: 10/05/2021 ? ?Be independent in initial HEP ?Baseline:  ?Goal status: INITIAL ? ?2.  Report < or = to 5/10 Rt knee pain with standing and walking to improve  independence at home ?Baseline: 10/10 ?Goal status: INITIAL ? ?3.  Demonstrate Rt knee A/ROM flexion to > or = to 95 degrees to improve sitting and steps with less compensation ?Baseline: 75 ?Goal status:

## 2021-08-31 ENCOUNTER — Ambulatory Visit: Payer: No Typology Code available for payment source | Attending: Orthopaedic Surgery

## 2021-08-31 ENCOUNTER — Other Ambulatory Visit: Payer: Self-pay

## 2021-08-31 DIAGNOSIS — Z96651 Presence of right artificial knee joint: Secondary | ICD-10-CM | POA: Insufficient documentation

## 2021-08-31 DIAGNOSIS — M6281 Muscle weakness (generalized): Secondary | ICD-10-CM | POA: Insufficient documentation

## 2021-08-31 DIAGNOSIS — R600 Localized edema: Secondary | ICD-10-CM | POA: Diagnosis present

## 2021-08-31 DIAGNOSIS — R2689 Other abnormalities of gait and mobility: Secondary | ICD-10-CM | POA: Diagnosis present

## 2021-08-31 DIAGNOSIS — M25561 Pain in right knee: Secondary | ICD-10-CM | POA: Diagnosis present

## 2021-08-31 LAB — AEROBIC/ANAEROBIC CULTURE W GRAM STAIN (SURGICAL/DEEP WOUND)
Culture: NO GROWTH
Culture: NO GROWTH
Gram Stain: NONE SEEN

## 2021-08-31 NOTE — Anesthesia Postprocedure Evaluation (Signed)
Anesthesia Post Note ? ?Patient: Samantha Curtis ? ?Procedure(s) Performed: RIGHT TOTAL KNEE REVISION (Right: Knee) ? ?  ? ?Patient location during evaluation: PACU ?Anesthesia Type: Regional, Spinal and MAC ?Level of consciousness: awake and alert ?Pain management: pain level controlled ?Vital Signs Assessment: post-procedure vital signs reviewed and stable ?Respiratory status: spontaneous breathing, nonlabored ventilation, respiratory function stable and patient connected to nasal cannula oxygen ?Cardiovascular status: stable and blood pressure returned to baseline ?Postop Assessment: no apparent nausea or vomiting ?Anesthetic complications: no ? ? ?No notable events documented. ? ?Last Vitals:  ?Vitals:  ? 08/28/21 2137 08/29/21 0722  ?BP: 129/66 (!) 141/79  ?Pulse: 76 75  ?Resp: 16 16  ?Temp: 37.1 ?C 36.8 ?C  ?SpO2: 97% 98%  ?  ?Last Pain:  ?Vitals:  ? 08/29/21 0748  ?TempSrc:   ?PainSc: 4   ? ? ?  ?  ?  ?  ?  ?  ? ?Jeanpierre Thebeau ? ? ? ? ?

## 2021-09-02 ENCOUNTER — Encounter: Payer: Self-pay | Admitting: Physical Therapy

## 2021-09-02 ENCOUNTER — Other Ambulatory Visit: Payer: Self-pay

## 2021-09-02 ENCOUNTER — Ambulatory Visit: Payer: No Typology Code available for payment source | Admitting: Physical Therapy

## 2021-09-02 DIAGNOSIS — R600 Localized edema: Secondary | ICD-10-CM

## 2021-09-02 DIAGNOSIS — M6281 Muscle weakness (generalized): Secondary | ICD-10-CM | POA: Diagnosis not present

## 2021-09-02 DIAGNOSIS — R2689 Other abnormalities of gait and mobility: Secondary | ICD-10-CM

## 2021-09-02 DIAGNOSIS — M25561 Pain in right knee: Secondary | ICD-10-CM

## 2021-09-02 NOTE — Therapy (Signed)
?OUTPATIENT PHYSICAL THERAPY TREATMENT NOTE ? ? ?Patient Name: Samantha Curtis ?MRN: [id number] ?DOB:May 14, 1966, 56 y.o., female ?Today's Date: 09/02/2021 ? ?PCP: Kirby FunkGriffin, John, MD ?REFERRING PROVIDER: Kirby FunkGriffin, John, MD ? ? ? ?Past Medical History:  ?Diagnosis Date  ? Arthritis   ? "knees" (07/04/2017)  ? Complication of anesthesia   ? Dysrhythmia   ? PALPITATIONS  ? GERD (gastroesophageal reflux disease)   ? High cholesterol   ? Leg pain   ? PONV (postoperative nausea and vomiting)   ? ?Past Surgical History:  ?Procedure Laterality Date  ? ENDOVENOUS ABLATION SAPHENOUS VEIN W/ LASER Left 2005  ? [facility]  ? JOINT REPLACEMENT    ? KNEE ARTHROSCOPY Right   ? SHOULDER ARTHROSCOPY Right   ? "spurs"  ? SHOULDER ARTHROSCOPY Right 06/24/2019  ? Procedure: RIGHT SHOULDER ARTHROSCOPY; CHONDROPLASTY AND DEBRIEDMENT;  Surgeon: Marcene Corningalldorf, Peter, MD;  Location: [facility];  Service: Orthopedics;  Laterality: Right;  ? TOTAL KNEE ARTHROPLASTY Bilateral 07/03/2017  ? Procedure: TOTAL KNEE BILATERAL;  Surgeon: Marcene Corningalldorf, Peter, MD;  Location: [facility];  Service: Orthopedics;  Laterality: Bilateral;  ? TOTAL KNEE REVISION Right 08/26/2021  ? Procedure: RIGHT TOTAL KNEE REVISION;  Surgeon: Tarry KosXu, Naiping M, MD;  Location: [facility];  Service: Orthopedics;  Laterality: Right;  ? TUBAL LIGATION    ? VAGINAL HYSTERECTOMY    ? ?Patient Active Problem List  ? Diagnosis Date Noted  ? 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 Status post revision of total replacement of right knee 08/26/2021  ? Status post total right knee replacement 08/04/2021  ? Allergic rhinitis 04/27/2021  ? Body mass index (BMI) of 25.0 to 29.9 04/27/2021  ? Cardiomegaly 04/27/2021  ? Cough variant asthma 04/27/2021  ? Gastroduodenitis 04/27/2021  ? Osteoarthritis of knee 04/27/2021  ? Pure hypercholesterolemia 04/27/2021  ? Restless legs 04/27/2021  ? Ventricular premature depolarization 04/27/2021  ? S/P TKR (total knee replacement), bilateral   ?  Gastroesophageal reflux disease   ? Post-operative pain   ? Elevated blood pressure reading   ? Bilateral primary osteoarthritis of knee 07/03/2017  ? Chest pain 08/31/2014  ? Palpitations 08/31/2014  ? Lateral epicondylitis of right elbow 04/02/2013  ? ? ?REFERRING DIAG: satus post total rt knee replacement (revision) ? ?THERAPY DIAG:  ?Muscle weakness (generalized) ? ?Localized edema ? ?Other abnormalities of gait and mobility ? ?Acute pain of right knee ? ?PERTINENT HISTORY: Bil TKA 2019, Rt shoulder scope 06/2019, Rt total knee revision 08/26/21 ? ?PRECAUTIONS: None ? ?SUBJECTIVE: Walked a lot yesterday but paid for it at night time. My bandage is really pulling on my skin.  ? ?PAIN:  ?Are you having pain? Yes: NPRS scale: 6/10 ?Pain location: RT knee ?Pain description: Sore ?Aggravating factors: A lot of weightbearing ?Relieving factors: Ice. Meds maybe ? ? ? ?OBJECTIVE:  ?  ?PATIENT SURVEYS:  ?FOTO 37 (goal is 3167) ?  ?COGNITION: ?          Overall cognitive status: Within functional limits for tasks assessed                          ?SENSATION: ?WFL ?POSTURE:  ?WNL ?PALPATION: ?Edema about the Rt knee, healing surgical incision, diffuse palpable tenderness.  ?  ?LE ROM: ?  ?Passive ROM Right ?08/31/2021 Left ?08/31/2021  ?Hip flexion      ?Hip extension      ?Hip abduction      ?Hip adduction      ?  Hip internal rotation      ?Hip external rotation      ?Knee flexion 75 full  ?Knee extension -15 full  ?Ankle dorsiflexion      ?Ankle plantarflexion      ?Ankle inversion      ?Ankle eversion      ? (Blank rows = not tested)  Painful with all testing  ?  ?LE MMT: ?  ?MMT Right ?08/31/2021 Left ?08/31/2021  ?Hip flexion 3-/5 (SLR)    ?Hip extension      ?Hip abduction      ?Hip adduction      ?Hip internal rotation      ?Hip external rotation      ?Knee flexion 3+/5 full  ?Knee extension 3-/5 full  ?Ankle dorsiflexion 4+/5 full  ?Ankle plantarflexion   full  ?Ankle inversion   full  ?Ankle eversion   full  ? (Blank rows  = not tested)  Too painful to test additional muscles. ?Poor quad set- gluteal compensation ?  ?GAIT: ?Distance walked: 100 ft ?Assistive device utilized: Single point cane ?Level of assistance: Modified independence ?Comments: Antalgic gait, reduced step length and time spent on Rt LE ?  ?TODAY'S TREATMENT: ? ?09/02/21: ?Nustep: L1  x 10 min ?SAQ 0# 2x10, some pain ?Heel slides with floor slider under heel 2x20: some pain ?Game Ready x 15 min with medium compression 3 flakes. ? ?08/31/21: HEP established.  See below.  ?                       Game Ready: 3 snowflakes, med compression x 15 min ?  ?PATIENT EDUCATION:  ?Education details: Access Code: [email or web address] ?Person educated: Patient ?Education method: Explanation, Demonstration, and Handouts ?Education comprehension: verbalized understanding and returned demonstration ?  ?HOME EXERCISE PROGRAM: ?Access Code: [email or web address] ?URL: [email or web address] ?Date: 08/31/2021 ?Prepared by: Tresa Endo ?  ?Exercises ?Supine Ankle Pumps - 3 x daily - 7 x weekly - 2 sets - 15 reps ?Supine Quadricep Sets - 3 x daily - 7 x weekly - 2 sets - 10 reps - 5 hold ?Supine Active Straight Leg Raise - 2 x daily - 7 x weekly - 1 sets - 10 reps ?Supine Short Arc Quad - 2 x daily - 7 x weekly - 3 sets - 15 reps ?Supine Heel Slides - 3 x daily - 7 x weekly - 1 sets - 10 reps ?Seated Long Arc Quad - 1 x daily - 7 x weekly - 3 sets - 15 reps ?Seated Knee Flexion Stretch - 1 x daily - 7 x weekly - 3 sets - 15 reps ?  ?Patient Education ?Total Knee Replacement Handout ?  ?ASSESSMENT: ?  ?CLINICAL IMPRESSION: ?Pt arrives with high levels of pain today, often teary during exercises. Pt reports the adhesive from her bandage is really pulling on her skin and is contributing to her discomfort. Bandage is set to come off next week.  ?  ?OBJECTIVE IMPAIRMENTS Abnormal gait, decreased activity tolerance, decreased balance, decreased endurance, difficulty walking, decreased ROM, decreased strength,  increased edema, impaired flexibility, and pain.  ?  ?ACTIVITY LIMITATIONS cleaning, community activity, meal prep, occupation, and shopping.  ?  ?PERSONAL FACTORS 1-2 comorbidities: TKA x 2  are also affecting patient's functional outcome.  ?  ?REHAB POTENTIAL: Good ?  ?CLINICAL DECISION MAKING: Stable/uncomplicated ?  ?EVALUATION COMPLEXITY: Low ?  ?  ?GOALS: ?Goals reviewed with patient? Yes ?  ?SHORT TERM GOALS: Target date: 10/05/2021 ?  ?  Be independent in initial HEP ?Baseline:  ?Goal status: INITIAL ?  ?2.  Report < or = to 5/10 Rt knee pain with standing and walking to improve independence at home ?Baseline: 10/10 ?Goal status: INITIAL ?  ?3.  Demonstrate Rt knee A/ROM flexion to > or = to 95 degrees to improve sitting and steps with less compensation ?Baseline: 75 ?Goal status: INITIAL ?  ?4.  Demonstrate Rt knee A/ROM extension to lacking < or  = to 8 degrees to normalize gait pattern ?Baseline: 15 ?Goal status: INITIAL ?  ?5.  Tolerate standing and walking > or = to 10-15 minutes for home tasks  ?Baseline: 5 ?Goal status: INITIAL ?  ?  ?LONG TERM GOALS: Target date: 11/09/2021 ?  ?Be independent in advanced HEP ?Baseline:  ?Goal status: INITIAL ?  ?2.  Improve FOTO to > or = to 67 ?Baseline: 37 ?Goal status: INITIAL ?  ?3.  Demonstrate > or = to 110 degrees Rt knee flexion to improve ability to squat and negotiate steps  ?Baseline: 75 ?Goal status: INITIAL ?  ?4.  Demonstrate symmetry with ambulation on level surface with or without cane  ?Baseline:  ?Goal status: INITIAL ?  ?5.  Demonstrate 4+/5 Rt knee strength to improve endurance and independence with negotiating steps at home ?Baseline:  ?Goal status: INITIAL ?  ?6.  Reduce Rt knee pain to allow to stand and walk > or = to 30 min to 1 hour to facilitate return to work  ?Baseline: 5 min  ?Goal status: INITIAL ?  ?  ?PLAN: ?PT FREQUENCY: 2x/week ?  ?PT DURATION: 10 weeks ?  ?PLANNED INTERVENTIONS: Therapeutic exercises, Therapeutic activity,  Neuromuscular re-education, Balance training, Gait training, Patient/Family education, Joint mobilization, Stair training, Dry Needling, Cryotherapy, Moist heat, Taping, Vasopneumatic device, and Manual therapy ?  ?P

## 2021-09-05 ENCOUNTER — Other Ambulatory Visit: Payer: Self-pay

## 2021-09-05 ENCOUNTER — Encounter: Payer: Self-pay | Admitting: Physical Therapy

## 2021-09-05 ENCOUNTER — Ambulatory Visit: Payer: No Typology Code available for payment source | Admitting: Physical Therapy

## 2021-09-05 DIAGNOSIS — M25561 Pain in right knee: Secondary | ICD-10-CM

## 2021-09-05 DIAGNOSIS — M6281 Muscle weakness (generalized): Secondary | ICD-10-CM

## 2021-09-05 DIAGNOSIS — R600 Localized edema: Secondary | ICD-10-CM

## 2021-09-05 DIAGNOSIS — R2689 Other abnormalities of gait and mobility: Secondary | ICD-10-CM

## 2021-09-05 NOTE — Therapy (Signed)
?OUTPATIENT PHYSICAL THERAPY TREATMENT NOTE ? ? ?Patient Name: Samantha Curtis ?MRN: [id number] ?DOB:1965/08/19, 56 y.o., female ?Today's Date: 09/05/2021 ? ?PCP: Lavone Orn, MD ?REFERRING PROVIDER: Lavone Orn, MD ? ? PT End of Session - 09/05/21 1358   ? ? Visit Number 3   ? Date for PT Re-Evaluation 11/09/21   ? Authorization Type UMR- no visit limit   ? PT Start Time 1358   ? PT Stop Time 1455   ? PT Time Calculation (min) 57 min   ? Activity Tolerance Patient limited by pain;Patient tolerated treatment well   ? Behavior During Therapy [facility] for tasks assessed/performed   ? ?  ?  ? ?  ? ? ?Past Medical History:  ?Diagnosis Date  ? Arthritis   ? "knees" (07/04/2017)  ? Complication of anesthesia   ? Dysrhythmia   ? PALPITATIONS  ? GERD (gastroesophageal reflux disease)   ? High cholesterol   ? Leg pain   ? PONV (postoperative nausea and vomiting)   ? ?Past Surgical History:  ?Procedure Laterality Date  ? ENDOVENOUS ABLATION SAPHENOUS VEIN W/ LASER Left 2005  ? [facility]  ? JOINT REPLACEMENT    ? KNEE ARTHROSCOPY Right   ? SHOULDER ARTHROSCOPY Right   ? "spurs"  ? SHOULDER ARTHROSCOPY Right 06/24/2019  ? Procedure: RIGHT SHOULDER ARTHROSCOPY; CHONDROPLASTY AND DEBRIEDMENT;  Surgeon: Melrose Nakayama, MD;  Location: [facility];  Service: Orthopedics;  Laterality: Right;  ? TOTAL KNEE ARTHROPLASTY Bilateral 07/03/2017  ? Procedure: TOTAL KNEE BILATERAL;  Surgeon: Melrose Nakayama, MD;  Location: [place];  Service: Orthopedics;  Laterality: Bilateral;  ? TOTAL KNEE REVISION Right 08/26/2021  ? Procedure: RIGHT TOTAL KNEE REVISION;  Surgeon: Leandrew Koyanagi, MD;  Location: [place];  Service: Orthopedics;  Laterality: Right;  ? TUBAL LIGATION    ? VAGINAL HYSTERECTOMY    ? ?Patient Active Problem List  ? Diagnosis Date Noted  ? Loosening of prosthesis of right total knee replacement ([place]) 08/26/2021  ? Status post revision of total replacement of right knee 08/26/2021  ? Status post total right knee replacement 08/04/2021  ?  Allergic rhinitis 04/27/2021  ? Body mass index (BMI) of 25.0 to 29.9 04/27/2021  ? Cardiomegaly 04/27/2021  ? Cough variant asthma 04/27/2021  ? Gastroduodenitis 04/27/2021  ? Osteoarthritis of knee 04/27/2021  ? Pure hypercholesterolemia 04/27/2021  ? Restless legs 04/27/2021  ? Ventricular premature depolarization 04/27/2021  ? S/P TKR (total knee replacement), bilateral   ? Gastroesophageal reflux disease   ? Post-operative pain   ? Elevated blood pressure reading   ? Bilateral primary osteoarthritis of knee 07/03/2017  ? Chest pain 08/31/2014  ? Palpitations 08/31/2014  ? Lateral epicondylitis of right elbow 04/02/2013  ? ? ?REFERRING DIAG: satus post total rt knee replacement (revision) ? ?THERAPY DIAG:  ?Muscle weakness (generalized) ? ?Localized edema ? ?Other abnormalities of gait and mobility ? ?Acute pain of right knee ? ?PERTINENT HISTORY: Bil TKA 2019, Rt shoulder scope 06/2019, Rt total knee revision 08/26/21 ? ?PRECAUTIONS: None ? ?SUBJECTIVE: Night time is the worse. I wake up a lot.  ?PAIN:  ?Are you having pain? Yes: NPRS scale: 6/10 ?Pain location: RT knee ?Pain description: Sore ?Aggravating factors: A lot of weightbearing ?Relieving factors: Ice. Meds maybe ? ? ? ?OBJECTIVE:  ?  ?PATIENT SURVEYS:  ?FOTO 37 (goal is 42) ?  ?COGNITION: ?          Overall cognitive status: Within functional limits for tasks assessed                          ?  SENSATION: ?WFL ?POSTURE:  ?WNL ?PALPATION: ?Edema about the Rt knee, healing surgical incision, diffuse palpable tenderness.  ?  ?LE ROM: ?  ?Passive ROM Right ?08/31/2021 Left ?08/31/2021 Right ?09/05/21  ?Hip flexion       ?Hip extension       ?Hip abduction       ?Hip adduction       ?Hip internal rotation       ?Hip external rotation       ?Knee flexion 75 full 86  ?Knee extension -15 full 8  ?Ankle dorsiflexion       ?Ankle plantarflexion       ?Ankle inversion       ?Ankle eversion       ? (Blank rows = not tested)  Painful with all testing  ?  ?LE MMT: ?   ?MMT Right ?08/31/2021 Left ?08/31/2021  ?Hip flexion 3-/5 (SLR)    ?Hip extension      ?Hip abduction      ?Hip adduction      ?Hip internal rotation      ?Hip external rotation      ?Knee flexion 3+/5 full  ?Knee extension 3-/5 full  ?Ankle dorsiflexion 4+/5 full  ?Ankle plantarflexion   full  ?Ankle inversion   full  ?Ankle eversion   full  ? (Blank rows = not tested)  Too painful to test additional muscles. ?Poor quad set- gluteal compensation ?  ?GAIT: ?Distance walked: 100 ft ?Assistive device utilized: Single point cane ?Level of assistance: Modified independence ?Comments: Antalgic gait, reduced step length and time spent on Rt LE ?  ?TODAY'S TREATMENT: ? ?09/05/21: ?Nustep L1 x10 min ?LAQ 0# 2x10  ?Standing heel raises 10x Vc for contract Rt quad ?Standing hip abduction Bil 10x ?Standing RT gastroc stretch 4x 10 sec ?Heel slides 20x supine  ?MANUAL: Hamstring soft tissue work/gentle extension stretching ?Game Ready x 15 min with medium compression 3 flakes ? ?09/02/21: ?Nustep: L1  x 10 min ?SAQ 0# 2x10, some pain ?Heel slides with floor slider under heel 2x20: some pain ?Game Ready x 15 min with medium compression 3 flakes. ? ?08/31/21: HEP established.  See below.  ?                       Game Ready: 3 snowflakes, med compression x 15 min ?  ?PATIENT EDUCATION:  ?Education details: Access Code: [email or web address] ?Person educated: Patient ?Education method: Explanation, Demonstration, and Handouts ?Education comprehension: verbalized understanding and returned demonstration ?  ?HOME EXERCISE PROGRAM: ?Access Code: [email or web address] ?URL: [email or web address] ?Date: 08/31/2021 ?Prepared by: Claiborne Billings ?  ?Exercises ?Supine Ankle Pumps - 3 x daily - 7 x weekly - 2 sets - 15 reps ?Supine Quadricep Sets - 3 x daily - 7 x weekly - 2 sets - 10 reps - 5 hold ?Supine Active Straight Leg Raise - 2 x daily - 7 x weekly - 1 sets - 10 reps ?Supine Short Arc Quad - 2 x daily - 7 x weekly - 3 sets - 15 reps ?Supine Heel Slides - 3  x daily - 7 x weekly - 1 sets - 10 reps ?Seated Long Arc Quad - 1 x daily - 7 x weekly - 3 sets - 15 reps ?Seated Knee Flexion Stretch - 1 x daily - 7 x weekly - 3 sets - 15 reps ?  ?Patient Education ?Total Knee Replacement Handout ?  ?ASSESSMENT: ?  ?CLINICAL IMPRESSION: ?Pain subjectively better although pt gives  same pain number. Pt reports quad and knee pain with all exercises. AROM has improved in both directions.  ?  ?OBJECTIVE IMPAIRMENTS Abnormal gait, decreased activity tolerance, decreased balance, decreased endurance, difficulty walking, decreased ROM, decreased strength, increased edema, impaired flexibility, and pain.  ?  ?ACTIVITY LIMITATIONS cleaning, community activity, meal prep, occupation, and shopping.  ?  ?PERSONAL FACTORS 1-2 comorbidities: TKA x 2  are also affecting patient's functional outcome.  ?  ?REHAB POTENTIAL: Good ?  ?CLINICAL DECISION MAKING: Stable/uncomplicated ?  ?EVALUATION COMPLEXITY: Low ?  ?  ?GOALS: ?Goals reviewed with patient? Yes ?  ?SHORT TERM GOALS: Target date: 10/05/2021 ?  ?Be independent in initial HEP ?Baseline:  ?Goal status: Met 09/05/21 ?  ?2.  Report < or = to 5/10 Rt knee pain with standing and walking to improve independence at home ?Baseline: 10/10 ?Goal status: INITIAL ?  ?3.  Demonstrate Rt knee A/ROM flexion to > or = to 95 degrees to improve sitting and steps with less compensation ?Baseline: 75 ?Goal status: INITIAL ?  ?4.  Demonstrate Rt knee A/ROM extension to lacking < or  = to 8 degrees to normalize gait pattern ?Baseline: 15 ?Goal status: Met 09/05/21  ?5.  Tolerate standing and walking > or = to 10-15 minutes for home tasks  ?Baseline: 5 ?Goal status: INITIAL ?  ?  ?LONG TERM GOALS: Target date: 11/09/2021 ?  ?Be independent in advanced HEP ?Baseline:  ?Goal status: INITIAL ?  ?2.  Improve FOTO to > or = to 67 ?Baseline: 37 ?Goal status: INITIAL ?  ?3.  Demonstrate > or = to 110 degrees Rt knee flexion to improve ability to squat and negotiate steps   ?Baseline: 75 ?Goal status: INITIAL ?  ?4.  Demonstrate symmetry with ambulation on level surface with or without cane  ?Baseline:  ?Goal status: INITIAL ?  ?5.  Demonstrate 4+/5 Rt knee strength to improve

## 2021-09-07 ENCOUNTER — Ambulatory Visit: Payer: No Typology Code available for payment source

## 2021-09-07 ENCOUNTER — Other Ambulatory Visit: Payer: Self-pay | Admitting: Physician Assistant

## 2021-09-07 ENCOUNTER — Other Ambulatory Visit: Payer: Self-pay

## 2021-09-07 ENCOUNTER — Other Ambulatory Visit (HOSPITAL_COMMUNITY): Payer: Self-pay

## 2021-09-07 DIAGNOSIS — R600 Localized edema: Secondary | ICD-10-CM

## 2021-09-07 DIAGNOSIS — R2689 Other abnormalities of gait and mobility: Secondary | ICD-10-CM

## 2021-09-07 DIAGNOSIS — M25561 Pain in right knee: Secondary | ICD-10-CM

## 2021-09-07 DIAGNOSIS — M6281 Muscle weakness (generalized): Secondary | ICD-10-CM

## 2021-09-07 NOTE — Therapy (Signed)
?OUTPATIENT PHYSICAL THERAPY TREATMENT NOTE ? ? ?Patient Name: Samantha Curtis ?MRN: [id number] ?DOB:1966/04/02, 56 y.o., female ?Today's Date: 09/07/2021 ? ?PCP: Lavone Orn, MD ?REFERRING PROVIDER: Leandrew Koyanagi, MD ? ? PT End of Session - 09/07/21 1056   ? ? Visit Number 4   ? Date for PT Re-Evaluation 11/09/21   ? Authorization Type UMR- no visit limit   ? PT Start Time 1017   ? PT Stop Time 1110   ? PT Time Calculation (min) 53 min   ? Activity Tolerance Patient tolerated treatment well   ? Behavior During Therapy [facility] for tasks assessed/performed   ? ?  ?  ? ?  ? ? ? ?Past Medical History:  ?Diagnosis Date  ? Arthritis   ? "knees" (07/04/2017)  ? Complication of anesthesia   ? Dysrhythmia   ? PALPITATIONS  ? GERD (gastroesophageal reflux disease)   ? High cholesterol   ? Leg pain   ? PONV (postoperative nausea and vomiting)   ? ?Past Surgical History:  ?Procedure Laterality Date  ? ENDOVENOUS ABLATION SAPHENOUS VEIN W/ LASER Left 2005  ? [facility]  ? JOINT REPLACEMENT    ? KNEE ARTHROSCOPY Right   ? SHOULDER ARTHROSCOPY Right   ? "spurs"  ? SHOULDER ARTHROSCOPY Right 06/24/2019  ? Procedure: RIGHT SHOULDER ARTHROSCOPY; CHONDROPLASTY AND DEBRIEDMENT;  Surgeon: Melrose Nakayama, MD;  Location: [facility];  Service: Orthopedics;  Laterality: Right;  ? TOTAL KNEE ARTHROPLASTY Bilateral 07/03/2017  ? Procedure: TOTAL KNEE BILATERAL;  Surgeon: Melrose Nakayama, MD;  Location: [place];  Service: Orthopedics;  Laterality: Bilateral;  ? TOTAL KNEE REVISION Right 08/26/2021  ? Procedure: RIGHT TOTAL KNEE REVISION;  Surgeon: Leandrew Koyanagi, MD;  Location: [place];  Service: Orthopedics;  Laterality: Right;  ? TUBAL LIGATION    ? VAGINAL HYSTERECTOMY    ? ?Patient Active Problem List  ? Diagnosis Date Noted  ? Loosening of prosthesis of right total knee replacement ([place]) 08/26/2021  ? Status post revision of total replacement of right knee 08/26/2021  ? Status post total right knee replacement 08/04/2021  ? Allergic rhinitis  04/27/2021  ? Body mass index (BMI) of 25.0 to 29.9 04/27/2021  ? Cardiomegaly 04/27/2021  ? Cough variant asthma 04/27/2021  ? Gastroduodenitis 04/27/2021  ? Osteoarthritis of knee 04/27/2021  ? Pure hypercholesterolemia 04/27/2021  ? Restless legs 04/27/2021  ? Ventricular premature depolarization 04/27/2021  ? S/P TKR (total knee replacement), bilateral   ? Gastroesophageal reflux disease   ? Post-operative pain   ? Elevated blood pressure reading   ? Bilateral primary osteoarthritis of knee 07/03/2017  ? Chest pain 08/31/2014  ? Palpitations 08/31/2014  ? Lateral epicondylitis of right elbow 04/02/2013  ? ? ?REFERRING DIAG: satus post total rt knee replacement (revision) ? ?THERAPY DIAG:  ?Muscle weakness (generalized) ? ?Localized edema ? ?Other abnormalities of gait and mobility ? ?Acute pain of right knee ? ?PERTINENT HISTORY: Bil TKA 2019, Rt shoulder scope 06/2019, Rt total knee revision 08/26/21 ? ?PRECAUTIONS: None ? ?SUBJECTIVE: I am a little more sore today. I am worried that the pain won't go away.   ?PAIN:  ?Are you having pain? Yes: NPRS scale: 7/10 ?Pain location: RT knee ?Pain description: Sore ?Aggravating factors: A lot of weightbearing ?Relieving factors: Ice. Meds maybe ? ?OBJECTIVE:  ?  ?PATIENT SURVEYS:  ?FOTO 37 (goal is 55) ? ?PALPATION: ?Edema about the Rt knee, healing surgical incision, diffuse palpable tenderness.  ?  ?LE ROM: ?  ?Passive  ROM Right ?08/31/2021 Left ?08/31/2021 Right ?09/05/21  ?Hip flexion       ?Hip extension       ?Hip abduction       ?Hip adduction       ?Hip internal rotation       ?Hip external rotation       ?Knee flexion 75 full 86  ?Knee extension -15 full 8  ?Ankle dorsiflexion       ?Ankle plantarflexion       ?Ankle inversion       ?Ankle eversion       ? (Blank rows = not tested)  Painful with all testing  ?  ?LE MMT: ?  ?MMT Right ?08/31/2021 Left ?08/31/2021  ?Hip flexion 3-/5 (SLR)    ?Hip extension      ?Hip abduction      ?Hip adduction      ?Hip internal  rotation      ?Hip external rotation      ?Knee flexion 3+/5 full  ?Knee extension 3-/5 full  ?Ankle dorsiflexion 4+/5 full  ?Ankle plantarflexion   full  ?Ankle inversion   full  ?Ankle eversion   full  ? (Blank rows = not tested)  Too painful to test additional muscles. ?Poor quad set- gluteal compensation ?  ?GAIT: ?Distance walked: 100 ft ?Assistive device utilized: Single point cane ?Level of assistance: Modified independence ?Comments: Antalgic gait, reduced step length and time spent on Rt LE ?  ?TODAY'S TREATMENT: ?09/07/21: ?Nustep L5 x10 min- PT present to discuss progress. ?Standing rockerboardx 3 minutes  ?Seated hamstring stretch 3x20 seconds  ?LAQ 0# 2x10  ?Standing heel raises 10x Vc for contract Rt quad ?Weightshifting on balance pad x 1 minute ?Heel raises 2x10 ?Sit to stand: 2x10 ?Heel slides 20x seated with slider under foot  ?MANUAL: Hamstring soft tissue work/gentle extension stretching ?Game Ready x 15 min with medium compression 3 flakes ? ? ?09/05/21: ?Nustep L1 x10 min ?LAQ 0# 2x10  ?Standing heel raises 10x Vc for contract Rt quad ?Standing hip abduction Bil 10x ?Standing RT gastroc stretch 4x 10 sec ?Heel slides 20x supine  ?MANUAL: Hamstring soft tissue work/gentle extension stretching ?Game Ready x 15 min with medium compression 3 flakes ? ?09/02/21: ?Nustep: L1  x 10 min ?SAQ 0# 2x10, some pain ?Heel slides with floor slider under heel 2x20: some pain ?Game Ready x 15 min with medium compression 3 flakes. ? ?  ?PATIENT EDUCATION:  ?Education details: Access Code: [email or web address] ?Person educated: Patient ?Education method: Explanation, Demonstration, and Handouts ?Education comprehension: verbalized understanding and returned demonstration ?  ?HOME EXERCISE PROGRAM: ?Access Code: [email or web address] ?URL: [email or web address] ?Date: 08/31/2021 ?Prepared by: Claiborne Billings ?  ?Exercises ?Supine Ankle Pumps - 3 x daily - 7 x weekly - 2 sets - 15 reps ?Supine Quadricep Sets - 3 x daily - 7 x weekly - 2  sets - 10 reps - 5 hold ?Supine Active Straight Leg Raise - 2 x daily - 7 x weekly - 1 sets - 10 reps ?Supine Short Arc Quad - 2 x daily - 7 x weekly - 3 sets - 15 reps ?Supine Heel Slides - 3 x daily - 7 x weekly - 1 sets - 10 reps ?Seated Long Arc Quad - 1 x daily - 7 x weekly - 3 sets - 15 reps ?Seated Knee Flexion Stretch - 1 x daily - 7 x weekly - 3 sets - 15 reps ?  ?Patient Education ?Total Knee Replacement Handout ?  ?  ASSESSMENT: ?  ?CLINICAL IMPRESSION: ?Pt is making expected progress s/p TKA revision.  Pt reports continued high pain levels that are impacting her sleep.  Pt was able to complete all exercises in the clinic with some increased pain.  Rt knee A/ROM is improved this week with 8 degrees extension and 86 flexion last visit.  Pt requires monitoring for pain throughout.  Pt will continue to benefit from skilled PT to address strength, gait, flexibility and edema management s/p TKA.   ? ?OBJECTIVE IMPAIRMENTS Abnormal gait, decreased activity tolerance, decreased balance, decreased endurance, difficulty walking, decreased ROM, decreased strength, increased edema, impaired flexibility, and pain.  ?   ?  ?GOALS: ?Goals reviewed with patient? Yes ?  ?SHORT TERM GOALS: Target date: 10/05/2021 ?  ?Be independent in initial HEP ?Baseline:  ?Goal status: Met 09/05/21 ?  ?2.  Report < or = to 5/10 Rt knee pain with standing and walking to improve independence at home ?Baseline: 7/10 (09/07/21) ?Goal status: in progress ?  ?3.  Demonstrate Rt knee A/ROM flexion to > or = to 95 degrees to improve sitting and steps with less compensation ?Baseline: 75 ?Goal status: INITIAL ?  ?4.  Demonstrate Rt knee A/ROM extension to lacking < or  = to 8 degrees to normalize gait pattern ?Baseline: 15 ?Goal status: Met 09/05/21  ?5.  Tolerate standing and walking > or = to 10-15 minutes for home tasks  ?Baseline: 5 ?Goal status: INITIAL ?  ?  ?LONG TERM GOALS: Target date: 11/09/2021 ?  ?Be independent in advanced HEP ?Baseline:   ?Goal status: INITIAL ?  ?2.  Improve FOTO to > or = to 67 ?Baseline: 37 ?Goal status: INITIAL ?  ?3.  Demonstrate > or = to 110 degrees Rt knee flexion to improve ability to squat and negotiate steps  ?Baseline

## 2021-09-08 ENCOUNTER — Other Ambulatory Visit: Payer: Self-pay | Admitting: Physician Assistant

## 2021-09-08 ENCOUNTER — Other Ambulatory Visit (HOSPITAL_COMMUNITY): Payer: Self-pay

## 2021-09-09 ENCOUNTER — Encounter: Payer: Self-pay | Admitting: Physician Assistant

## 2021-09-09 ENCOUNTER — Telehealth: Payer: Self-pay

## 2021-09-09 ENCOUNTER — Ambulatory Visit (INDEPENDENT_AMBULATORY_CARE_PROVIDER_SITE_OTHER): Payer: No Typology Code available for payment source | Admitting: Physician Assistant

## 2021-09-09 ENCOUNTER — Ambulatory Visit (INDEPENDENT_AMBULATORY_CARE_PROVIDER_SITE_OTHER): Payer: No Typology Code available for payment source

## 2021-09-09 ENCOUNTER — Other Ambulatory Visit: Payer: Self-pay | Admitting: Physician Assistant

## 2021-09-09 ENCOUNTER — Other Ambulatory Visit: Payer: Self-pay

## 2021-09-09 ENCOUNTER — Other Ambulatory Visit (HOSPITAL_COMMUNITY): Payer: Self-pay

## 2021-09-09 DIAGNOSIS — Z96651 Presence of right artificial knee joint: Secondary | ICD-10-CM

## 2021-09-09 MED ORDER — HYDROMORPHONE HCL 2 MG PO TABS
2.0000 mg | ORAL_TABLET | Freq: Four times a day (QID) | ORAL | 0 refills | Status: DC | PRN
  Filled 2021-09-09: qty 30, 8d supply, fill #0

## 2021-09-09 MED ORDER — METHOCARBAMOL 500 MG PO TABS
500.0000 mg | ORAL_TABLET | Freq: Two times a day (BID) | ORAL | 0 refills | Status: DC | PRN
Start: 2021-09-09 — End: 2021-09-19
  Filled 2021-09-09: qty 20, 10d supply, fill #0

## 2021-09-09 MED ORDER — ZOLPIDEM TARTRATE 5 MG PO TABS
5.0000 mg | ORAL_TABLET | Freq: Every evening | ORAL | 0 refills | Status: AC | PRN
  Filled 2021-09-09: qty 30, 30d supply, fill #0

## 2021-09-09 MED ORDER — TRAMADOL HCL 50 MG PO TABS
50.0000 mg | ORAL_TABLET | Freq: Four times a day (QID) | ORAL | 2 refills | Status: AC | PRN
  Filled 2021-09-09: qty 60, 15d supply, fill #0
  Filled 2021-09-26: qty 60, 15d supply, fill #1
  Filled 2021-10-17: qty 60, 15d supply, fill #2

## 2021-09-09 NOTE — Telephone Encounter (Signed)
Talked with patient and advised her of message below. 

## 2021-09-09 NOTE — Progress Notes (Signed)
? ?Post-Op Visit Note ?  ?Patient: Samantha Curtis           ?Date of Birth: [birth year]           ?MRN: [id number] ?Visit Date: 09/09/2021 ?PCP: Lavone Orn, MD ? ? ?Assessment & Plan: ? ?Chief Complaint:  ?Chief Complaint  ?Patient presents with  ? Right Knee - Pain  ? ?Visit Diagnoses:  ?1. Status post revision of total replacement of right knee   ? ? ?Plan: Patient is a pleasant 56 year old female who comes in today 2 weeks status post right total knee revision, date of surgery 08/26/2021.  She has been in a fair amount of pain since surgery.  She has been in outpatient physical therapy making good progress over the past week and a half.  She is ambulating with a single-point cane.  No calf pain.  Examination of her right knee reveals a fully healed surgical scar with nylon sutures in place.  No evidence of infection or cellulitis.  Calf is soft nontender.  She is neurovascular intact distally.  At this point, sutures removed and Steri-Strips applied.  She will continue with physical therapy.  She will continue with her baby aspirin twice daily for the next 4 weeks.  Dental prophylaxis reinforced.  Follow-up with [place] in 4 weeks time for repeat evaluation and 2 view x-rays of the right knee.  Call with concerns or questions. ? ?Follow-Up Instructions: Return in about 4 weeks (around 10/07/2021).  ? ?Orders:  ?Orders Placed This Encounter  ?Procedures  ? XR Knee 1-2 Views Right  ? ?Meds ordered this encounter  ?Medications  ? HYDROmorphone (DILAUDID) 2 MG tablet  ?  Sig: Take 1 tablet (2 mg total) by mouth every 6 (six) hours as needed for severe pain.  ?  Dispense:  30 tablet  ?  Refill:  0  ?  Patient underwent total knee revision Friday and experienced severe itching from post-op oxycodone.  She picked up percocet last week which was called in to be taken after surgery.  We are switching post-op pain meds to dilaudid due to the itching.  This rx is ok to fill.  Thanks!  ? zolpidem (AMBIEN) 5 MG tablet  ?  Sig: Take 1  tablet (5 mg total) by mouth at bedtime as needed for sleep.  ?  Dispense:  30 tablet  ?  Refill:  0  ? traMADol (ULTRAM) 50 MG tablet  ?  Sig: Take 1 tablet (50 mg total) by mouth every 6 (six) hours as needed. To be taken instead of dilaudid for moderate  pain  ?  Dispense:  60 tablet  ?  Refill:  2  ? ? ?Imaging: ?XR Knee 1-2 Views Right ? ?Result Date: 09/09/2021 ?Well-seated prosthesis without complication  ? ?PMFS History: ?Patient Active Problem List  ? Diagnosis Date Noted  ? Loosening of prosthesis of right total knee replacement ([place]) 08/26/2021  ? Status post revision of total replacement of right knee 08/26/2021  ? Status post total right knee replacement 08/04/2021  ? Allergic rhinitis 04/27/2021  ? Body mass index (BMI) of 25.0 to 29.9 04/27/2021  ? Cardiomegaly 04/27/2021  ? Cough variant asthma 04/27/2021  ? Gastroduodenitis 04/27/2021  ? Osteoarthritis of knee 04/27/2021  ? Pure hypercholesterolemia 04/27/2021  ? Restless legs 04/27/2021  ? Ventricular premature depolarization 04/27/2021  ? S/P TKR (total knee replacement), bilateral   ? Gastroesophageal reflux disease   ? Post-operative pain   ? Elevated blood pressure  reading   ? Bilateral primary osteoarthritis of knee 07/03/2017  ? Chest pain 08/31/2014  ? Palpitations 08/31/2014  ? Lateral epicondylitis of right elbow 04/02/2013  ? ?Past Medical History:  ?Diagnosis Date  ? Arthritis   ? "knees" (07/04/2017)  ? Complication of anesthesia   ? Dysrhythmia   ? PALPITATIONS  ? GERD (gastroesophageal reflux disease)   ? High cholesterol   ? Leg pain   ? PONV (postoperative nausea and vomiting)   ?  ?Family History  ?Problem Relation Age of Onset  ? Dementia Mother   ?  ?Past Surgical History:  ?Procedure Laterality Date  ? ENDOVENOUS ABLATION SAPHENOUS VEIN W/ LASER Left 2005  ? [facility]  ? JOINT REPLACEMENT    ? KNEE ARTHROSCOPY Right   ? SHOULDER ARTHROSCOPY Right   ? "spurs"  ? SHOULDER ARTHROSCOPY Right 06/24/2019  ? Procedure: RIGHT  SHOULDER ARTHROSCOPY; CHONDROPLASTY AND DEBRIEDMENT;  Surgeon: Melrose Nakayama, MD;  [facility];  Service: Orthopedics;  Laterality: Right;  ? TOTAL KNEE ARTHROPLASTY Bilateral 07/03/2017  ? Procedure: TOTAL KNEE BILATERAL;  Surgeon: Melrose Nakayama, MD;  Location: [place];  Service: Orthopedics;  Laterality: Bilateral;  ? TOTAL KNEE REVISION Right 08/26/2021  ? Procedure: RIGHT TOTAL KNEE REVISION;  Surgeon: Leandrew Koyanagi, MD;  Location: [place];  Service: Orthopedics;  Laterality: Right;  ? TUBAL LIGATION    ? VAGINAL HYSTERECTOMY    ? ?Social History  ? ?Occupational History  ? Not on file  ?Tobacco Use  ? Smoking status: Never  ? Smokeless tobacco: Never  ?Vaping Use  ? Vaping Use: Never used  ?Substance and Sexual Activity  ? Alcohol use: No  ? Drug use: No  ? Sexual activity: Yes  ? ? ? ?

## 2021-09-09 NOTE — Telephone Encounter (Signed)
sent 

## 2021-09-09 NOTE — Telephone Encounter (Signed)
Patient would like a Rx refill for Robaxin sent to [facility] pharmacy.  Cb# [phone].  Please advise.  Thank you. ?

## 2021-09-12 ENCOUNTER — Ambulatory Visit: Payer: No Typology Code available for payment source

## 2021-09-12 ENCOUNTER — Other Ambulatory Visit: Payer: Self-pay

## 2021-09-12 DIAGNOSIS — M6281 Muscle weakness (generalized): Secondary | ICD-10-CM

## 2021-09-12 DIAGNOSIS — M25561 Pain in right knee: Secondary | ICD-10-CM

## 2021-09-12 DIAGNOSIS — R600 Localized edema: Secondary | ICD-10-CM

## 2021-09-12 DIAGNOSIS — R2689 Other abnormalities of gait and mobility: Secondary | ICD-10-CM

## 2021-09-12 NOTE — Therapy (Signed)
?OUTPATIENT PHYSICAL THERAPY TREATMENT NOTE ? ? ?Patient Name: Samantha Curtis ?MRN: [id number] ?DOB:02-07-1966, 56 y.o., female ?Today's Date: 09/12/2021 ? ?PCP: Lavone Orn, MD ?REFERRING PROVIDER: Lavone Orn, MD ? ? PT End of Session - 09/12/21 1310   ? ? Visit Number 5   ? Date for PT Re-Evaluation 11/09/21   ? Authorization Type UMR- no visit limit   ? PT Start Time 1230   ? PT Stop Time 1320   ? PT Time Calculation (min) 50 min   ? Activity Tolerance Patient tolerated treatment well   ? Behavior During Therapy [facility] for tasks assessed/performed   ? ?  ?  ? ?  ? ? ? ? ?Past Medical History:  ?Diagnosis Date  ? Arthritis   ? "knees" (07/04/2017)  ? Complication of anesthesia   ? Dysrhythmia   ? PALPITATIONS  ? GERD (gastroesophageal reflux disease)   ? High cholesterol   ? Leg pain   ? PONV (postoperative nausea and vomiting)   ? ?Past Surgical History:  ?Procedure Laterality Date  ? ENDOVENOUS ABLATION SAPHENOUS VEIN W/ LASER Left 2005  ? [facility]  ? JOINT REPLACEMENT    ? KNEE ARTHROSCOPY Right   ? SHOULDER ARTHROSCOPY Right   ? "spurs"  ? SHOULDER ARTHROSCOPY Right 06/24/2019  ? Procedure: RIGHT SHOULDER ARTHROSCOPY; CHONDROPLASTY AND DEBRIEDMENT;  Surgeon: Melrose Nakayama, MD;  Location: [facility];  Service: Orthopedics;  Laterality: Right;  ? TOTAL KNEE ARTHROPLASTY Bilateral 07/03/2017  ? Procedure: TOTAL KNEE BILATERAL;  Surgeon: Melrose Nakayama, MD;  Location: [place];  Service: Orthopedics;  Laterality: Bilateral;  ? TOTAL KNEE REVISION Right 08/26/2021  ? Procedure: RIGHT TOTAL KNEE REVISION;  Surgeon: Leandrew Koyanagi, MD;  Location: [place];  Service: Orthopedics;  Laterality: Right;  ? TUBAL LIGATION    ? VAGINAL HYSTERECTOMY    ? ?Patient Active Problem List  ? Diagnosis Date Noted  ? Loosening of prosthesis of right total knee replacement ([place]) 08/26/2021  ? Status post revision of total replacement of right knee 08/26/2021  ? Status post total right knee replacement 08/04/2021  ? Allergic rhinitis  04/27/2021  ? Body mass index (BMI) of 25.0 to 29.9 04/27/2021  ? Cardiomegaly 04/27/2021  ? Cough variant asthma 04/27/2021  ? Gastroduodenitis 04/27/2021  ? Osteoarthritis of knee 04/27/2021  ? Pure hypercholesterolemia 04/27/2021  ? Restless legs 04/27/2021  ? Ventricular premature depolarization 04/27/2021  ? S/P TKR (total knee replacement), bilateral   ? Gastroesophageal reflux disease   ? Post-operative pain   ? Elevated blood pressure reading   ? Bilateral primary osteoarthritis of knee 07/03/2017  ? Chest pain 08/31/2014  ? Palpitations 08/31/2014  ? Lateral epicondylitis of right elbow 04/02/2013  ? ? ?REFERRING DIAG: satus post total rt knee replacement (revision) ? ?THERAPY DIAG:  ?Muscle weakness (generalized) ? ?Localized edema ? ?Other abnormalities of gait and mobility ? ?Acute pain of right knee ? ?PERTINENT HISTORY: Bil TKA 2019, Rt shoulder scope 06/2019, Rt total knee revision 08/26/21 ? ?PRECAUTIONS: None ? ?SUBJECTIVE: I am tolerating the pain better during the day.  It is still bad at night and i'm waking up often.  I saw the MD and my bandage has been removed.  I am doing the bike at home and can do full revolutions.   ?PAIN:  ?Are you having pain? Yes: NPRS scale: 5/10 ?Pain location: RT knee ?Pain description: Sore ?Aggravating factors: A lot of weightbearing ?Relieving factors: Ice. Meds maybe ? ?OBJECTIVE:  ?  ?  PATIENT SURVEYS:  ?FOTO 37 (goal is 41) ? ?PALPATION: ?Edema about the Rt knee, healing surgical incision, diffuse palpable tenderness.  ?  ?LE ROM: ?  ?Passive ROM Right ?08/31/2021 Left ?08/31/2021 Right ?09/05/21 Right ?09/12/21  ?Hip flexion        ?Hip extension        ?Hip abduction        ?Hip adduction        ?Hip internal rotation        ?Hip external rotation        ?Knee flexion 75 full 86 92  ?Knee extension -15 full 8 -8  ?Ankle dorsiflexion        ?Ankle plantarflexion        ?Ankle inversion        ?Ankle eversion        ? (Blank rows = not tested)  Painful with all  testing  ?  ?LE MMT: ?  ?MMT Right ?08/31/2021 Left ?08/31/2021  ?Hip flexion 3-/5 (SLR)    ?Hip extension      ?Hip abduction      ?Hip adduction      ?Hip internal rotation      ?Hip external rotation      ?Knee flexion 3+/5 full  ?Knee extension 3-/5 full  ?Ankle dorsiflexion 4+/5 full  ?Ankle plantarflexion   full  ?Ankle inversion   full  ?Ankle eversion   full  ? (Blank rows = not tested)  Too painful to test additional muscles. ?Poor quad set- gluteal compensation ?  ?GAIT: ?Distance walked: 100 ft ?Assistive device utilized: Single point cane ?Level of assistance: Modified independence ?Comments: Antalgic gait, reduced step length and time spent on Rt LE ?  ?TODAY'S TREATMENT: ?09/12/21: ?Nustep L6 x 8 min- PT present to discuss progress. ?Standing rockerboardx 3 minutes  ?Seated hamstring stretch 3x20 seconds  ?Standing heel raises 2x10 for contract Rt quad ?Weightshifting on balance pad x 1 minute ?Sit to stand: 2x10 ?Heel slides supine with strap x10 ?MANUAL:elongation to Rt quads, hamstring and gastroc, patellar mobs.  ?Game Ready x 10 min with medium compression 3 flakes ? ?09/07/21: ?Nustep L5 x10 min- PT present to discuss progress. ?Standing rockerboardx 3 minutes  ?Seated hamstring stretch 3x20 seconds  ?LAQ 0# 2x10  ?Standing heel raises 10x Vc for contract Rt quad ?Weightshifting on balance pad x 1 minute ?Heel raises 2x10 ?Sit to stand: 2x10 ?Heel slides 20x seated with slider under foot  ?MANUAL: Hamstring soft tissue work/gentle extension stretching ?Game Ready x 15 min with medium compression 3 flakes ? ? ?09/05/21: ?Nustep L1 x10 min ?LAQ 0# 2x10  ?Standing heel raises 10x Vc for contract Rt quad ?Standing hip abduction Bil 10x ?Standing RT gastroc stretch 4x 10 sec ?Heel slides 20x supine  ?MANUAL: Hamstring soft tissue work/gentle extension stretching ?Game Ready x 15 min with medium compression 3 flakes ? ?  ?PATIENT EDUCATION:  ?Education details: Access Code: [email or web address] ?Person educated:  Patient ?Education method: Explanation, Demonstration, and Handouts ?Education comprehension: verbalized understanding and returned demonstration ?  ?HOME EXERCISE PROGRAM: ?Access Code: [email or web address] ?URL: [email or web address] ?Date: 08/31/2021 ?Prepared by: Claiborne Billings ?  ?Exercises ?Supine Ankle Pumps - 3 x daily - 7 x weekly - 2 sets - 15 reps ?Supine Quadricep Sets - 3 x daily - 7 x weekly - 2 sets - 10 reps - 5 hold ?Supine Active Straight Leg Raise - 2 x daily - 7 x weekly - 1 sets - 10 reps ?Supine Short Arc  Quad - 2 x daily - 7 x weekly - 3 sets - 15 reps ?Supine Heel Slides - 3 x daily - 7 x weekly - 1 sets - 10 reps ?Seated Long Arc Quad - 1 x daily - 7 x weekly - 3 sets - 15 reps ?Seated Knee Flexion Stretch - 1 x daily - 7 x weekly - 3 sets - 15 reps ?  ?Patient Education ?Total Knee Replacement Handout ?  ?ASSESSMENT: ?  ?CLINICAL IMPRESSION: ?Pt is making expected progress s/p TKA revision.  Pain levels have been more manageable during the day and continues to have pain that limits her sleep.  Pt had her bandage removed at the MD and has been driving short distances.  Pt has been riding her bike at home and is doing full revolutions.  Rt knee A/ROM is improved and functional gait and mobility is improved. Pt requires monitoring for pain throughout and verbal cueing for quad activation with step-ups and heel raises.  Pt will continue to benefit from skilled PT to address strength, gait, flexibility and edema management s/p TKA.   ? ?OBJECTIVE IMPAIRMENTS Abnormal gait, decreased activity tolerance, decreased balance, decreased endurance, difficulty walking, decreased ROM, decreased strength, increased edema, impaired flexibility, and pain.  ?   ?  ?GOALS: ?Goals reviewed with patient? Yes ?  ?SHORT TERM GOALS: Target date: 10/05/2021 ?  ?Be independent in initial HEP ?Baseline:  ?Goal status: Met 09/05/21 ?  ?2.  Report < or = to 5/10 Rt knee pain with standing and walking to improve independence at  home ?Baseline: 7/10 (09/07/21) ?Goal status: in progress ?  ?3.  Demonstrate Rt knee A/ROM flexion to > or = to 95 degrees to improve sitting and steps with less compensation ?Baseline:  ?Goal status: INITIAL ?  ?4.

## 2021-09-14 ENCOUNTER — Ambulatory Visit: Payer: No Typology Code available for payment source

## 2021-09-14 DIAGNOSIS — R600 Localized edema: Secondary | ICD-10-CM

## 2021-09-14 DIAGNOSIS — M6281 Muscle weakness (generalized): Secondary | ICD-10-CM

## 2021-09-14 DIAGNOSIS — M25561 Pain in right knee: Secondary | ICD-10-CM

## 2021-09-14 DIAGNOSIS — R2689 Other abnormalities of gait and mobility: Secondary | ICD-10-CM

## 2021-09-14 NOTE — Therapy (Signed)
?OUTPATIENT PHYSICAL THERAPY TREATMENT NOTE ? ? ?Patient Name: Samantha Curtis ?MRN: [id number] ?DOB:26-Sep-1965, 56 y.o., female ?Today's Date: 09/14/2021 ? ?PCP: Lavone Orn, MD ?REFERRING PROVIDER: Lavone Orn, MD ? ? PT End of Session - 09/14/21 1306   ? ? Visit Number 6   ? Date for PT Re-Evaluation 11/09/21   ? Authorization Type UMR- no visit limit   ? PT Start Time 1225   ? PT Stop Time 1315   ? PT Time Calculation (min) 50 min   ? Activity Tolerance Patient tolerated treatment well   ? Behavior During Therapy [facility] for tasks assessed/performed   ? ?  ?  ? ?  ? ? ? ? ? ?Past Medical History:  ?Diagnosis Date  ? Arthritis   ? "knees" (07/04/2017)  ? Complication of anesthesia   ? Dysrhythmia   ? PALPITATIONS  ? GERD (gastroesophageal reflux disease)   ? High cholesterol   ? Leg pain   ? PONV (postoperative nausea and vomiting)   ? ?Past Surgical History:  ?Procedure Laterality Date  ? ENDOVENOUS ABLATION SAPHENOUS VEIN W/ LASER Left 2005  ? [facility]  ? JOINT REPLACEMENT    ? KNEE ARTHROSCOPY Right   ? SHOULDER ARTHROSCOPY Right   ? "spurs"  ? SHOULDER ARTHROSCOPY Right 06/24/2019  ? Procedure: RIGHT SHOULDER ARTHROSCOPY; CHONDROPLASTY AND DEBRIEDMENT;  Surgeon: Melrose Nakayama, MD;  Location: [facility];  Service: Orthopedics;  Laterality: Right;  ? TOTAL KNEE ARTHROPLASTY Bilateral 07/03/2017  ? Procedure: TOTAL KNEE BILATERAL;  Surgeon: Melrose Nakayama, MD;  Location: [place];  Service: Orthopedics;  Laterality: Bilateral;  ? TOTAL KNEE REVISION Right 08/26/2021  ? Procedure: RIGHT TOTAL KNEE REVISION;  Surgeon: Leandrew Koyanagi, MD;  Location: [place];  Service: Orthopedics;  Laterality: Right;  ? TUBAL LIGATION    ? VAGINAL HYSTERECTOMY    ? ?Patient Active Problem List  ? Diagnosis Date Noted  ? Loosening of prosthesis of right total knee replacement ([place]) 08/26/2021  ? Status post revision of total replacement of right knee 08/26/2021  ? Status post total right knee replacement 08/04/2021  ? Allergic rhinitis  04/27/2021  ? Body mass index (BMI) of 25.0 to 29.9 04/27/2021  ? Cardiomegaly 04/27/2021  ? Cough variant asthma 04/27/2021  ? Gastroduodenitis 04/27/2021  ? Osteoarthritis of knee 04/27/2021  ? Pure hypercholesterolemia 04/27/2021  ? Restless legs 04/27/2021  ? Ventricular premature depolarization 04/27/2021  ? S/P TKR (total knee replacement), bilateral   ? Gastroesophageal reflux disease   ? Post-operative pain   ? Elevated blood pressure reading   ? Bilateral primary osteoarthritis of knee 07/03/2017  ? Chest pain 08/31/2014  ? Palpitations 08/31/2014  ? Lateral epicondylitis of right elbow 04/02/2013  ? ? ?REFERRING DIAG: satus post total rt knee replacement (revision) ? ?THERAPY DIAG:  ?Muscle weakness (generalized) ? ?Other abnormalities of gait and mobility ? ?Localized edema ? ?Acute pain of right knee ? ?PERTINENT HISTORY: Bil TKA 2019, Rt shoulder scope 06/2019, Rt total knee revision 08/26/21 ? ?PRECAUTIONS: None ? ?SUBJECTIVE: I walked in [employer] right before I came here.  I'm tired.  I am still not sleeping well at night ?PAIN:  ?Are you having pain? Yes: NPRS scale: 6/10 ?Pain location: RT knee ?Pain description: Sore ?Aggravating factors: A lot of weightbearing ?Relieving factors: Ice. Meds maybe ? ?OBJECTIVE:  ?  ?PATIENT SURVEYS:  ?FOTO 37 (goal is 42) ? ?PALPATION: ?Edema about the Rt knee, healing surgical incision, diffuse palpable tenderness.  ?  ?LE  ROM: ?  ?Passive ROM Right ?08/31/2021 Left ?08/31/2021 Right ?09/05/21 Right ?09/12/21  ?Hip flexion        ?Hip extension        ?Hip abduction        ?Hip adduction        ?Hip internal rotation        ?Hip external rotation        ?Knee flexion 75 full 86 92  ?Knee extension -15 full 8 -8  ?Ankle dorsiflexion        ?Ankle plantarflexion        ?Ankle inversion        ?Ankle eversion        ? (Blank rows = not tested)  Painful with all testing  ?  ?LE MMT: ?  ?MMT Right ?08/31/2021 Left ?08/31/2021  ?Hip flexion 3-/5 (SLR)    ?Hip extension      ?Hip  abduction      ?Hip adduction      ?Hip internal rotation      ?Hip external rotation      ?Knee flexion 3+/5 full  ?Knee extension 3-/5 full  ?Ankle dorsiflexion 4+/5 full  ?Ankle plantarflexion   full  ?Ankle inversion   full  ?Ankle eversion   full  ? (Blank rows = not tested)  Too painful to test additional muscles. ?Poor quad set- gluteal compensation ?  ?GAIT: ?Distance walked: 100 ft ?Assistive device utilized: Single point cane ?Level of assistance: Modified independence ?Comments: Antalgic gait, reduced step length and time spent on Rt LE ?  ?TODAY'S TREATMENT: ?09/14/21: ?Nustep L6 x 8 min- PT present to discuss progress. ?Standing rockerboard x 3 minutes  ?Seated hamstring stretch 3x20 seconds  ?4" step-ups Rt only x10  ?SLR x10 with quad set ?SAQs 5" hold x20 ?Heel slides seated with slider x10 ?MANUAL:elongation to Rt quads, hamstring and gastroc, patellar mobs.  ?Game Ready x 10 min with medium compression 3 flakes ?09/12/21: ?Nustep L6 x 8 min- PT present to discuss progress. ?Standing rockerboardx 3 minutes  ?Seated hamstring stretch 3x20 seconds  ?Standing heel raises 2x10 for contract Rt quad ?Weightshifting on balance pad x 1 minute ?Sit to stand: 2x10 ?Heel slides supine with strap x10 ?MANUAL:elongation to Rt quads, hamstring and gastroc, patellar mobs.  ?Game Ready x 10 min with medium compression 3 flakes ? ?09/07/21: ?Nustep L5 x10 min- PT present to discuss progress. ?Standing rockerboardx 3 minutes  ?Seated hamstring stretch 3x20 seconds  ?LAQ 0# 2x10  ?Standing heel raises 10x Vc for contract Rt quad ?Weightshifting on balance pad x 1 minute ?Heel raises 2x10 ?Sit to stand: 2x10 ?Heel slides 20x seated with slider under foot  ?MANUAL: Hamstring soft tissue work/gentle extension stretching ?Game Ready x 15 min with medium compression 3 flakes ? ?  ?PATIENT EDUCATION:  ?Education details: Access Code: [email or web address] ?Person educated: Patient ?Education method: Explanation, Demonstration, and  Handouts ?Education comprehension: verbalized understanding and returned demonstration ?  ?HOME EXERCISE PROGRAM: ?Access Code: [email or web address] ?URL: [email or web address] ?Date: 08/31/2021 ?Prepared by: Claiborne Billings ?  ?Exercises ?Supine Ankle Pumps - 3 x daily - 7 x weekly - 2 sets - 15 reps ?Supine Quadricep Sets - 3 x daily - 7 x weekly - 2 sets - 10 reps - 5 hold ?Supine Active Straight Leg Raise - 2 x daily - 7 x weekly - 1 sets - 10 reps ?Supine Short Arc Quad - 2 x daily - 7 x weekly - 3 sets - 15 reps ?  Supine Heel Slides - 3 x daily - 7 x weekly - 1 sets - 10 reps ?Seated Long Arc Quad - 1 x daily - 7 x weekly - 3 sets - 15 reps ?Seated Knee Flexion Stretch - 1 x daily - 7 x weekly - 3 sets - 15 reps ?  ?Patient Education ?Total Knee Replacement Handout ?  ?ASSESSMENT: ?  ?CLINICAL IMPRESSION: ?Pt is making expected progress s/p TKA revision.  Pain levels have been more manageable during the day and pt continues to wake 4-5 times a night.   Pt has been riding her bike at home and is doing full revolutions.  Rt knee A/ROM is improved and functional gait and mobility is improved.  More done in sitting/supine today due to fatigue after going to [employer] prior to treatment.   Pt will continue to benefit from skilled PT to address strength, gait, flexibility and edema management s/p TKA.   ? ?OBJECTIVE IMPAIRMENTS Abnormal gait, decreased activity tolerance, decreased balance, decreased endurance, difficulty walking, decreased ROM, decreased strength, increased edema, impaired flexibility, and pain.  ?   ?  ?GOALS: ?Goals reviewed with patient? Yes ?  ?SHORT TERM GOALS: Target date: 10/05/2021 ?  ?Be independent in initial HEP ?Baseline:  ?Goal status: Met 09/05/21 ?  ?2.  Report < or = to 5/10 Rt knee pain with standing and walking to improve independence at home ?Baseline: 7/10 (09/07/21) ?Goal status: in progress ?  ?3.  Demonstrate Rt knee A/ROM flexion to > or = to 95 degrees to improve sitting and steps with  less compensation ?Baseline:  ?Goal status: INITIAL ?  ?4.  Demonstrate Rt knee A/ROM extension to lacking < or  = to 8 degrees to normalize gait pattern ?Baseline: 15 ?Goal status: Met 09/05/21  ?5.  Tolerate stand

## 2021-09-19 ENCOUNTER — Ambulatory Visit: Payer: No Typology Code available for payment source | Attending: Orthopaedic Surgery

## 2021-09-19 ENCOUNTER — Other Ambulatory Visit: Payer: Self-pay | Admitting: Physician Assistant

## 2021-09-19 ENCOUNTER — Encounter: Payer: Self-pay | Admitting: Orthopaedic Surgery

## 2021-09-19 ENCOUNTER — Other Ambulatory Visit (HOSPITAL_COMMUNITY): Payer: Self-pay

## 2021-09-19 DIAGNOSIS — R600 Localized edema: Secondary | ICD-10-CM | POA: Diagnosis present

## 2021-09-19 DIAGNOSIS — R2689 Other abnormalities of gait and mobility: Secondary | ICD-10-CM | POA: Diagnosis present

## 2021-09-19 DIAGNOSIS — M25561 Pain in right knee: Secondary | ICD-10-CM | POA: Insufficient documentation

## 2021-09-19 DIAGNOSIS — M6281 Muscle weakness (generalized): Secondary | ICD-10-CM | POA: Insufficient documentation

## 2021-09-19 MED ORDER — METHOCARBAMOL 750 MG PO TABS
750.0000 mg | ORAL_TABLET | Freq: Three times a day (TID) | ORAL | 0 refills | Status: AC | PRN
  Filled 2021-09-19: qty 30, 10d supply, fill #0

## 2021-09-19 NOTE — Therapy (Signed)
?OUTPATIENT PHYSICAL THERAPY TREATMENT NOTE ? ? ?Patient Name: Samantha Curtis ?MRN: [id number] ?DOB:09/03/1965, 56 y.o., female ?Today's Date: 09/19/2021 ? ?PCP: Lavone Orn, MD ?REFERRING PROVIDER: Lavone Orn, MD ? ? PT End of Session - 09/19/21 1309   ? ? Visit Number 7   ? Date for PT Re-Evaluation 11/09/21   ? Authorization Type UMR- no visit limit   ? PT Start Time 1572   ? PT Stop Time 1318   ? PT Time Calculation (min) 54 min   ? Activity Tolerance Patient tolerated treatment well   ? Behavior During Therapy [facility] for tasks assessed/performed   ? ?  ?  ? ?  ? ? ? ? ? ? ?Past Medical History:  ?Diagnosis Date  ? Arthritis   ? "knees" (07/04/2017)  ? Complication of anesthesia   ? Dysrhythmia   ? PALPITATIONS  ? GERD (gastroesophageal reflux disease)   ? High cholesterol   ? Leg pain   ? PONV (postoperative nausea and vomiting)   ? ?Past Surgical History:  ?Procedure Laterality Date  ? ENDOVENOUS ABLATION SAPHENOUS VEIN W/ LASER Left 2005  ? [facility]  ? JOINT REPLACEMENT    ? KNEE ARTHROSCOPY Right   ? SHOULDER ARTHROSCOPY Right   ? "spurs"  ? SHOULDER ARTHROSCOPY Right 06/24/2019  ? Procedure: RIGHT SHOULDER ARTHROSCOPY; CHONDROPLASTY AND DEBRIEDMENT;  Surgeon: Melrose Nakayama, MD;  Location: [facility];  Service: Orthopedics;  Laterality: Right;  ? TOTAL KNEE ARTHROPLASTY Bilateral 07/03/2017  ? Procedure: TOTAL KNEE BILATERAL;  Surgeon: Melrose Nakayama, MD;  Location: [facility];  Service: Orthopedics;  Laterality: Bilateral;  ? TOTAL KNEE REVISION Right 08/26/2021  ? Procedure: RIGHT TOTAL KNEE REVISION;  Surgeon: Leandrew Koyanagi, MD;  Location: [facility];  Service: Orthopedics;  Laterality: Right;  ? TUBAL LIGATION    ? VAGINAL HYSTERECTOMY    ? ?Patient Active Problem List  ? Diagnosis Date Noted  ? Loosening of prosthesis of right total knee replacement ([place]) 08/26/2021  ? Status post revision of total replacement of right knee 08/26/2021  ? Status post total right knee replacement 08/04/2021  ? Allergic rhinitis  04/27/2021  ? Body mass index (BMI) of 25.0 to 29.9 04/27/2021  ? Cardiomegaly 04/27/2021  ? Cough variant asthma 04/27/2021  ? Gastroduodenitis 04/27/2021  ? Osteoarthritis of knee 04/27/2021  ? Pure hypercholesterolemia 04/27/2021  ? Restless legs 04/27/2021  ? Ventricular premature depolarization 04/27/2021  ? S/P TKR (total knee replacement), bilateral   ? Gastroesophageal reflux disease   ? Post-operative pain   ? Elevated blood pressure reading   ? Bilateral primary osteoarthritis of knee 07/03/2017  ? Chest pain 08/31/2014  ? Palpitations 08/31/2014  ? Lateral epicondylitis of right elbow 04/02/2013  ? ? ?REFERRING DIAG: satus post total rt knee replacement (revision) ? ?THERAPY DIAG:  ?Muscle weakness (generalized) ? ?Other abnormalities of gait and mobility ? ?Localized edema ? ?Acute pain of right knee ? ?PERTINENT HISTORY: Bil TKA 2019, Rt shoulder scope 06/2019, Rt total knee revision 08/26/21 ? ?PRECAUTIONS: None ? ?SUBJECTIVE: My sleep is still very disturbed by pain.  I need to call the MD because this has not changed.  I overdid it and had some increased pain.   ?PAIN:  ?Are you having pain? Yes: NPRS scale: 7/10 ?Pain location: RT knee ?Pain description: Sore ?Aggravating factors: A lot of weightbearing ?Relieving factors: Ice. Meds maybe ? ?OBJECTIVE:  ?  ?PATIENT SURVEYS:  ?FOTO 37 (goal is 49) ? ?PALPATION: ?Edema about the Rt  knee, healing surgical incision, diffuse palpable tenderness.  ?  ?LE ROM: ?  ?Passive ROM Right ?08/31/2021 Left ?08/31/2021 Right ?09/05/21 Right ?09/12/21  ?Hip flexion        ?Hip extension        ?Hip abduction        ?Hip adduction        ?Hip internal rotation        ?Hip external rotation        ?Knee flexion 75 full 86 92  ?Knee extension -15 full 8 -8  ?Ankle dorsiflexion        ?Ankle plantarflexion        ?Ankle inversion        ?Ankle eversion        ? (Blank rows = not tested)  Painful with all testing  ?  ?LE MMT: ?  ?MMT Right ?08/31/2021 Left ?08/31/2021  ?Hip  flexion 3-/5 (SLR)    ?Hip extension      ?Hip abduction      ?Hip adduction      ?Hip internal rotation      ?Hip external rotation      ?Knee flexion 3+/5 full  ?Knee extension 3-/5 full  ?Ankle dorsiflexion 4+/5 full  ?Ankle plantarflexion   full  ?Ankle inversion   full  ?Ankle eversion   full  ? (Blank rows = not tested)  Too painful to test additional muscles. ?Poor quad set- gluteal compensation ?  ?GAIT: ?Distance walked: 100 ft ?Assistive device utilized: Single point cane ?Level of assistance: Modified independence ?Comments: Antalgic gait, reduced step length and time spent on Rt LE ?  ?TODAY'S TREATMENT: ?09/19/21: ?Nustep L6 x 8 min- PT present to discuss progress. ?Standing rockerboard x 3 minutes  ?Standing hamstring stretch 3x20 seconds  ?4" step-down-Rt only 2x10 ?Sit to stand: x10 with focus on eccentric control.  ?SAQs 5" hold x20 ?Weightshifting on balance pad: x1 min side to side and 1 min with Rt leg anterior ?Heel slides seated with slider x10 ?MANUAL:elongation to Rt quads, hamstring and gastroc, scar and patellar mobs.  ?Game Ready x 10 min with medium compression 3 flakes ? ?09/14/21: ?Nustep L6 x 8 min- PT present to discuss progress. ?Standing rockerboard x 3 minutes  ?Seated hamstring stretch 3x20 seconds  ?4" step-ups Rt only x10  ?SLR x10 with quad set ?SAQs 5" hold x20 ?Heel slides seated with slider x10 ?MANUAL:elongation to Rt quads, hamstring and gastroc, patellar mobs.  ?Game Ready x 10 min with medium compression 3 flakes ?09/12/21: ?Nustep L6 x 8 min- PT present to discuss progress. ?Standing rockerboardx 3 minutes  ?Seated hamstring stretch 3x20 seconds  ?Standing heel raises 2x10 for contract Rt quad ?Weightshifting on balance pad x 1 minute ?Sit to stand: 2x10 ?Heel slides supine with strap x10 ?MANUAL:elongation to Rt quads, hamstring and gastroc, patellar mobs.  ?Game Ready x 10 min with medium compression 3 flakes ? ? ?  ?PATIENT EDUCATION:  ?Education details: Access Code:  [email or web address] ?Person educated: Patient ?Education method: Explanation, Demonstration, and Handouts ?Education comprehension: verbalized understanding and returned demonstration ?  ?HOME EXERCISE PROGRAM: ?Access Code: [email or web address] ?URL: [email or web address] ?Date: 08/31/2021 ?Prepared by: Claiborne Billings ?  ?Exercises ?Supine Ankle Pumps - 3 x daily - 7 x weekly - 2 sets - 15 reps ?Supine Quadricep Sets - 3 x daily - 7 x weekly - 2 sets - 10 reps - 5 hold ?Supine Active Straight Leg Raise - 2 x daily - 7 x weekly -  1 sets - 10 reps ?Supine Short Arc Quad - 2 x daily - 7 x weekly - 3 sets - 15 reps ?Supine Heel Slides - 3 x daily - 7 x weekly - 1 sets - 10 reps ?Seated Long Arc Quad - 1 x daily - 7 x weekly - 3 sets - 15 reps ?Seated Knee Flexion Stretch - 1 x daily - 7 x weekly - 3 sets - 15 reps ?  ?Patient Education ?Total Knee Replacement Handout ?  ?ASSESSMENT: ?  ?CLINICAL IMPRESSION: ?Pt is making expected progress s/p TKA revision.  Pt continues to wake many times a night with pain.   Pt is riding her bike at home and is increasingly more active with her daily tasks.   Pt is using her cane for community distances and is using cane for longer distances.  Pt reports that she is negotiating steps with alternating pattern with ascending and step-to with descending. Pt with reduced eccentric control on the Rt and required UE support.   PT  Pt will continue to benefit from skilled PT to address strength, gait, flexibility and edema management s/p TKA.   ? ?OBJECTIVE IMPAIRMENTS Abnormal gait, decreased activity tolerance, decreased balance, decreased endurance, difficulty walking, decreased ROM, decreased strength, increased edema, impaired flexibility, and pain.  ?   ?  ?GOALS: ?Goals reviewed with patient? Yes ?  ?SHORT TERM GOALS: Target date: 10/05/2021 ?  ?Be independent in initial HEP ?Baseline:  ?Goal status: Met 09/05/21 ?  ?2.  Report < or = to 5/10 Rt knee pain with standing and walking to improve independence  at home ?Baseline: 7/10 (09/07/21) ?Goal status: in progress ?  ?3.  Demonstrate Rt knee A/ROM flexion to > or = to 95 degrees to improve sitting and steps with less compensation ?Baseline:  ?Goal status: INI

## 2021-09-19 NOTE — Telephone Encounter (Signed)
I just sent in increased dose and frequency

## 2021-09-21 ENCOUNTER — Ambulatory Visit: Payer: No Typology Code available for payment source

## 2021-09-21 DIAGNOSIS — M6281 Muscle weakness (generalized): Secondary | ICD-10-CM | POA: Diagnosis not present

## 2021-09-21 DIAGNOSIS — M25561 Pain in right knee: Secondary | ICD-10-CM

## 2021-09-21 DIAGNOSIS — R600 Localized edema: Secondary | ICD-10-CM

## 2021-09-21 DIAGNOSIS — R2689 Other abnormalities of gait and mobility: Secondary | ICD-10-CM

## 2021-09-21 NOTE — Therapy (Signed)
?OUTPATIENT PHYSICAL THERAPY TREATMENT NOTE ? ? ?Patient Name: Samantha Curtis ?MRN: [id number] ?DOB:16-Oct-1965, 56 y.o., female ?Today's Date: 09/21/2021 ? ?PCP: Kirby Funk, MD ?REFERRING PROVIDER: Tarry Kos, MD ? ? PT End of Session - 09/21/21 1307   ? ? Visit Number 8   ? Date for PT Re-Evaluation 11/09/21   ? Authorization Type UMR- no visit limit   ? PT Start Time 1225   ? PT Stop Time 1316   ? PT Time Calculation (min) 51 min   ? Activity Tolerance Patient tolerated treatment well   ? Behavior During Therapy [facility] for tasks assessed/performed   ? ?  ?  ? ?  ? ? ? ? ? ? ? ?Past Medical History:  ?Diagnosis Date  ? Arthritis   ? "knees" (07/04/2017)  ? Complication of anesthesia   ? Dysrhythmia   ? PALPITATIONS  ? GERD (gastroesophageal reflux disease)   ? High cholesterol   ? Leg pain   ? PONV (postoperative nausea and vomiting)   ? ?Past Surgical History:  ?Procedure Laterality Date  ? ENDOVENOUS ABLATION SAPHENOUS VEIN W/ LASER Left 2005  ? [facility]  ? JOINT REPLACEMENT    ? KNEE ARTHROSCOPY Right   ? SHOULDER ARTHROSCOPY Right   ? "spurs"  ? SHOULDER ARTHROSCOPY Right 06/24/2019  ? Procedure: RIGHT SHOULDER ARTHROSCOPY; CHONDROPLASTY AND DEBRIEDMENT;  Surgeon: Marcene Corning, MD;  Location: [facility];  Service: Orthopedics;  Laterality: Right;  ? TOTAL KNEE ARTHROPLASTY Bilateral 07/03/2017  ? Procedure: TOTAL KNEE BILATERAL;  Surgeon: Marcene Corning, MD;  Location: [facility];  Service: Orthopedics;  Laterality: Bilateral;  ? TOTAL KNEE REVISION Right 08/26/2021  ? Procedure: RIGHT TOTAL KNEE REVISION;  Surgeon: Tarry Kos, MD;  Location: [facility];  Service: Orthopedics;  Laterality: Right;  ? TUBAL LIGATION    ? VAGINAL HYSTERECTOMY    ? ?Patient Active Problem List  ? Diagnosis Date Noted  ? 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 Status post revision of total replacement of right knee 08/26/2021  ? Status post total right knee replacement 08/04/2021  ? Allergic rhinitis  04/27/2021  ? Body mass index (BMI) of 25.0 to 29.9 04/27/2021  ? Cardiomegaly 04/27/2021  ? Cough variant asthma 04/27/2021  ? Gastroduodenitis 04/27/2021  ? Osteoarthritis of knee 04/27/2021  ? Pure hypercholesterolemia 04/27/2021  ? Restless legs 04/27/2021  ? Ventricular premature depolarization 04/27/2021  ? S/P TKR (total knee replacement), bilateral   ? Gastroesophageal reflux disease   ? Post-operative pain   ? Elevated blood pressure reading   ? Bilateral primary osteoarthritis of knee 07/03/2017  ? Chest pain 08/31/2014  ? Palpitations 08/31/2014  ? Lateral epicondylitis of right elbow 04/02/2013  ? ? ?REFERRING DIAG: satus post total rt knee replacement (revision) ? ?THERAPY DIAG:  ?Muscle weakness (generalized) ? ?Other abnormalities of gait and mobility ? ?Localized edema ? ?Acute pain of right knee ? ?PERTINENT HISTORY: Bil TKA 2019, Rt shoulder scope 06/2019, Rt total knee revision 08/26/21 ? ?PRECAUTIONS: None ? ?SUBJECTIVE: The MD adjusted my pain medicine and I slept a little better last night.  I was able to get 3 consecutive hours of sleep last night.  I am able to have longer periods between taking my pain medicine.  ?PAIN:  ?Are you having pain? Yes: NPRS scale: 5/10 ?Pain location: RT knee ?Pain description: Sore ?Aggravating factors: A lot of weightbearing ?Relieving factors: Ice. Meds maybe ? ?OBJECTIVE:  ?  ?PATIENT SURVEYS:  ?  FOTO 37 (goal is 67) ? ?PALPATION: ?Edema about the Rt knee, healing surgical incision, diffuse palpable tenderness.  ?  ?LE ROM: ?  ?Passive ROM Right ?08/31/2021 Left ?08/31/2021 Right ?09/05/21 Right ?09/12/21 Right ?09/21/21  ?Hip flexion         ?Hip extension         ?Hip abduction         ?Hip adduction         ?Hip internal rotation         ?Hip external rotation         ?Knee flexion 75 full 86 92 96  ?Knee extension -15 full 8 -8 -5  ?Ankle dorsiflexion         ?Ankle plantarflexion         ?Ankle inversion         ?Ankle eversion         ? (Blank rows = not tested)   Painful with all testing  ?  ?LE MMT: ?  ?MMT Right ?08/31/2021 Left ?08/31/2021  ?Hip flexion 3-/5 (SLR)    ?Hip extension      ?Hip abduction      ?Hip adduction      ?Hip internal rotation      ?Hip external rotation      ?Knee flexion 3+/5 full  ?Knee extension 3-/5 full  ?Ankle dorsiflexion 4+/5 full  ?Ankle plantarflexion   full  ?Ankle inversion   full  ?Ankle eversion   full  ? (Blank rows = not tested)  Too painful to test additional muscles. ?Poor quad set- gluteal compensation ?  ?GAIT: ?Distance walked: 100 ft ?Assistive device utilized: Single point cane ?Level of assistance: Modified independence ?Comments: Antalgic gait, reduced step length and time spent on Rt LE ?  ?TODAY'S TREATMENT: ?09/21/21: ?Nustep L6 x 8 min- PT present to discuss progress. ?Standing rockerboard x 3 minutes  ?Standing hamstring stretch 3x20 seconds  ?4" step-down-Rt onlyx 15, 6" step x10- verbal cues to reduce Rt hip adduction ?Standing on Rt leg- tap on low cone with the Lt- 10x each direction-forward, diagonal and side ?Sit to stand: x10 with focus on eccentric control.  ?SAQs 5" hold x20 ?Weightshifting on balance pad: x1 min side to side and 1 min with Rt leg anterior ?Heel slides seated with slider x10 ?MANUAL:elongation to Rt quads, hamstring and gastroc, scar and patellar mobs.  ?Game Ready x 10 min with medium compression 3 flakes ? ?09/19/21: ?Nustep L6 x 8 min- PT present to discuss progress. ?Standing rockerboard x 3 minutes  ?Standing hamstring stretch 3x20 seconds  ?4" step-down-Rt only 2x10 ?Sit to stand: x10 with focus on eccentric control.  ?SAQs 5" hold x20 ?Weightshifting on balance pad: x1 min side to side and 1 min with Rt leg anterior ?Heel slides seated with slider x10 ?MANUAL:elongation to Rt quads, hamstring and gastroc, scar and patellar mobs.  ?Game Ready x 10 min with medium compression 3 flakes ? ?09/14/21: ?Nustep L6 x 8 min- PT present to discuss progress. ?Standing rockerboard x 3 minutes  ?Seated  hamstring stretch 3x20 seconds  ?4" step-ups Rt only x10  ?SLR x10 with quad set ?SAQs 5" hold x20 ?Heel slides seated with slider x10 ?MANUAL:elongation to Rt quads, hamstring and gastroc, patellar mobs.  ?Game Ready x 10 min with medium compression 3 flakes ?09/12/21: ?Nustep L6 x 8 min- PT present to discuss progress. ?Standing rockerboardx 3 minutes  ?Seated hamstring stretch 3x20 seconds  ?Standing heel raises 2x10  for contract Rt quad ?Weightshifting on balance pad x 1 minute ?Sit to stand: 2x10 ?Heel slides supine with strap x10 ?MANUAL:elongation to Rt quads, hamstring and gastroc, patellar mobs.  ?Game Ready x 10 min with medium compression 3 flakes ? ? ?  ?PATIENT EDUCATION:  ?Education details: Access Code: [email or web address] ?Person educated: Patient ?Education method: Explanation, Demonstration, and Handouts ?Education comprehension: verbalized understanding and returned demonstration ?  ?HOME EXERCISE PROGRAM: ?Access Code: [email or web address] ?URL: [email or web address] ?Date: 08/31/2021 ?Prepared by: Tresa Endo ?  ?Exercises ?Supine Ankle Pumps - 3 x daily - 7 x weekly - 2 sets - 15 reps ?Supine Quadricep Sets - 3 x daily - 7 x weekly - 2 sets - 10 reps - 5 hold ?Supine Active Straight Leg Raise - 2 x daily - 7 x weekly - 1 sets - 10 reps ?Supine Short Arc Quad - 2 x daily - 7 x weekly - 3 sets - 15 reps ?Supine Heel Slides - 3 x daily - 7 x weekly - 1 sets - 10 reps ?Seated Long Arc Quad - 1 x daily - 7 x weekly - 3 sets - 15 reps ?Seated Knee Flexion Stretch - 1 x daily - 7 x weekly - 3 sets - 15 reps ?  ?Patient Education ?Total Knee Replacement Handout ?  ?ASSESSMENT: ?  ?CLINICAL IMPRESSION: ?Pt is making expected progress s/p TKA revision.  Pt had her pain meds adjusted and slept a little better last night.  Pt is also able to increase the time between taking pain meds during the day due to reduced pain,   Pt ambulated in the clinic without device and had min antalgia.  Pt reports that she is negotiating  steps with alternating pattern with ascending and step-to with descending. Pt with reduced eccentric control on the Rt and required UE support. Pt was able to advance to 6" step with step-down and PT cued

## 2021-09-26 ENCOUNTER — Other Ambulatory Visit (HOSPITAL_COMMUNITY): Payer: Self-pay

## 2021-09-26 ENCOUNTER — Encounter: Payer: Self-pay | Admitting: Physical Therapy

## 2021-09-26 ENCOUNTER — Ambulatory Visit: Payer: No Typology Code available for payment source | Admitting: Physical Therapy

## 2021-09-26 DIAGNOSIS — M6281 Muscle weakness (generalized): Secondary | ICD-10-CM

## 2021-09-26 DIAGNOSIS — M25561 Pain in right knee: Secondary | ICD-10-CM

## 2021-09-26 DIAGNOSIS — R600 Localized edema: Secondary | ICD-10-CM

## 2021-09-26 DIAGNOSIS — R2689 Other abnormalities of gait and mobility: Secondary | ICD-10-CM

## 2021-09-26 NOTE — Therapy (Signed)
?OUTPATIENT PHYSICAL THERAPY TREATMENT NOTE ? ? ?Patient Name: Samantha Curtis ?MRN: [id number] ?DOB:01-28-1966, 56 y.o., female ?Today's Date: 09/26/2021 ? ?PCP: Lavone Orn, MD ?REFERRING PROVIDER: Leandrew Koyanagi, MD ? ? PT End of Session - 09/26/21 1525   ? ? Visit Number 9   ? Date for PT Re-Evaluation 11/09/21   ? Authorization Type UMR- no visit limit   ? PT Start Time 1521   ? PT Stop Time 3646   ? PT Time Calculation (min) 44 min   ? Activity Tolerance Patient limited by pain   ? Behavior During Therapy [facility] for tasks assessed/performed   ? ?  ?  ? ?  ? ? ? ? ? ? ? ?Past Medical History:  ?Diagnosis Date  ? Arthritis   ? "knees" (07/04/2017)  ? Complication of anesthesia   ? Dysrhythmia   ? PALPITATIONS  ? GERD (gastroesophageal reflux disease)   ? High cholesterol   ? Leg pain   ? PONV (postoperative nausea and vomiting)   ? ?Past Surgical History:  ?Procedure Laterality Date  ? ENDOVENOUS ABLATION SAPHENOUS VEIN W/ LASER Left 2005  ? [facility]  ? JOINT REPLACEMENT    ? KNEE ARTHROSCOPY Right   ? SHOULDER ARTHROSCOPY Right   ? "spurs"  ? SHOULDER ARTHROSCOPY Right 06/24/2019  ? Procedure: RIGHT SHOULDER ARTHROSCOPY; CHONDROPLASTY AND DEBRIEDMENT;  Surgeon: Melrose Nakayama, MD;  Location: [facility];  Service: Orthopedics;  Laterality: Right;  ? TOTAL KNEE ARTHROPLASTY Bilateral 07/03/2017  ? Procedure: TOTAL KNEE BILATERAL;  Surgeon: Melrose Nakayama, MD;  Location: [place];  Service: Orthopedics;  Laterality: Bilateral;  ? TOTAL KNEE REVISION Right 08/26/2021  ? Procedure: RIGHT TOTAL KNEE REVISION;  Surgeon: Leandrew Koyanagi, MD;  Location: [place];  Service: Orthopedics;  Laterality: Right;  ? TUBAL LIGATION    ? VAGINAL HYSTERECTOMY    ? ?Patient Active Problem List  ? Diagnosis Date Noted  ? Loosening of prosthesis of right total knee replacement ([place]) 08/26/2021  ? Status post revision of total replacement of right knee 08/26/2021  ? Status post total right knee replacement 08/04/2021  ? Allergic rhinitis  04/27/2021  ? Body mass index (BMI) of 25.0 to 29.9 04/27/2021  ? Cardiomegaly 04/27/2021  ? Cough variant asthma 04/27/2021  ? Gastroduodenitis 04/27/2021  ? Osteoarthritis of knee 04/27/2021  ? Pure hypercholesterolemia 04/27/2021  ? Restless legs 04/27/2021  ? Ventricular premature depolarization 04/27/2021  ? S/P TKR (total knee replacement), bilateral   ? Gastroesophageal reflux disease   ? Post-operative pain   ? Elevated blood pressure reading   ? Bilateral primary osteoarthritis of knee 07/03/2017  ? Chest pain 08/31/2014  ? Palpitations 08/31/2014  ? Lateral epicondylitis of right elbow 04/02/2013  ? ? ?REFERRING DIAG: satus post total rt knee replacement (revision) ? ?THERAPY DIAG:  ?Muscle weakness (generalized) ? ?Other abnormalities of gait and mobility ? ?Localized edema ? ?Acute pain of right knee ? ?PERTINENT HISTORY: Bil TKA 2019, Rt shoulder scope 06/2019, Rt total knee revision 08/26/21 ? ?PRECAUTIONS: None ? ?SUBJECTIVE: I am hurting a lot today. Possibly I over did it yesterday.  ? ?Are you having pain? Yes: NPRS scale: 7/10 ?Pain location: RT knee ?Pain description: Sore ?Aggravating factors: A lot of weightbearing ?Relieving factors: Ice. Meds maybe ? ?OBJECTIVE:  ?  ?PATIENT SURVEYS:  ?FOTO 37 (goal is 23) ? ?PALPATION: ?Edema about the Rt knee, healing surgical incision, diffuse palpable tenderness.  ?  ?LE ROM: ?  ?Passive ROM Right ?  08/31/2021 Left ?08/31/2021 Right ?09/05/21 Right ?09/12/21 Right ?09/21/21  ?Hip flexion         ?Hip extension         ?Hip abduction         ?Hip adduction         ?Hip internal rotation         ?Hip external rotation         ?Knee flexion 75 full 86 92 96  ?Knee extension -15 full 8 -8 -5  ?Ankle dorsiflexion         ?Ankle plantarflexion         ?Ankle inversion         ?Ankle eversion         ? (Blank rows = not tested)  Painful with all testing  ?  ?LE MMT: ?  ?MMT Right ?08/31/2021 Left ?08/31/2021  ?Hip flexion 3-/5 (SLR)    ?Hip extension      ?Hip abduction       ?Hip adduction      ?Hip internal rotation      ?Hip external rotation      ?Knee flexion 3+/5 full  ?Knee extension 3-/5 full  ?Ankle dorsiflexion 4+/5 full  ?Ankle plantarflexion   full  ?Ankle inversion   full  ?Ankle eversion   full  ? (Blank rows = not tested)  Too painful to test additional muscles. ?Poor quad set- gluteal compensation ?  ?GAIT: ?Distance walked: 100 ft ?Assistive device utilized: Single point cane ?Level of assistance: Modified independence ?Comments: Antalgic gait, reduced step length and time spent on Rt LE ?  ? ?TODAY'S TREATMENT: ? ?09/26/21: ?Nustep L6 x 8 min- PTA present to discuss progress/pain assessment ?Heel slides seated with slider x20 ?Seated hamstring stretch 3x20 seconds ?LAQ 2# x10 3 1 sec hold: too painful, pt cried secondary to pain, stopped ex.  ?MANUAL:elongation to Rt quads, hamstring and gastroc, scar and patellar mobs ?Game Ready x 15 min with medium compression 3 flakes and IFC Estim for pain ? ? ?09/21/21: ?Nustep L6 x 8 min- PT present to discuss progress. ?Standing rockerboard x 3 minutes  ?Standing hamstring stretch 3x20 seconds  ?4" step-down-Rt onlyx 15, 6" step x10- verbal cues to reduce Rt hip adduction ?Standing on Rt leg- tap on low cone with the Lt- 10x each direction-forward, diagonal and side ?Sit to stand: x10 with focus on eccentric control.  ?SAQs 5" hold x20 ?Weightshifting on balance pad: x1 min side to side and 1 min with Rt leg anterior ?Heel slides seated with slider x10 ?MANUAL:elongation to Rt quads, hamstring and gastroc, scar and patellar mobs.  ?Game Ready x 10 min with medium compression 3 flakes ? ?09/19/21: ?Nustep L6 x 8 min- PT present to discuss progress. ?Standing rockerboard x 3 minutes  ?Standing hamstring stretch 3x20 seconds  ?4" step-down-Rt only 2x10 ?Sit to stand: x10 with focus on eccentric control.  ?SAQs 5" hold x20 ?Weightshifting on balance pad: x1 min side to side and 1 min with Rt leg anterior ?Heel slides seated with slider  x10 ?MANUAL:elongation to Rt quads, hamstring and gastroc, scar and patellar mobs.  ?Game Ready x 10 min with medium compression 3 flakes ? ?09/14/21: ?Nustep L6 x 8 min- PT present to discuss progress. ?Standing rockerboard x 3 minutes  ?Seated hamstring stretch 3x20 seconds  ?4" step-ups Rt only x10  ?SLR x10 with quad set ?SAQs 5" hold x20 ?Heel slides seated with slider x10 ?MANUAL:elongation to Rt quads,  hamstring and gastroc, patellar mobs.  ?Game Ready x 10 min with medium compression 3 flakes ? ?PATIENT EDUCATION:  ?Education details: Access Code: [email or web address] ?Person educated: Patient ?Education method: Explanation, Demonstration, and Handouts ?Education comprehension: verbalized understanding and returned demonstration ?  ?HOME EXERCISE PROGRAM: ?Access Code: [email or web address] ?URL: [email or web address] ?Date: 08/31/2021 ?Prepared by: Claiborne Billings ?  ?Exercises ?Supine Ankle Pumps - 3 x daily - 7 x weekly - 2 sets - 15 reps ?Supine Quadricep Sets - 3 x daily - 7 x weekly - 2 sets - 10 reps - 5 hold ?Supine Active Straight Leg Raise - 2 x daily - 7 x weekly - 1 sets - 10 reps ?Supine Short Arc Quad - 2 x daily - 7 x weekly - 3 sets - 15 reps ?Supine Heel Slides - 3 x daily - 7 x weekly - 1 sets - 10 reps ?Seated Long Arc Quad - 1 x daily - 7 x weekly - 3 sets - 15 reps ?Seated Knee Flexion Stretch - 1 x daily - 7 x weekly - 3 sets - 15 reps ?  ?Patient Education ?Total Knee Replacement Handout ?  ?ASSESSMENT: ?  ?CLINICAL IMPRESSION: ?Pt arrives with higher pain than last week and increased edema. Pt thinks possibly she over did the holiday yesterday but isn't sure. Pt began to cry due to pain levels. Game ready with E-stim applied to knee for pain reduction.  ? ?OBJECTIVE IMPAIRMENTS Abnormal gait, decreased activity tolerance, decreased balance, decreased endurance, difficulty walking, decreased ROM, decreased strength, increased edema, impaired flexibility, and pain.  ?   ?  ?GOALS: ?Goals reviewed with  patient? Yes ?  ?SHORT TERM GOALS: Target date: 10/05/2021 ?  ?Be independent in initial HEP ?Baseline:  ?Goal status: Met 09/05/21 ?  ?2.  Report < or = to 5/10 Rt knee pain with standing and walking to improve in

## 2021-09-28 ENCOUNTER — Telehealth: Payer: Self-pay

## 2021-09-28 ENCOUNTER — Ambulatory Visit: Payer: No Typology Code available for payment source

## 2021-09-28 DIAGNOSIS — M25561 Pain in right knee: Secondary | ICD-10-CM

## 2021-09-28 DIAGNOSIS — R2689 Other abnormalities of gait and mobility: Secondary | ICD-10-CM

## 2021-09-28 DIAGNOSIS — R600 Localized edema: Secondary | ICD-10-CM

## 2021-09-28 DIAGNOSIS — M6281 Muscle weakness (generalized): Secondary | ICD-10-CM | POA: Diagnosis not present

## 2021-09-28 NOTE — Telephone Encounter (Signed)
Eber Jones called to see if we were wanting to add code 37628 to pts surgery for 08/26/21 because she got a call from the facility asking to add this code. Morrison Old looked into for me. We billed [phone] (code that was precerted) and not [phone]. I called her back and advised, ?

## 2021-09-28 NOTE — Therapy (Signed)
?OUTPATIENT PHYSICAL THERAPY TREATMENT NOTE ? ? ?Patient Name: Samantha Curtis ?MRN: [id number] ?DOB:09-01-1965, 56 y.o., female ?Today's Date: 09/28/2021 ? ?PCP: Lavone Orn, MD ?REFERRING PROVIDER: Leandrew Koyanagi, MD ? ? PT End of Session - 09/28/21 1605   ? ? Visit Number 10   ? Date for PT Re-Evaluation 11/09/21   ? Authorization Type UMR- no visit limit   ? PT Start Time 0321   ? PT Stop Time 1615   ? PT Time Calculation (min) 44 min   ? Activity Tolerance Patient limited by pain   ? Behavior During Therapy [facility] for tasks assessed/performed   ? ?  ?  ? ?  ? ? ? ? ? ? ? ? ?Past Medical History:  ?Diagnosis Date  ? Arthritis   ? "knees" (07/04/2017)  ? Complication of anesthesia   ? Dysrhythmia   ? PALPITATIONS  ? GERD (gastroesophageal reflux disease)   ? High cholesterol   ? Leg pain   ? PONV (postoperative nausea and vomiting)   ? ?Past Surgical History:  ?Procedure Laterality Date  ? ENDOVENOUS ABLATION SAPHENOUS VEIN W/ LASER Left 2005  ? [facility]  ? JOINT REPLACEMENT    ? KNEE ARTHROSCOPY Right   ? SHOULDER ARTHROSCOPY Right   ? "spurs"  ? SHOULDER ARTHROSCOPY Right 06/24/2019  ? Procedure: RIGHT SHOULDER ARTHROSCOPY; CHONDROPLASTY AND DEBRIEDMENT;  Surgeon: Melrose Nakayama, MD;  [facility];  Service: Orthopedics;  Laterality: Right;  ? TOTAL KNEE ARTHROPLASTY Bilateral 07/03/2017  ? Procedure: TOTAL KNEE BILATERAL;  Surgeon: Melrose Nakayama, MD;  Location: [place];  Service: Orthopedics;  Laterality: Bilateral;  ? TOTAL KNEE REVISION Right 08/26/2021  ? Procedure: RIGHT TOTAL KNEE REVISION;  Surgeon: Leandrew Koyanagi, MD;  Location: [place];  Service: Orthopedics;  Laterality: Right;  ? TUBAL LIGATION    ? VAGINAL HYSTERECTOMY    ? ?Patient Active Problem List  ? Diagnosis Date Noted  ? Loosening of prosthesis of right total knee replacement ([place]) 08/26/2021  ? Status post revision of total replacement of right knee 08/26/2021  ? Status post total right knee replacement 08/04/2021  ? Allergic rhinitis  04/27/2021  ? Body mass index (BMI) of 25.0 to 29.9 04/27/2021  ? Cardiomegaly 04/27/2021  ? Cough variant asthma 04/27/2021  ? Gastroduodenitis 04/27/2021  ? Osteoarthritis of knee 04/27/2021  ? Pure hypercholesterolemia 04/27/2021  ? Restless legs 04/27/2021  ? Ventricular premature depolarization 04/27/2021  ? S/P TKR (total knee replacement), bilateral   ? Gastroesophageal reflux disease   ? Post-operative pain   ? Elevated blood pressure reading   ? Bilateral primary osteoarthritis of knee 07/03/2017  ? Chest pain 08/31/2014  ? Palpitations 08/31/2014  ? Lateral epicondylitis of right elbow 04/02/2013  ? ? ?REFERRING DIAG: satus post total rt knee replacement (revision) ? ?THERAPY DIAG:  ?Muscle weakness (generalized) ? ?Other abnormalities of gait and mobility ? ?Localized edema ? ?Acute pain of right knee ? ?PERTINENT HISTORY: Bil TKA 2019, Rt shoulder scope 06/2019, Rt total knee revision 08/26/21 ? ?PRECAUTIONS: None ? ?SUBJECTIVE:The pain is really bad.  I feel like I am set back 2 weeks  ? ?Are you having pain? Yes: NPRS scale: 7/10 ?Pain location: RT knee ?Pain description: Sore ?Aggravating factors: A lot of weightbearing ?Relieving factors: Ice. Meds maybe ? ?OBJECTIVE:  ?  ?PATIENT SURVEYS:  ?FOTO 37 (goal is 58) ? ?PALPATION: ?Edema about the Rt knee, healing surgical incision, diffuse palpable tenderness.  ?  ?LE ROM: ?  ?  Passive ROM Right ?08/31/2021 Left ?08/31/2021 Right ?09/05/21 Right ?09/12/21 Right ?09/21/21  ?Hip flexion         ?Hip extension         ?Hip abduction         ?Hip adduction         ?Hip internal rotation         ?Hip external rotation         ?Knee flexion 75 full 86 92 96  ?Knee extension -15 full 8 -8 -5  ?Ankle dorsiflexion         ?Ankle plantarflexion         ?Ankle inversion         ?Ankle eversion         ? (Blank rows = not tested)  Painful with all testing  ?  ?LE MMT: ?  ?MMT Right ?08/31/2021 Left ?08/31/2021  ?Hip flexion 3-/5 (SLR)    ?Hip extension      ?Hip abduction       ?Hip adduction      ?Hip internal rotation      ?Hip external rotation      ?Knee flexion 3+/5 full  ?Knee extension 3-/5 full  ?Ankle dorsiflexion 4+/5 full  ?Ankle plantarflexion   full  ?Ankle inversion   full  ?Ankle eversion   full  ? (Blank rows = not tested)  Too painful to test additional muscles. ?Poor quad set- gluteal compensation ?  ?GAIT: ?Distance walked: 100 ft ?Assistive device utilized: Single point cane ?Level of assistance: Modified independence ?Comments: Antalgic gait, reduced step length and time spent on Rt LE ?  ? ?TODAY'S TREATMENT: ? ?09/28/21: ?Nustep L6 x 8 min- PTA present to discuss progress/pain assessment ?Heel slides seated with slider x20 ?Seated hamstring stretch 3x20 seconds ?LAQ 0# x10 ?Ball squeeze: x10 ?Rockerboardx 1.5 min ?Heel raises x10 in standing  ?Game Ready x 15 min with medium compression 3 flakes  ? ?09/26/21: ?Nustep L6 x 8 min- PTA present to discuss progress/pain assessment ?Heel slides seated with slider x20 ?Seated hamstring stretch 3x20 seconds ?LAQ 2# x10 3 1 sec hold: too painful, pt cried secondary to pain, stopped ex.  ?MANUAL:elongation to Rt quads, hamstring and gastroc, scar and patellar mobs ?Game Ready x 15 min with medium compression 3 flakes and IFC Estim for pain ? ? ?09/21/21: ?Nustep L6 x 8 min- PT present to discuss progress. ?Standing rockerboard x 3 minutes  ?Standing hamstring stretch 3x20 seconds  ?4" step-down-Rt onlyx 15, 6" step x10- verbal cues to reduce Rt hip adduction ?Standing on Rt leg- tap on low cone with the Lt- 10x each direction-forward, diagonal and side ?Sit to stand: x10 with focus on eccentric control.  ?SAQs 5" hold x20 ?Weightshifting on balance pad: x1 min side to side and 1 min with Rt leg anterior ?Heel slides seated with slider x10 ?MANUAL:elongation to Rt quads, hamstring and gastroc, scar and patellar mobs.  ?Game Ready x 10 min with medium compression 3 flakes ? ? ? ?PATIENT EDUCATION:  ?Education details: Access Code:  [email or web address] ?Person educated: Patient ?Education method: Explanation, Demonstration, and Handouts ?Education comprehension: verbalized understanding and returned demonstration ?  ?HOME EXERCISE PROGRAM: ?Access Code: [email or web address] ?URL: [email or web address] ?Date: 08/31/2021 ?Prepared by: Claiborne Billings ?  ?Exercises ?Supine Ankle Pumps - 3 x daily - 7 x weekly - 2 sets - 15 reps ?Supine Quadricep Sets - 3 x daily - 7 x weekly - 2 sets - 10 reps - 5 hold ?  Supine Active Straight Leg Raise - 2 x daily - 7 x weekly - 1 sets - 10 reps ?Supine Short Arc Quad - 2 x daily - 7 x weekly - 3 sets - 15 reps ?Supine Heel Slides - 3 x daily - 7 x weekly - 1 sets - 10 reps ?Seated Long Arc Quad - 1 x daily - 7 x weekly - 3 sets - 15 reps ?Seated Knee Flexion Stretch - 1 x daily - 7 x weekly - 3 sets - 15 reps ?  ?Patient Education ?Total Knee Replacement Handout ?  ?ASSESSMENT: ?  ?CLINICAL IMPRESSION: ?Pt reports significant fatigue this week and rates the pain as 7/10.  Pt has tried to do her HEP and walking as able.  Pt is not sleeping well due to pain and was tearful throughout session due to pain.  PT encouraged pt to reach out to MD again regarding high pain levels.  Pt tolerated gentle exercise in the clinic today and was monitored by PT for pain.   ? ?OBJECTIVE IMPAIRMENTS Abnormal gait, decreased activity tolerance, decreased balance, decreased endurance, difficulty walking, decreased ROM, decreased strength, increased edema, impaired flexibility, and pain.  ?   ?  ?GOALS: ?Goals reviewed with patient? Yes ?  ?SHORT TERM GOALS: Target date: 10/05/2021 ?  ?Be independent in initial HEP ?Baseline:  ?Goal status: Met 09/05/21 ?  ?2.  Report < or = to 5/10 Rt knee pain with standing and walking to improve independence at home ?Baseline: 7/10 (09/07/21) ?Goal status: in progress ?  ?3.  Demonstrate Rt knee A/ROM flexion to > or = to 95 degrees to improve sitting and steps with less compensation ?Baseline: 96 (09/21/21) ?Goal status:  MET ?  ?4.  Demonstrate Rt knee A/ROM extension to lacking < or  = to 8 degrees to normalize gait pattern ?Baseline: 5 (09/21/21) ?Goal status: Met 09/05/21  ?5.  Tolerate standing and walking > or = to 10-1

## 2021-10-07 ENCOUNTER — Encounter: Payer: Self-pay | Admitting: Orthopaedic Surgery

## 2021-10-07 ENCOUNTER — Ambulatory Visit (INDEPENDENT_AMBULATORY_CARE_PROVIDER_SITE_OTHER): Payer: No Typology Code available for payment source | Admitting: Orthopaedic Surgery

## 2021-10-07 ENCOUNTER — Other Ambulatory Visit (HOSPITAL_COMMUNITY): Payer: Self-pay

## 2021-10-07 ENCOUNTER — Ambulatory Visit (INDEPENDENT_AMBULATORY_CARE_PROVIDER_SITE_OTHER): Payer: No Typology Code available for payment source

## 2021-10-07 DIAGNOSIS — Z96651 Presence of right artificial knee joint: Secondary | ICD-10-CM | POA: Diagnosis not present

## 2021-10-07 MED ORDER — TRAMADOL HCL 50 MG PO TABS
50.0000 mg | ORAL_TABLET | Freq: Every evening | ORAL | 0 refills | Status: DC | PRN
  Filled 2021-10-07: qty 30, 15d supply, fill #0

## 2021-10-07 MED ORDER — HYDROMORPHONE HCL 2 MG PO TABS
2.0000 mg | ORAL_TABLET | Freq: Every evening | ORAL | 0 refills | Status: AC | PRN
  Filled 2021-10-07: qty 20, 20d supply, fill #0

## 2021-10-07 MED ORDER — METHOCARBAMOL 500 MG PO TABS
500.0000 mg | ORAL_TABLET | Freq: Four times a day (QID) | ORAL | 2 refills | Status: DC | PRN
  Filled 2021-10-07: qty 30, 8d supply, fill #0
  Filled 2021-10-30: qty 30, 8d supply, fill #1

## 2021-10-07 MED ORDER — PREDNISONE 10 MG (21) PO TBPK
ORAL_TABLET | ORAL | 3 refills | Status: AC
  Filled 2021-10-07: qty 21, 6d supply, fill #0

## 2021-10-07 MED ORDER — AMOXICILLIN 500 MG PO CAPS
2000.0000 mg | ORAL_CAPSULE | Freq: Once | ORAL | 6 refills | Status: AC
  Filled 2021-10-07: qty 4, 1d supply, fill #0

## 2021-10-07 NOTE — Progress Notes (Signed)
? ?Post-Op Visit Note ?  ?Patient: Samantha Curtis           ?Date of Birth: 01/25/1966           ?MRN: [id number] ?Visit Date: 10/07/2021 ?PCP: Samantha Funk, MD ? ? ?Assessment & Plan: ? ?Chief Complaint:  ?Chief Complaint  ?Patient presents with  ? Right Knee - Routine Post Op, Pain  ? ?Visit Diagnoses:  ?1. Status post revision of total replacement of right knee   ? ? ?Plan: Samantha Curtis returns today approximately 6 weeks status post revision of the femoral component.  She feels a lot of pain due to swelling that makes her feel like the knee wants to explode.  She is having trouble sleeping at night.  She takes Advil and Tylenol and Robaxin during the day and Dilaudid and tramadol at night as needed.  She denies any constitutional symptoms.  Her range of motion is progressing very well. ? ?Examination of the right knee shows a fully healed surgical scar.  No evidence of infection.  Range of motion is very good.  Stable to varus valgus. ? ?I feel that Samantha Curtis is progressing through physical therapy quite well.  Fortunately she has had a a lot of difficulty with postoperative swelling.  The skin is too sensitive for any compression socks.  She uses Samantha Curtis regularly throughout the day.  I refilled her medications.  I would like to try a prednisone Dosepak to see if this will help with the inflammation.  She is having too much swelling and pain related to the swelling for her to go back to work in a couple weeks.  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  Therefore I would hold her out of work for at least another 6 weeks recover some more.  We will reassess at that time. ? ?Follow-Up Instructions: No follow-ups on file.  ? ?Orders:  ?Orders Placed This Encounter  ?Procedures  ? XR KNEE 3 VIEW RIGHT  ? ?Meds ordered this encounter  ?Medications  ? predniSONE (STERAPRED UNI-PAK 21 TAB) 10 MG (21) TBPK tablet  ?  Sig: Take as directed  ?  Dispense:  21 tablet  ?  Refill:  3   ? traMADol (ULTRAM) 50 MG tablet  ?  Sig: Take 1-2 tablets (50-100 mg total) by mouth at bedtime as needed.  ?  Dispense:  30 tablet  ?  Refill:  0  ? HYDROmorphone (DILAUDID) 2 MG tablet  ?  Sig: Take 1 tablet (2 mg total) by mouth at bedtime as needed for severe pain.  ?  Dispense:  20 tablet  ?  Refill:  0  ? methocarbamol (ROBAXIN) 500 MG tablet  ?  Sig: Take 1 tablet (500 mg total) by mouth every 6 (six) hours as needed for muscle spasms.  ?  Dispense:  30 tablet  ?  Refill:  2  ? amoxicillin (AMOXIL) 500 MG capsule  ?  Sig: Take 4 capsules (2,000 mg total) by mouth once for 1 dose.  ?  Dispense:  4 capsule  ?  Refill:  6  ? ? ?Imaging: ?XR KNEE 3 VIEW RIGHT ? ?Result Date: 10/07/2021 ?Status post right revision femoral component without any complications.  ? ?PMFS History: ?Patient Active Problem List  ? Diagnosis Date Noted  ? 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 Status post revision of total replacement of  right knee 08/26/2021  ? Status post total right knee replacement 08/04/2021  ? Allergic rhinitis 04/27/2021  ? Body mass index (BMI) of 25.0 to 29.9 04/27/2021  ? Cardiomegaly 04/27/2021  ? Cough variant asthma 04/27/2021  ? Gastroduodenitis 04/27/2021  ? Osteoarthritis of knee 04/27/2021  ? Pure hypercholesterolemia 04/27/2021  ? Restless legs 04/27/2021  ? Ventricular premature depolarization 04/27/2021  ? S/P TKR (total knee replacement), bilateral   ? Gastroesophageal reflux disease   ? Post-operative pain   ? Elevated blood pressure reading   ? Bilateral primary osteoarthritis of knee 07/03/2017  ? Chest pain 08/31/2014  ? Palpitations 08/31/2014  ? Lateral epicondylitis of right elbow 04/02/2013  ? ?Past Medical History:  ?Diagnosis Date  ? Arthritis   ? "knees" (07/04/2017)  ? Complication of anesthesia   ? Dysrhythmia   ? PALPITATIONS  ? GERD (gastroesophageal reflux disease)   ? High cholesterol   ? Leg pain   ? PONV (postoperative nausea and vomiting)   ?   ?Family History  ?Problem Relation Age of Onset  ? Dementia Mother   ?  ?Past Surgical History:  ?Procedure Laterality Date  ? ENDOVENOUS ABLATION SAPHENOUS VEIN W/ LASER Left 2005  ? [facility]  ? JOINT REPLACEMENT    ? KNEE ARTHROSCOPY Right   ? SHOULDER ARTHROSCOPY Right   ? "spurs"  ? SHOULDER ARTHROSCOPY Right 06/24/2019  ? Procedure: RIGHT SHOULDER ARTHROSCOPY; CHONDROPLASTY AND DEBRIEDMENT;  Surgeon: Marcene Corning, MD;  Location: [facility];  Service: Orthopedics;  Laterality: Right;  ? TOTAL KNEE ARTHROPLASTY Bilateral 07/03/2017  ? Procedure: TOTAL KNEE BILATERAL;  Surgeon: Marcene Corning, MD;  Location: [facility];  Service: Orthopedics;  Laterality: Bilateral;  ? TOTAL KNEE REVISION Right 08/26/2021  ? Procedure: RIGHT TOTAL KNEE REVISION;  Surgeon: Tarry Kos, MD;  Location: [facility];  Service: Orthopedics;  Laterality: Right;  ? TUBAL LIGATION    ? VAGINAL HYSTERECTOMY    ? ?Social History  ? ?Occupational History  ? Not on file  ?Tobacco Use  ? Smoking status: Never  ? Smokeless tobacco: Never  ?Vaping Use  ? Vaping Use: Never used  ?Substance and Sexual Activity  ? Alcohol use: No  ? Drug use: No  ? Sexual activity: Yes  ? ? ? ?

## 2021-10-10 ENCOUNTER — Encounter: Payer: Self-pay | Admitting: Physical Therapy

## 2021-10-10 ENCOUNTER — Ambulatory Visit: Payer: No Typology Code available for payment source | Admitting: Physical Therapy

## 2021-10-10 DIAGNOSIS — M6281 Muscle weakness (generalized): Secondary | ICD-10-CM | POA: Diagnosis not present

## 2021-10-10 DIAGNOSIS — R2689 Other abnormalities of gait and mobility: Secondary | ICD-10-CM

## 2021-10-10 DIAGNOSIS — R600 Localized edema: Secondary | ICD-10-CM

## 2021-10-10 DIAGNOSIS — M25561 Pain in right knee: Secondary | ICD-10-CM

## 2021-10-10 NOTE — Therapy (Signed)
?OUTPATIENT PHYSICAL THERAPY TREATMENT NOTE ? ? ?Patient Name: Samantha Curtis ?MRN: [id number] ?DOB:10-Jun-1966, 56 y.o., female ?Today's Date: 10/10/2021 ? ?PCP: Lavone Orn, MD ?REFERRING PROVIDER: Lavone Orn, MD ? ? PT End of Session - 10/10/21 1530   ? ? Visit Number 11   ? Date for PT Re-Evaluation 11/09/21   ? Authorization Type UMR- no visit limit   ? PT Start Time 1530   ? Activity Tolerance Patient limited by pain   ? Behavior During Therapy [facility] for tasks assessed/performed   ? ?  ?  ? ?  ? ? ? ? ? ? ? ? ? ?Past Medical History:  ?Diagnosis Date  ? Arthritis   ? "knees" (07/04/2017)  ? Complication of anesthesia   ? Dysrhythmia   ? PALPITATIONS  ? GERD (gastroesophageal reflux disease)   ? High cholesterol   ? Leg pain   ? PONV (postoperative nausea and vomiting)   ? ?Past Surgical History:  ?Procedure Laterality Date  ? ENDOVENOUS ABLATION SAPHENOUS VEIN W/ LASER Left 2005  ? [facility]  ? JOINT REPLACEMENT    ? KNEE ARTHROSCOPY Right   ? SHOULDER ARTHROSCOPY Right   ? "spurs"  ? SHOULDER ARTHROSCOPY Right 06/24/2019  ? Procedure: RIGHT SHOULDER ARTHROSCOPY; CHONDROPLASTY AND DEBRIEDMENT;  Surgeon: Melrose Nakayama, MD;  Location: [facility];  Service: Orthopedics;  Laterality: Right;  ? TOTAL KNEE ARTHROPLASTY Bilateral 07/03/2017  ? Procedure: TOTAL KNEE BILATERAL;  Surgeon: Melrose Nakayama, MD;  Location: [place];  Service: Orthopedics;  Laterality: Bilateral;  ? TOTAL KNEE REVISION Right 08/26/2021  ? Procedure: RIGHT TOTAL KNEE REVISION;  Surgeon: Leandrew Koyanagi, MD;  Location: [place];  Service: Orthopedics;  Laterality: Right;  ? TUBAL LIGATION    ? VAGINAL HYSTERECTOMY    ? ?Patient Active Problem List  ? Diagnosis Date Noted  ? Loosening of prosthesis of right total knee replacement ([place]) 08/26/2021  ? Status post revision of total replacement of right knee 08/26/2021  ? Status post total right knee replacement 08/04/2021  ? Allergic rhinitis 04/27/2021  ? Body mass index (BMI) of 25.0 to 29.9 04/27/2021   ? Cardiomegaly 04/27/2021  ? Cough variant asthma 04/27/2021  ? Gastroduodenitis 04/27/2021  ? Osteoarthritis of knee 04/27/2021  ? Pure hypercholesterolemia 04/27/2021  ? Restless legs 04/27/2021  ? Ventricular premature depolarization 04/27/2021  ? S/P TKR (total knee replacement), bilateral   ? Gastroesophageal reflux disease   ? Post-operative pain   ? Elevated blood pressure reading   ? Bilateral primary osteoarthritis of knee 07/03/2017  ? Chest pain 08/31/2014  ? Palpitations 08/31/2014  ? Lateral epicondylitis of right elbow 04/02/2013  ? ? ?REFERRING DIAG: satus post total rt knee replacement (revision) ? ?THERAPY DIAG:  ?Muscle weakness (generalized) ? ?Other abnormalities of gait and mobility ? ?Localized edema ? ?Acute pain of right knee ? ?PERTINENT HISTORY: Bil TKA 2019, Rt shoulder scope 06/2019, Rt total knee revision 08/26/21 ? ?PRECAUTIONS: None ? ?SUBJECTIVE: MD put pt on prendnisone but pain is still there. MD says there was a lot of soft tissue damage and to be patient.  ? ?Are you having pain? Yes: NPRS scale: 7/10 ?Pain location: RT knee ?Pain description: Sore ?Aggravating factors: A lot of weightbearing ?Relieving factors: Ice. Meds maybe ? ?OBJECTIVE:  ?  ?PATIENT SURVEYS:  ?FOTO 37 (goal is 14) ? ?PALPATION: ?Edema about the Rt knee, healing surgical incision, diffuse palpable tenderness.  ?  ?LE ROM: ?  ?Passive ROM Right ?08/31/2021 Left ?08/31/2021  Right ?09/05/21 Right ?09/12/21 Right ?09/21/21 Right ?10/10/21  ?Hip flexion          ?Hip extension          ?Hip abduction          ?Hip adduction          ?Hip internal rotation          ?Hip external rotation          ?Knee flexion 75 full 86 92 96 103  ?Knee extension -15 full 8 -8 -5 -4  ?Ankle dorsiflexion          ?Ankle plantarflexion          ?Ankle inversion          ?Ankle eversion          ? (Blank rows = not tested)  Painful with all testing  ?  ?LE MMT: ?  ?MMT Right ?08/31/2021 Left ?08/31/2021  ?Hip flexion 3-/5 (SLR)    ?Hip  extension      ?Hip abduction      ?Hip adduction      ?Hip internal rotation      ?Hip external rotation      ?Knee flexion 3+/5 full  ?Knee extension 3-/5 full  ?Ankle dorsiflexion 4+/5 full  ?Ankle plantarflexion   full  ?Ankle inversion   full  ?Ankle eversion   full  ? (Blank rows = not tested)  Too painful to test additional muscles. ?Poor quad set- gluteal compensation ?  ?GAIT: ?Distance walked: 100 ft ?Assistive device utilized: Single point cane ?Level of assistance: Modified independence ?Comments: Antalgic gait, reduced step length and time spent on Rt LE ?  ? ?TODAY'S TREATMENT: ? ?10/10/21:  ?Nustep L1 x 5 min ?Hooklying ball squeeze 10x ?Tried SLR: stopped too painful medially ?LAQ 0# 10x ?LT toe taps to step while SLS on RTLE 10x ?6" step ups 5x with 1 rail ?Game Ready x 10 min with medium compression 3 flakes  ?09/28/21: ?Nustep L6 x 8 min- PTA present to discuss progress/pain assessment ?Heel slides seated with slider x20 ?Seated hamstring stretch 3x20 seconds ?LAQ 0# x10 ?Ball squeeze: x10 ?Rockerboardx 1.5 min ?Heel raises x10 in standing  ?Game Ready x 15 min with medium compression 3 flakes  ? ?09/26/21: ?Nustep L6 x 8 min- PTA present to discuss progress/pain assessment ?Heel slides seated with slider x20 ?Seated hamstring stretch 3x20 seconds ?LAQ 2# x10 3 1 sec hold: too painful, pt cried secondary to pain, stopped ex.  ?MANUAL:elongation to Rt quads, hamstring and gastroc, scar and patellar mobs ?Game Ready x 15 min with medium compression 3 flakes and IFC Estim for pain ? ? ?09/21/21: ?Nustep L6 x 8 min- PT present to discuss progress. ?Standing rockerboard x 3 minutes  ?Standing hamstring stretch 3x20 seconds  ?4" step-down-Rt onlyx 15, 6" step x10- verbal cues to reduce Rt hip adduction ?Standing on Rt leg- tap on low cone with the Lt- 10x each direction-forward, diagonal and side ?Sit to stand: x10 with focus on eccentric control.  ?SAQs 5" hold x20 ?Weightshifting on balance pad: x1 min  side to side and 1 min with Rt leg anterior ?Heel slides seated with slider x10 ?MANUAL:elongation to Rt quads, hamstring and gastroc, scar and patellar mobs.  ?Game Ready x 10 min with medium compression 3 flakes ? ? ? ?PATIENT EDUCATION:  ?Education details: Access Code: [email or web address] ?Person educated: Patient ?Education method: Explanation, Demonstration, and Handouts ?Education comprehension: verbalized understanding and returned  demonstration ?  ?HOME EXERCISE PROGRAM: ?Access Code: [email or web address] ?URL: [email or web address] ?Date: 08/31/2021 ?Prepared by: Claiborne Billings ?  ?Exercises ?Supine Ankle Pumps - 3 x daily - 7 x weekly - 2 sets - 15 reps ?Supine Quadricep Sets - 3 x daily - 7 x weekly - 2 sets - 10 reps - 5 hold ?Supine Active Straight Leg Raise - 2 x daily - 7 x weekly - 1 sets - 10 reps ?Supine Short Arc Quad - 2 x daily - 7 x weekly - 3 sets - 15 reps ?Supine Heel Slides - 3 x daily - 7 x weekly - 1 sets - 10 reps ?Seated Long Arc Quad - 1 x daily - 7 x weekly - 3 sets - 15 reps ?Seated Knee Flexion Stretch - 1 x daily - 7 x weekly - 3 sets - 15 reps ?  ?Patient Education ?Total Knee Replacement Handout ?  ?ASSESSMENT: ?  ?CLINICAL IMPRESSION: ?Pt reports seeing MD. MD advised pt to take last week off of PT and rest knee. MD also put pt on 7 day prednisone dose pack. Pain slightly better but edema has lessened greatly. Pt AROM continues to improve. Pt still battles soft tissue pain with certain exercises.   ? ?OBJECTIVE IMPAIRMENTS Abnormal gait, decreased activity tolerance, decreased balance, decreased endurance, difficulty walking, decreased ROM, decreased strength, increased edema, impaired flexibility, and pain.  ?   ?  ?GOALS: ?Goals reviewed with patient? Yes ?  ?SHORT TERM GOALS: Target date: 10/05/2021 ?  ?Be independent in initial HEP ?Baseline:  ?Goal status: Met 09/05/21 ?  ?2.  Report < or = to 5/10 Rt knee pain with standing and walking to improve independence at home ?Baseline: 7/10  (09/07/21) ?Goal status: in progress ?  ?3.  Demonstrate Rt knee A/ROM flexion to > or = to 95 degrees to improve sitting and steps with less compensation ?Baseline: 96 (09/21/21) ?Goal status: MET ?  ?4.  Demonstrate Rt

## 2021-10-12 ENCOUNTER — Ambulatory Visit: Payer: No Typology Code available for payment source

## 2021-10-12 DIAGNOSIS — M6281 Muscle weakness (generalized): Secondary | ICD-10-CM

## 2021-10-12 DIAGNOSIS — R600 Localized edema: Secondary | ICD-10-CM

## 2021-10-12 DIAGNOSIS — M25561 Pain in right knee: Secondary | ICD-10-CM

## 2021-10-12 DIAGNOSIS — R2689 Other abnormalities of gait and mobility: Secondary | ICD-10-CM

## 2021-10-12 NOTE — Therapy (Signed)
?OUTPATIENT PHYSICAL THERAPY TREATMENT NOTE ? ? ?Patient Name: Samantha Curtis ?MRN: [id number] ?DOB:[birth year], 56 y.o., female ?Today's Date: 10/12/2021 ? ?PCP: Lavone Orn, MD ?REFERRING PROVIDER: Lavone Orn, MD ? ? PT End of Session - 10/12/21 1542   ? ? Visit Number 12   ? Date for PT Re-Evaluation 11/09/21   ? Authorization Type UMR- no visit limit   ? PT Start Time 0277   ? PT Stop Time 4128   ? PT Time Calculation (min) 41 min   ? Activity Tolerance Patient limited by pain;Patient tolerated treatment well   ? Behavior During Therapy [facility] for tasks assessed/performed   ? ?  ?  ? ?  ? ? ? ? ? ? ? ? ? ? ?Past Medical History:  ?Diagnosis Date  ? Arthritis   ? "knees" (07/04/2017)  ? Complication of anesthesia   ? Dysrhythmia   ? PALPITATIONS  ? GERD (gastroesophageal reflux disease)   ? High cholesterol   ? Leg pain   ? PONV (postoperative nausea and vomiting)   ? ?Past Surgical History:  ?Procedure Laterality Date  ? ENDOVENOUS ABLATION SAPHENOUS VEIN W/ LASER Left 2005  ? [facility]  ? JOINT REPLACEMENT    ? KNEE ARTHROSCOPY Right   ? SHOULDER ARTHROSCOPY Right   ? "spurs"  ? SHOULDER ARTHROSCOPY Right 06/24/2019  ? Procedure: RIGHT SHOULDER ARTHROSCOPY; CHONDROPLASTY AND DEBRIEDMENT;  Surgeon: Melrose Nakayama, MD;  Location: [facility];  Service: Orthopedics;  Laterality: Right;  ? TOTAL KNEE ARTHROPLASTY Bilateral 07/03/2017  ? Procedure: TOTAL KNEE BILATERAL;  Surgeon: Melrose Nakayama, MD;  Location: [place];  Service: Orthopedics;  Laterality: Bilateral;  ? TOTAL KNEE REVISION Right 08/26/2021  ? Procedure: RIGHT TOTAL KNEE REVISION;  Surgeon: Leandrew Koyanagi, MD;  Location: [place];  Service: Orthopedics;  Laterality: Right;  ? TUBAL LIGATION    ? VAGINAL HYSTERECTOMY    ? ?Patient Active Problem List  ? Diagnosis Date Noted  ? Loosening of prosthesis of right total knee replacement ([place]) 08/26/2021  ? Status post revision of total replacement of right knee 08/26/2021  ? Status post total right knee replacement  08/04/2021  ? Allergic rhinitis 04/27/2021  ? Body mass index (BMI) of 25.0 to 29.9 04/27/2021  ? Cardiomegaly 04/27/2021  ? Cough variant asthma 04/27/2021  ? Gastroduodenitis 04/27/2021  ? Osteoarthritis of knee 04/27/2021  ? Pure hypercholesterolemia 04/27/2021  ? Restless legs 04/27/2021  ? Ventricular premature depolarization 04/27/2021  ? S/P TKR (total knee replacement), bilateral   ? Gastroesophageal reflux disease   ? Post-operative pain   ? Elevated blood pressure reading   ? Bilateral primary osteoarthritis of knee 07/03/2017  ? Chest pain 08/31/2014  ? Palpitations 08/31/2014  ? Lateral epicondylitis of right elbow 04/02/2013  ? ? ?REFERRING DIAG: satus post total rt knee replacement (revision) ? ?THERAPY DIAG:  ?Muscle weakness (generalized) ? ?Other abnormalities of gait and mobility ? ?Localized edema ? ?Acute pain of right knee ? ?PERTINENT HISTORY: Bil TKA 2019, Rt shoulder scope 06/2019, Rt total knee revision 08/26/21 ? ?PRECAUTIONS: None ? ?SUBJECTIVE: I'm in pain after going to lunch with my friend.  I just took a pain pill.  I rode the bike for 10 min this morning.   ? ?Are you having pain? Yes: NPRS scale: 6/10 ?Pain location: RT knee ?Pain description: Sore ?Aggravating factors: A lot of weightbearing ?Relieving factors: Ice. Meds maybe ? ?OBJECTIVE:  ?  ?PATIENT SURVEYS:  ?FOTO 37 (goal is 24) ? ?PALPATION: ?  Edema about the Rt knee, healing surgical incision, diffuse palpable tenderness.  ?  ?LE ROM: ?  ?Passive ROM Right ?08/31/2021 Left ?08/31/2021 Right ?09/05/21 Right ?09/12/21 Right ?09/21/21 Right ?10/10/21  ?Hip flexion          ?Hip extension          ?Hip abduction          ?Hip adduction          ?Hip internal rotation          ?Hip external rotation          ?Knee flexion 75 full 86 92 96 103  ?Knee extension -15 full 8 -8 -5 -4  ?Ankle dorsiflexion          ?Ankle plantarflexion          ?Ankle inversion          ?Ankle eversion          ? (Blank rows = not tested)  Painful with all  testing  ?  ?LE MMT: ?  ?MMT Right ?08/31/2021 Left ?08/31/2021  ?Hip flexion 3-/5 (SLR)    ?Hip extension      ?Hip abduction      ?Hip adduction      ?Hip internal rotation      ?Hip external rotation      ?Knee flexion 3+/5 full  ?Knee extension 3-/5 full  ?Ankle dorsiflexion 4+/5 full  ?Ankle plantarflexion   full  ?Ankle inversion   full  ?Ankle eversion   full  ? (Blank rows = not tested)  Too painful to test additional muscles. ?Poor quad set- gluteal compensation ?  ?GAIT: ?Distance walked: 100 ft ?Assistive device utilized: Single point cane ?Level of assistance: Modified independence ?Comments: Antalgic gait, reduced step length and time spent on Rt LE ?  ? ?TODAY'S TREATMENT: ?10/10/21:  ?Nustep L1 x 5 min ?Hooklying ball squeeze 10x ?Tried SLR: 6 reps with pain ?LAQ 0# 10x ?Seated hamstring curls: green x10 on Rt ?LT toe taps to step while SLS on RTLE 10x ?6" step ups 5x with 1 rail ?Game Ready x 10 min with medium compression 3 flakes  ? ?10/10/21:  ?Nustep L1 x 5 min ?Hooklying ball squeeze 10x ?Tried SLR: stopped too painful medially ?LAQ 0# 10x ?LT toe taps to step while SLS on RTLE 10x ?6" step ups 5x with 1 rail ?Game Ready x 10 min with medium compression 3 flakes  ?09/28/21: ?Nustep L6 x 8 min- PTA present to discuss progress/pain assessment ?Heel slides seated with slider x20 ?Seated hamstring stretch 3x20 seconds ?LAQ 0# x10 ?Ball squeeze: x10 ?Rockerboardx 1.5 min ?Heel raises x10 in standing  ?Game Ready x 15 min with medium compression 3 flakes  ? ? ? ?PATIENT EDUCATION:  ?Education details: Access Code: [email or web address] ?Person educated: Patient ?Education method: Explanation, Demonstration, and Handouts ?Education comprehension: verbalized understanding and returned demonstration ?  ?HOME EXERCISE PROGRAM: ?Access Code: [email or web address] ?URL: [email or web address] ?Date: 08/31/2021 ?Prepared by: Claiborne Billings ?  ?Exercises ?Supine Ankle Pumps - 3 x daily - 7 x weekly - 2 sets - 15 reps ?Supine Quadricep  Sets - 3 x daily - 7 x weekly - 2 sets - 10 reps - 5 hold ?Supine Active Straight Leg Raise - 2 x daily - 7 x weekly - 1 sets - 10 reps ?Supine Short Arc Quad - 2 x daily - 7 x weekly - 3 sets - 15 reps ?Supine Heel Slides - 3 x daily - 7  x weekly - 1 sets - 10 reps ?Seated Long Arc Quad - 1 x daily - 7 x weekly - 3 sets - 15 reps ?Seated Knee Flexion Stretch - 1 x daily - 7 x weekly - 3 sets - 15 reps ?  ?Patient Education ?Total Knee Replacement Handout ?  ?ASSESSMENT: ?  ?CLINICAL IMPRESSION: ?Pt took last week off due to pain and MD advise.  Pt took prednisone and rested.  Pt returned to PT this week and did well last session. Edema is better overall today. A/ROM was improved when measured last session.  Pt with soft tissue pain with exercises and MD has assured her this is to be expected after revision surgery.  PT monitored pt during session for pain and exercises were stopped if painful.  Pt will continue to benefit from skilled PT to address Rt knee strength, flexibility and endurance.   ? ?OBJECTIVE IMPAIRMENTS Abnormal gait, decreased activity tolerance, decreased balance, decreased endurance, difficulty walking, decreased ROM, decreased strength, increased edema, impaired flexibility, and pain.  ?   ?  ?GOALS: ?Goals reviewed with patient? Yes ?  ?SHORT TERM GOALS: Target date: 10/05/2021 ?  ?Be independent in initial HEP ?Baseline:  ?Goal status: Met 09/05/21 ?  ?2.  Report < or = to 5/10 Rt knee pain with standing and walking to improve independence at home ?Baseline: 7/10 (09/07/21) ?Goal status: in progress ?  ?3.  Demonstrate Rt knee A/ROM flexion to > or = to 95 degrees to improve sitting and steps with less compensation ?Baseline: 96 (09/21/21) ?Goal status: MET ?  ?4.  Demonstrate Rt knee A/ROM extension to lacking < or  = to 8 degrees to normalize gait pattern ?Baseline: 5 (09/21/21) ?Goal status: Met 09/05/21  ?5.  Tolerate standing and walking > or = to 10-15 minutes for home tasks  ?Baseline: 30-45  min with pain, 15 minutes comfortably (09/21/21) ?Goal status: MET ?  ?  ?LONG TERM GOALS: Target date: 11/09/2021 ?  ?Be independent in advanced HEP ?Baseline:  ?Goal status: INITIAL ?  ?2.  Improve FOTO to >

## 2021-10-17 ENCOUNTER — Other Ambulatory Visit (HOSPITAL_COMMUNITY): Payer: Self-pay

## 2021-10-17 ENCOUNTER — Encounter: Payer: No Typology Code available for payment source | Admitting: Physical Therapy

## 2021-10-18 ENCOUNTER — Encounter: Payer: Self-pay | Admitting: Orthopaedic Surgery

## 2021-10-19 ENCOUNTER — Other Ambulatory Visit (HOSPITAL_COMMUNITY): Payer: Self-pay

## 2021-10-19 ENCOUNTER — Other Ambulatory Visit: Payer: Self-pay | Admitting: Orthopaedic Surgery

## 2021-10-19 MED ORDER — CELECOXIB 200 MG PO CAPS
200.0000 mg | ORAL_CAPSULE | Freq: Two times a day (BID) | ORAL | 3 refills | Status: AC
  Filled 2021-10-19: qty 30, 15d supply, fill #0

## 2021-10-20 ENCOUNTER — Other Ambulatory Visit (HOSPITAL_COMMUNITY): Payer: Self-pay

## 2021-10-20 MED ORDER — COVID-19 AT HOME ANTIGEN TEST VI KIT
PACK | 0 refills | Status: AC
  Filled 2021-10-20: qty 4, 8d supply, fill #0

## 2021-10-21 ENCOUNTER — Encounter: Payer: Self-pay | Admitting: Physical Therapy

## 2021-10-21 ENCOUNTER — Ambulatory Visit: Payer: No Typology Code available for payment source | Attending: Orthopaedic Surgery | Admitting: Physical Therapy

## 2021-10-21 DIAGNOSIS — M6281 Muscle weakness (generalized): Secondary | ICD-10-CM | POA: Insufficient documentation

## 2021-10-21 DIAGNOSIS — M25561 Pain in right knee: Secondary | ICD-10-CM | POA: Diagnosis present

## 2021-10-21 DIAGNOSIS — R600 Localized edema: Secondary | ICD-10-CM | POA: Diagnosis present

## 2021-10-21 DIAGNOSIS — R2689 Other abnormalities of gait and mobility: Secondary | ICD-10-CM | POA: Insufficient documentation

## 2021-10-21 NOTE — Therapy (Signed)
?OUTPATIENT PHYSICAL THERAPY TREATMENT NOTE ? ? ?Patient Name: Samantha Curtis ?MRN: [id number] ?DOB:Oct 21, 1965, 56 y.o., female ?Today's Date: 10/21/2021 ? ?PCP: Lavone Orn, MD ?REFERRING PROVIDER: Leandrew Koyanagi, MD ? ? PT End of Session - 10/21/21 1100   ? ? Visit Number 13   ? Date for PT Re-Evaluation 11/09/21   ? Authorization Type UMR- no visit limit   ? PT Start Time 1100   ? PT Stop Time 1145   ? PT Time Calculation (min) 45 min   ? ?  ?  ? ?  ? ? ? ? ? ? ? ? ? ? ? ?Past Medical History:  ?Diagnosis Date  ? Arthritis   ? "knees" (07/04/2017)  ? Complication of anesthesia   ? Dysrhythmia   ? PALPITATIONS  ? GERD (gastroesophageal reflux disease)   ? High cholesterol   ? Leg pain   ? PONV (postoperative nausea and vomiting)   ? ?Past Surgical History:  ?Procedure Laterality Date  ? ENDOVENOUS ABLATION SAPHENOUS VEIN W/ LASER Left 2005  ? [facility]  ? JOINT REPLACEMENT    ? KNEE ARTHROSCOPY Right   ? SHOULDER ARTHROSCOPY Right   ? "spurs"  ? SHOULDER ARTHROSCOPY Right 06/24/2019  ? Procedure: RIGHT SHOULDER ARTHROSCOPY; CHONDROPLASTY AND DEBRIEDMENT;  Surgeon: Melrose Nakayama, MD;  Location: [facility];  Service: Orthopedics;  Laterality: Right;  ? TOTAL KNEE ARTHROPLASTY Bilateral 07/03/2017  ? Procedure: TOTAL KNEE BILATERAL;  Surgeon: Melrose Nakayama, MD;  Location: [place];  Service: Orthopedics;  Laterality: Bilateral;  ? TOTAL KNEE REVISION Right 08/26/2021  ? Procedure: RIGHT TOTAL KNEE REVISION;  Surgeon: Leandrew Koyanagi, MD;  Location: [place];  Service: Orthopedics;  Laterality: Right;  ? TUBAL LIGATION    ? VAGINAL HYSTERECTOMY    ? ?Patient Active Problem List  ? Diagnosis Date Noted  ? Loosening of prosthesis of right total knee replacement ([place]) 08/26/2021  ? Status post revision of total replacement of right knee 08/26/2021  ? Status post total right knee replacement 08/04/2021  ? Allergic rhinitis 04/27/2021  ? Body mass index (BMI) of 25.0 to 29.9 04/27/2021  ? Cardiomegaly 04/27/2021  ? Cough variant  asthma 04/27/2021  ? Gastroduodenitis 04/27/2021  ? Osteoarthritis of knee 04/27/2021  ? Pure hypercholesterolemia 04/27/2021  ? Restless legs 04/27/2021  ? Ventricular premature depolarization 04/27/2021  ? S/P TKR (total knee replacement), bilateral   ? Gastroesophageal reflux disease   ? Post-operative pain   ? Elevated blood pressure reading   ? Bilateral primary osteoarthritis of knee 07/03/2017  ? Chest pain 08/31/2014  ? Palpitations 08/31/2014  ? Lateral epicondylitis of right elbow 04/02/2013  ? ? ?REFERRING DIAG: satus post total rt knee replacement (revision) ? ?THERAPY DIAG:  ?Muscle weakness (generalized) ? ?Other abnormalities of gait and mobility ? ?Localized edema ? ?Acute pain of right knee ? ?PERTINENT HISTORY: Bil TKA 2019, Rt shoulder scope 06/2019, Rt total knee revision 08/26/21 ? ?PRECAUTIONS: None ? ?SUBJECTIVE: MD put pt on Celebrex ? ?Are you having pain? Yes: NPRS scale: 5/10 ?Pain location: RT knee ?Pain description: Sore ?Aggravating factors: A lot of weightbearing ?Relieving factors: Ice. Meds maybe ? ?OBJECTIVE:  ?  ?PATIENT SURVEYS:  ?FOTO 37 (goal is 34) ? ?PALPATION: ?Edema about the Rt knee, healing surgical incision, diffuse palpable tenderness.  ?  ?LE ROM: ?  ?Passive ROM Right ?08/31/2021 Left ?08/31/2021 Right ?09/05/21 Right ?09/12/21 Right ?09/21/21 Right ?10/10/21  ?Hip flexion          ?  Hip extension          ?Hip abduction          ?Hip adduction          ?Hip internal rotation          ?Hip external rotation          ?Knee flexion 75 full 86 92 96 103  ?Knee extension -15 full 8 -8 -5 -4  ?Ankle dorsiflexion          ?Ankle plantarflexion          ?Ankle inversion          ?Ankle eversion          ? (Blank rows = not tested)  Painful with all testing  ?  ?LE MMT: ?  ?MMT Right ?08/31/2021 Left ?08/31/2021  ?Hip flexion 3-/5 (SLR)    ?Hip extension      ?Hip abduction      ?Hip adduction      ?Hip internal rotation      ?Hip external rotation      ?Knee flexion 3+/5 full  ?Knee  extension 3-/5 full  ?Ankle dorsiflexion 4+/5 full  ?Ankle plantarflexion   full  ?Ankle inversion   full  ?Ankle eversion   full  ? (Blank rows = not tested)  Too painful to test additional muscles. ?Poor quad set- gluteal compensation ?  ?GAIT: ?Distance walked: 100 ft ?Assistive device utilized: Single point cane ?Level of assistance: Modified independence ?Comments: Antalgic gait, reduced step length and time spent on Rt LE ?  ? ?TODAY'S TREATMENT: ?10/21/21: ?Nustep L1 x 8 min WITH PTA present to monitor ?Seated ball squeeze 5 sec hold 2x10 ?LAQ 0# 2x10 5 sec hold ?Toe Taps LT for RTLE SLS 2x10 ?6" step ups 5x with rail ?Game Ready x 10 min with medium compression 3 flakes  ? ?  ? ?10/10/21:  ?Nustep L1 x 5 min ?Hooklying ball squeeze 10x ?Tried SLR: 6 reps with pain ?LAQ 0# 10x ?Seated hamstring curls: green x10 on Rt ?Game Ready x 10 min with medium compression 3 flakes  ?Toe Taps LT for RTLE SLS 2x10 ?6" step ups 5x with 1 rail ?10/10/21:  ?Nustep L1 x 5 min ?Hooklying ball squeeze 10x ?Tried SLR: stopped too painful medially ?LAQ 0# 10x ?LT toe taps to step while SLS on RTLE 10x ?6" step ups 5x with 1 rail ?Game Ready x 10 min with medium compression 3 flakes  ?09/28/21: ?Nustep L6 x 8 min- PTA present to discuss progress/pain assessment ?Heel slides seated with slider x20 ?Seated hamstring stretch 3x20 seconds ?LAQ 0# x10 ?Ball squeeze: x10 ?Rockerboardx 1.5 min ?Heel raises x10 in standing  ?Game Ready x 15 min with medium compression 3 flakes  ? ? ? ?PATIENT EDUCATION:  ?Education details: Access Code: [email or web address] ?Person educated: Patient ?Education method: Explanation, Demonstration, and Handouts ?Education comprehension: verbalized understanding and returned demonstration ?  ?HOME EXERCISE PROGRAM: ?Access Code: [email or web address] ?URL: [email or web address] ?Date: 08/31/2021 ?Prepared by: Claiborne Billings ?  ?Exercises ?Supine Ankle Pumps - 3 x daily - 7 x weekly - 2 sets - 15 reps ?Supine Quadricep Sets - 3 x  daily - 7 x weekly - 2 sets - 10 reps - 5 hold ?Supine Active Straight Leg Raise - 2 x daily - 7 x weekly - 1 sets - 10 reps ?Supine Short Arc Quad - 2 x daily - 7 x weekly - 3 sets - 15 reps ?Supine Heel Slides - 3  x daily - 7 x weekly - 1 sets - 10 reps ?Seated Long Arc Quad - 1 x daily - 7 x weekly - 3 sets - 15 reps ?Seated Knee Flexion Stretch - 1 x daily - 7 x weekly - 3 sets - 15 reps ?  ?Patient Education ?Total Knee Replacement Handout ?  ?ASSESSMENT: ?  ?CLINICAL IMPRESSION: ?   ? ?OBJECTIVE IMPAIRMENTS Abnormal gait, decreased activity tolerance, decreased balance, decreased endurance, difficulty walking, decreased ROM, decreased strength, increased edema, impaired flexibility, and pain.  ?   ?  ?GOALS: ?Goals reviewed with patient? Yes ?  ?SHORT TERM GOALS: Target date: 10/05/2021 ?  ?Be independent in initial HEP ?Baseline:  ?Goal status: Met 09/05/21 ?  ?2.  Report < or = to 5/10 Rt knee pain with standing and walking to improve independence at home ?Baseline: 7/10 (09/07/21) ?Goal status: in progress ?  ?3.  Demonstrate Rt knee A/ROM flexion to > or = to 95 degrees to improve sitting and steps with less compensation ?Baseline: 96 (09/21/21) ?Goal status: MET ?  ?4.  Demonstrate Rt knee A/ROM extension to lacking < or  = to 8 degrees to normalize gait pattern ?Baseline: 5 (09/21/21) ?Goal status: Met 09/05/21  ?5.  Tolerate standing and walking > or = to 10-15 minutes for home tasks  ?Baseline: 30-45 min with pain, 15 minutes comfortably (09/21/21) ?Goal status: MET ?  ?  ?LONG TERM GOALS: Target date: 11/09/2021 ?  ?Be independent in advanced HEP ?Baseline:  ?Goal status: INITIAL ?  ?2.  Improve FOTO to > or = to 67 ?Baseline: 37 ?Goal status: INITIAL ?  ?3.  Demonstrate > or = to 110 degrees Rt knee flexion to improve ability to squat and negotiate steps  ?Baseline: 75 ?Goal status: INITIAL ?  ?4.  Demonstrate symmetry with ambulation on level surface with or without cane  ?Baseline:  minimal to moderate   antalgia without cane (09/19/21) ?Goal status: In progress ?  ?5.  Demonstrate 4+/5 Rt knee strength to improve endurance and independence with negotiating steps at home ?Baseline:  ?Goal status: INITIAL ?  ?6.  Redu

## 2021-10-24 ENCOUNTER — Other Ambulatory Visit (HOSPITAL_COMMUNITY): Payer: Self-pay

## 2021-10-25 ENCOUNTER — Other Ambulatory Visit (HOSPITAL_COMMUNITY): Payer: Self-pay

## 2021-10-25 MED ORDER — SUCRALFATE 1 G PO TABS
1.0000 g | ORAL_TABLET | Freq: Three times a day (TID) | ORAL | 0 refills | Status: AC
  Filled 2021-10-25: qty 21, 7d supply, fill #0

## 2021-10-25 MED ORDER — OMEPRAZOLE 40 MG PO CPDR
40.0000 mg | DELAYED_RELEASE_CAPSULE | Freq: Every day | ORAL | 1 refills | Status: AC
  Filled 2021-10-25: qty 30, 30d supply, fill #0

## 2021-10-25 MED ORDER — SUCRALFATE 1 GM/10ML PO SUSP
1.0000 g | Freq: Three times a day (TID) | ORAL | 0 refills | Status: DC
  Filled 2021-10-25: qty 270, 7d supply, fill #0

## 2021-10-26 ENCOUNTER — Ambulatory Visit: Payer: No Typology Code available for payment source

## 2021-10-26 ENCOUNTER — Other Ambulatory Visit (HOSPITAL_COMMUNITY): Payer: Self-pay

## 2021-10-26 DIAGNOSIS — M25561 Pain in right knee: Secondary | ICD-10-CM

## 2021-10-26 DIAGNOSIS — M6281 Muscle weakness (generalized): Secondary | ICD-10-CM

## 2021-10-26 DIAGNOSIS — R2689 Other abnormalities of gait and mobility: Secondary | ICD-10-CM

## 2021-10-26 DIAGNOSIS — R600 Localized edema: Secondary | ICD-10-CM

## 2021-10-26 NOTE — Therapy (Addendum)
OUTPATIENT PHYSICAL THERAPY TREATMENT NOTE   Patient Name: Samantha Curtis MRN: [id number] DOB:Nov 04, 1965, 56 y.o., female Today's Date: 10/26/2021  PCP: Lavone Orn, MD REFERRING PROVIDER: Eduard Roux, MD   PT End of Session - 10/26/21 1301     Visit Number 14    Date for PT Re-Evaluation 11/09/21    Authorization Type UMR- no visit limit    PT Start Time 1232    PT Stop Time 1315    PT Time Calculation (min) 43 min    Activity Tolerance Patient limited by pain    Behavior During Therapy [facility] for tasks assessed/performed                       Past Medical History:  Diagnosis Date   Arthritis    "knees" (12/14/3149)   Complication of anesthesia    Dysrhythmia    PALPITATIONS   GERD (gastroesophageal reflux disease)    High cholesterol    Leg pain    PONV (postoperative nausea and vomiting)    Past Surgical History:  Procedure Laterality Date   ENDOVENOUS ABLATION SAPHENOUS VEIN W/ LASER Left 2005   [facility]   JOINT REPLACEMENT     KNEE ARTHROSCOPY Right    SHOULDER ARTHROSCOPY Right    "spurs"   SHOULDER ARTHROSCOPY Right 06/24/2019   Procedure: RIGHT SHOULDER ARTHROSCOPY; CHONDROPLASTY AND DEBRIEDMENT;  Surgeon: Melrose Nakayama, MD;  Location: [facility];  Service: Orthopedics;  Laterality: Right;   TOTAL KNEE ARTHROPLASTY Bilateral 07/03/2017   Procedure: TOTAL KNEE BILATERAL;  Surgeon: Melrose Nakayama, MD;  Location: [place];  Service: Orthopedics;  Laterality: Bilateral;   TOTAL KNEE REVISION Right 08/26/2021   Procedure: RIGHT TOTAL KNEE REVISION;  Surgeon: Leandrew Koyanagi, MD;  Location: [place];  Service: Orthopedics;  Laterality: Right;   TUBAL LIGATION     VAGINAL HYSTERECTOMY     Patient Active Problem List   Diagnosis Date Noted   Loosening of prosthesis of right total knee replacement ([place]) 08/26/2021   Status post revision of total replacement of right knee 08/26/2021   Status post total right knee replacement 08/04/2021   Allergic rhinitis  04/27/2021   Body mass index (BMI) of 25.0 to 29.9 04/27/2021   Cardiomegaly 04/27/2021   Cough variant asthma 04/27/2021   Gastroduodenitis 04/27/2021   Osteoarthritis of knee 04/27/2021   Pure hypercholesterolemia 04/27/2021   Restless legs 04/27/2021   Ventricular premature depolarization 04/27/2021   S/P TKR (total knee replacement), bilateral    Gastroesophageal reflux disease    Post-operative pain    Elevated blood pressure reading    Bilateral primary osteoarthritis of knee 07/03/2017   Chest pain 08/31/2014   Palpitations 08/31/2014   Lateral epicondylitis of right elbow 04/02/2013    REFERRING DIAG: satus post total rt knee replacement (revision)  THERAPY DIAG:  Muscle weakness (generalized)  Other abnormalities of gait and mobility  Localized edema  Acute pain of right knee  PERTINENT HISTORY: Bil TKA 2019, Rt shoulder scope 06/2019, Rt total knee revision 08/26/21  PRECAUTIONS: None  SUBJECTIVE: I had to stop taking Celebrex because it irritated my stomach.  I would like to try doing my rehab at home because I have drained my HSA.   Are you having pain? Yes: NPRS scale: 5/10 Pain location: RT knee Pain description: Sore Aggravating factors: A lot of weightbearing Relieving factors: Ice. Meds maybe  OBJECTIVE:    PATIENT SURVEYS:  FOTO 37 (goal is 4)  PALPATION: Edema  about the Rt knee, healing surgical incision, diffuse palpable tenderness.    LE ROM:   Passive ROM Right 08/31/2021 Left 08/31/2021 Right 09/05/21 Right 09/12/21 Right 09/21/21 Right 10/10/21 Right 10/26/21  Hip flexion           Hip extension           Hip abduction           Hip adduction           Hip internal rotation           Hip external rotation           Knee flexion 75 full 86 92 96 103 103  Knee extension -15 full 8 -8 -5 -4 -4  Ankle dorsiflexion           Ankle plantarflexion           Ankle inversion           Ankle eversion            (Blank rows = not tested)   Painful with all testing    LE MMT:   MMT Right 08/31/2021 Right 10/26/21 Left 08/31/2021  Hip flexion 3-/5 (SLR)     Hip extension       Hip abduction       Hip adduction       Hip internal rotation       Hip external rotation       Knee flexion 3+/5 4/5 full  Knee extension 3-/5 4+/5 (pain) full  Ankle dorsiflexion 4+/5 5/5 full  Ankle plantarflexion    full  Ankle inversion    full  Ankle eversion    full   (Blank rows = not tested)  Too painful to test additional muscles. Poor quad set- gluteal compensation   GAIT: Distance walked: 100 ft Assistive device utilized: Single point cane Level of assistance: Modified independence Comments: Antalgic gait, reduced step length and time spent on Rt LE    TODAY'S TREATMENT: 10/26/21: Nustep L1 x 8 min WITH PT present to monitor Seated ball squeeze 5 sec hold 2x10 LAQ 0# 2x10 5 sec hold Toe Taps LT for RTLE SLS 2x10 6" step ups 5x with rail-moderate UE support Game Ready x 15 min with medium compression 3 flakes   10/21/21: Nustep L1 x 8 min WITH PTA present to monitor Seated ball squeeze 5 sec hold 2x10 LAQ 0# 2x10 5 sec hold Toe Taps LT for RTLE SLS 2x10 6" step ups 5x with rail Game Ready x 10 min with medium compression 3 flakes      10/10/21:  Nustep L1 x 5 min Hooklying ball squeeze 10x Tried SLR: 6 reps with pain LAQ 0# 10x Seated hamstring curls: green x10 on Rt Game Ready x 10 min with medium compression 3 flakes  Toe Taps LT for RTLE SLS 2x10 6" step ups 5x with 1 rail 10/10/21:  Nustep L1 x 5 min Hooklying ball squeeze 10x Tried SLR: stopped too painful medially LAQ 0# 10x LT toe taps to step while SLS on RTLE 10x 6" step ups 5x with 1 rail Game Ready x 10 min with medium compression 3 flakes    PATIENT EDUCATION:  Education details: Access Code: [email or web address] Person educated: Patient Education method: Explanation, Media planner, and Handouts Education comprehension: verbalized understanding and returned  demonstration   HOME EXERCISE PROGRAM: Access Code: [email or web address] URL: [email or web address] Date: 08/31/2021 Prepared by: Claiborne Billings   Exercises Supine Ankle Pumps - 3 x  daily - 7 x weekly - 2 sets - 15 reps Supine Quadricep Sets - 3 x daily - 7 x weekly - 2 sets - 10 reps - 5 hold Supine Active Straight Leg Raise - 2 x daily - 7 x weekly - 1 sets - 10 reps Supine Short Arc Quad - 2 x daily - 7 x weekly - 3 sets - 15 reps Supine Heel Slides - 3 x daily - 7 x weekly - 1 sets - 10 reps Seated Long Arc Quad - 1 x daily - 7 x weekly - 3 sets - 15 reps Seated Knee Flexion Stretch - 1 x daily - 7 x weekly - 3 sets - 15 reps   Patient Education Total Knee Replacement Handout   ASSESSMENT:   CLINICAL IMPRESSION: Pt would like to be placed on hold for now.  She is able to do all exercises at home and is compliant with doing exercises and riding her bike.  All function remains limited by pain and edema.  Pt is not able to negotiate steps with step over step and has to go down backwards and sideways due to pain.  Pt doesn't tolerate much exercise before pain limits continuation of session.  Pt would like to be placed on hold for now.       OBJECTIVE IMPAIRMENTS Abnormal gait, decreased activity tolerance, decreased balance, decreased endurance, difficulty walking, decreased ROM, decreased strength, increased edema, impaired flexibility, and pain.       GOALS: Goals reviewed with patient? Yes   SHORT TERM GOALS: Target date: 10/05/2021   Be independent in initial HEP Baseline:  Goal status: Met 09/05/21   2.  Report < or = to 5/10 Rt knee pain with standing and walking to improve independence at home Baseline: 7/10 (09/07/21) Goal status: in progress   3.  Demonstrate Rt knee A/ROM flexion to > or = to 95 degrees to improve sitting and steps with less compensation Baseline: 96 (09/21/21) Goal status: MET   4.  Demonstrate Rt knee A/ROM extension to lacking < or  = to 8 degrees to  normalize gait pattern Baseline: 5 (09/21/21) Goal status: Met 09/05/21  5.  Tolerate standing and walking > or = to 10-15 minutes for home tasks  Baseline: 30 minutes at a slow pace  Goal status: in progress     LONG TERM GOALS: Target date: 11/09/2021   Be independent in advanced HEP Baseline:  Goal status: Met for current HEP   2.  Improve FOTO to > or = to 67 Baseline: 37 Goal status: INITIAL   3.  Demonstrate > or = to 110 degrees Rt knee flexion to improve ability to squat and negotiate steps  Baseline: 75 Goal status: INITIAL   4.  Demonstrate symmetry with ambulation on level surface with or without cane  Baseline:  minimal to moderate  antalgia without cane (10/26/21)- due to pain Goal status: In progress   5.  Demonstrate 4+/5 Rt knee strength to improve endurance and independence with negotiating steps at home Baseline:  Goal status: INITIAL   6.  Reduce Rt knee pain to allow to stand and walk > or = to 30 min to 1 hour to facilitate return to work  Baseline: 30 min (10/26/21) Goal status: INITIAL     PLAN: PT FREQUENCY: 2x/week   PT DURATION: 10 weeks   PLANNED INTERVENTIONS: Therapeutic exercises, Therapeutic activity, Neuromuscular re-education, Balance training, Gait training, Patient/Family education, Joint mobilization, Stair training, Dry Needling,  Cryotherapy, Moist heat, Taping, Vasopneumatic device, and Manual therapy   PLAN FOR NEXT SESSION: Check on pain levels, continue with tissue mobility, AROM, quad activation, pain management if needed. Hold chart x 30 days.   Sigurd Sos, PT 10/26/21 1:04 PM  PHYSICAL THERAPY DISCHARGE SUMMARY  Visits from Start of Care: 14  Current functional level related to goals / functional outcomes: See above for most current PT status.     Remaining deficits: See above.  Rt knee pain and limited function at time of last session.   Education / Equipment: HEP   Patient agrees to discharge. Patient goals were  partially met. Patient is being discharged due to the patient's request. Sigurd Sos, PT 12/30/21 10:34 AM   [facility] [address], [place], [place] [address] Phone # [phone] Fax [phone]

## 2021-10-27 ENCOUNTER — Other Ambulatory Visit (HOSPITAL_COMMUNITY): Payer: Self-pay

## 2021-10-30 ENCOUNTER — Other Ambulatory Visit: Payer: Self-pay | Admitting: Physician Assistant

## 2021-10-31 ENCOUNTER — Other Ambulatory Visit: Payer: Self-pay | Admitting: Physician Assistant

## 2021-10-31 ENCOUNTER — Other Ambulatory Visit (HOSPITAL_COMMUNITY): Payer: Self-pay

## 2021-10-31 ENCOUNTER — Encounter: Payer: Self-pay | Admitting: Physical Therapy

## 2021-11-02 ENCOUNTER — Other Ambulatory Visit (HOSPITAL_COMMUNITY): Payer: Self-pay

## 2021-11-03 ENCOUNTER — Other Ambulatory Visit (HOSPITAL_COMMUNITY): Payer: Self-pay

## 2021-11-03 ENCOUNTER — Other Ambulatory Visit: Payer: Self-pay | Admitting: Physician Assistant

## 2021-11-03 IMAGING — NM NM BONE 3 PHASE
6 series · 16 of 16 positions shown · non-contrast
Comparison: None available

CLINICAL DATA: Knee pain.  Bilateral knee replacements 07/03/2017

EXAM:
NUCLEAR MEDICINE 3-PHASE BONE SCAN
TECHNIQUE: Radionuclide angiographic images, immediate static blood pool
images, and 3-hour delayed static images were obtained of the knees
after intravenous injection of radiopharmaceutical.
RADIOPHARMACEUTICALS:  20.4 mCi Rc-EEm MDP IV

[Series 1: flow · 2.07mm/px · 6 of 48 frames shown (1 of 2)]
[frame 5/48]
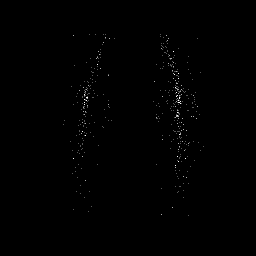
[frame 13/48  full-range]
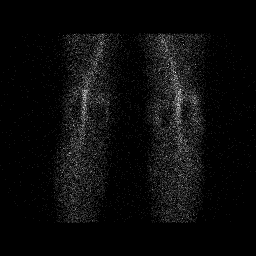
[frame 21/48  full-range]
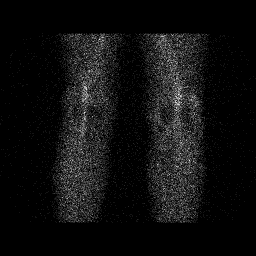
[frame 29/48  full-range]
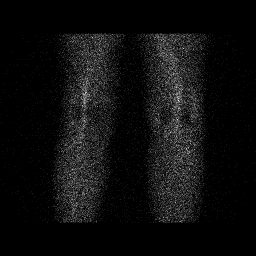
[frame 37/48  full-range]
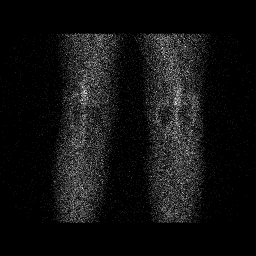
[frame 45/48  full-range]
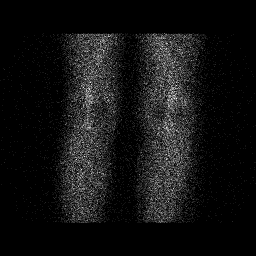

[Series 1: flow · 2.07mm/px · 6 of 48 frames shown (2 of 2)]
[frame 5/48]
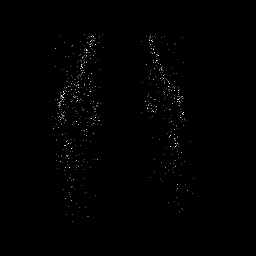
[frame 13/48  full-range]
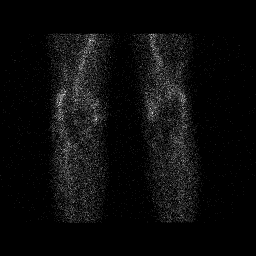
[frame 21/48  full-range]
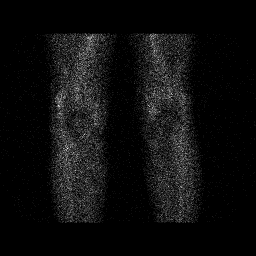
[frame 29/48  full-range]
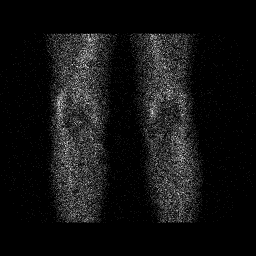
[frame 37/48  full-range]
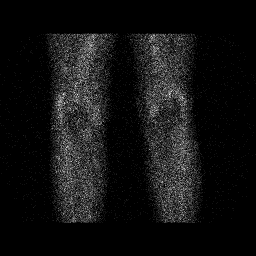
[frame 45/48  full-range]
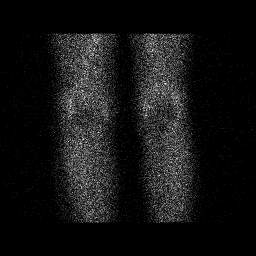

[Series 2: blood pool · 2.07mm/px · 1 of 1 slices shown (1 of 2)]
[im 1/1]
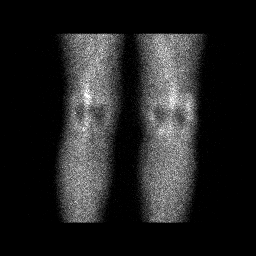

[Series 2: blood pool · 2.07mm/px · 1 of 1 slices shown (2 of 2)]
[im 1/1]
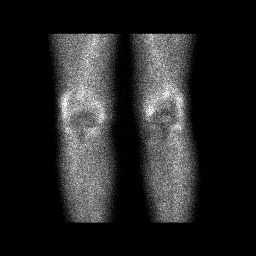

[Series 3: lat bp · 2.07mm/px · 1 of 1 slices shown (1 of 2)]
[im 1/1]
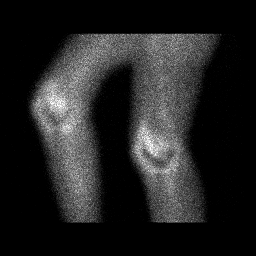

[Series 3: lat bp · 2.07mm/px · 1 of 1 slices shown (2 of 2)]
[im 1/1]
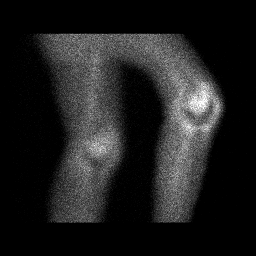

[16 of 16 positions shown; findings below may reference images not displayed]

FINDINGS: Vascular phase: Mild increased blood flow two than above the knee
joint most prominent in RIGHT lateral condyle region.

Blood pool phase: Mild Liljan increased blood pool activity above the
knee joints which matches the hyperemia and appears to localize to
the joint capsule.

Delayed phase: Delayed phase uptake in the tibial plateaus and
femoral condyles typical of post arthroplasty uptake. There is some
focality in the RIGHT femoral condyle which matches the above
phases.
IMPRESSION: 1. Mild hyperemia and mild blood pool activity in the joint capsules
above the knee joint knee joint and most focal in the RIGHT lateral
condyle region. Findings are suggestive of bursitis/capsulitis.
2. Subtle focal delayed uptake in the RIGHT femoral condyle matches
the hyperemia blood pool activity. This could represent a site of
early loosening.
3. More generalized uptake within the LEFT and RIGHT knee typical of
post arthroplasty.

## 2021-11-04 ENCOUNTER — Other Ambulatory Visit (HOSPITAL_COMMUNITY): Payer: Self-pay

## 2021-11-07 ENCOUNTER — Other Ambulatory Visit (HOSPITAL_COMMUNITY): Payer: Self-pay

## 2021-11-07 ENCOUNTER — Other Ambulatory Visit: Payer: Self-pay | Admitting: Physician Assistant

## 2021-11-08 ENCOUNTER — Other Ambulatory Visit (HOSPITAL_COMMUNITY): Payer: Self-pay

## 2021-11-09 ENCOUNTER — Other Ambulatory Visit (HOSPITAL_BASED_OUTPATIENT_CLINIC_OR_DEPARTMENT_OTHER): Payer: Self-pay

## 2021-11-11 ENCOUNTER — Other Ambulatory Visit (HOSPITAL_COMMUNITY): Payer: Self-pay

## 2021-11-15 ENCOUNTER — Encounter: Payer: Self-pay | Admitting: Orthopaedic Surgery

## 2021-11-15 ENCOUNTER — Other Ambulatory Visit (HOSPITAL_COMMUNITY): Payer: Self-pay

## 2021-11-15 ENCOUNTER — Ambulatory Visit (INDEPENDENT_AMBULATORY_CARE_PROVIDER_SITE_OTHER): Payer: No Typology Code available for payment source | Admitting: Orthopaedic Surgery

## 2021-11-15 DIAGNOSIS — Z96651 Presence of right artificial knee joint: Secondary | ICD-10-CM

## 2021-11-15 MED ORDER — METHOCARBAMOL 500 MG PO TABS
500.0000 mg | ORAL_TABLET | Freq: Four times a day (QID) | ORAL | 2 refills | Status: AC | PRN
  Filled 2021-11-15: qty 30, 8d supply, fill #0
  Filled 2022-11-08 (×2): qty 30, 8d supply, fill #1

## 2021-11-15 MED ORDER — TRAMADOL HCL 50 MG PO TABS
50.0000 mg | ORAL_TABLET | Freq: Every evening | ORAL | 0 refills | Status: DC | PRN
  Filled 2021-11-15: qty 30, 15d supply, fill #0

## 2021-11-15 NOTE — Progress Notes (Signed)
Post-Op Visit Note   Patient: Samantha Curtis           Date of Birth: 1966/01/15           MRN: [id number] Visit Date: 11/15/2021 PCP: Kirby Funk, MD   Assessment & Plan:  Chief Complaint:  Chief Complaint  Patient presents with   Right Knee - Pain   Visit Diagnoses:  1. Status post revision of total replacement of right knee     Plan: Patient is 12 weeks status post revision for right femoral component.  She is doing a little bit better.  Mainly she is limited by swelling that is related to activity and standing.  She has occasional burning to the lateral side of the knee.  The swelling and the pain wakes her up at night.  She has had to temporarily stop physical therapy due to the swelling.  Continues to use ice and elevates the leg around-the-clock.  Examination of the right knee shows a fully healed surgical scar.  Range of motion is 3 to 103 degrees.  Stable to varus valgus.  Moderate diffuse swelling throughout the knee.  No evidence of infection.  From my standpoint Samantha Curtis is recovering appropriately but she is not at a point that she can return back to work full duty and based on our discussion light duty is not available.  We will keep her out of work for another 6 weeks so that the knee can continue to recover.  Right now she has too much swelling to allow her to return back to work.  I refilled the Robaxin and tramadol today.  Recheck in 6 weeks to reassess if she will be able to go back to part-time work.  She will need to be x-rays of the right knee at that time.  Follow-Up Instructions: Return in about 6 weeks (around 12/27/2021).   Orders:  No orders of the defined types were placed in this encounter.  Meds ordered this encounter  Medications   methocarbamol (ROBAXIN) 500 MG tablet    Sig: Take 1 tablet (500 mg total) by mouth every 6 (six) hours as needed for muscle spasms.    Dispense:  30 tablet    Refill:  2   traMADol (ULTRAM) 50 MG tablet    Sig: Take 1-2  tablets (50-100 mg total) by mouth at bedtime as needed.    Dispense:  30 tablet    Refill:  0    Imaging: No results found.  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) 08/26/2021   Status post revision of total replacement of right knee 08/26/2021   Status post total right knee replacement 08/04/2021   Allergic rhinitis 04/27/2021   Body mass index (BMI) of 25.0 to 29.9 04/27/2021   Cardiomegaly 04/27/2021   Cough variant asthma 04/27/2021   Gastroduodenitis 04/27/2021   Osteoarthritis of knee 04/27/2021   Pure hypercholesterolemia 04/27/2021   Restless legs 04/27/2021   Ventricular premature depolarization 04/27/2021   S/P TKR (total knee replacement), bilateral    Gastroesophageal reflux disease    Post-operative pain    Elevated blood pressure reading    Bilateral primary osteoarthritis of knee 07/03/2017   Chest pain 08/31/2014   Palpitations 08/31/2014   Lateral epicondylitis of right elbow 04/02/2013   Past Medical History:  Diagnosis Date   Arthritis    "knees" (07/04/2017)   Complication of anesthesia  Dysrhythmia    PALPITATIONS   GERD (gastroesophageal reflux disease)    High cholesterol    Leg pain    PONV (postoperative nausea and vomiting)     Family History  Problem Relation Age of Onset   Dementia Mother     Past Surgical History:  Procedure Laterality Date   ENDOVENOUS ABLATION SAPHENOUS VEIN W/ LASER Left 2005   [facility]   JOINT REPLACEMENT     KNEE ARTHROSCOPY Right    SHOULDER ARTHROSCOPY Right    "spurs"   SHOULDER ARTHROSCOPY Right 06/24/2019   Procedure: RIGHT SHOULDER ARTHROSCOPY; CHONDROPLASTY AND DEBRIEDMENT;  Surgeon: Marcene Corning, MD;  Location: [facility];  Service: Orthopedics;  Laterality: Right;   TOTAL KNEE ARTHROPLASTY Bilateral 07/03/2017   Procedure: TOTAL KNEE BILATERAL;  Surgeon: Marcene Corning, MD;  Location: [facility];  Service:  Orthopedics;  Laterality: Bilateral;   TOTAL KNEE REVISION Right 08/26/2021   Procedure: RIGHT TOTAL KNEE REVISION;  Surgeon: Tarry Kos, MD;  Location: [facility];  Service: Orthopedics;  Laterality: Right;   TUBAL LIGATION     VAGINAL HYSTERECTOMY     Social History   Occupational History   Not on file  Tobacco Use   Smoking status: Never   Smokeless tobacco: Never  Vaping Use   Vaping Use: Never used  Substance and Sexual Activity   Alcohol use: No   Drug use: No   Sexual activity: Yes

## 2021-11-18 ENCOUNTER — Encounter: Payer: No Typology Code available for payment source | Admitting: Orthopaedic Surgery

## 2021-11-24 ENCOUNTER — Other Ambulatory Visit (HOSPITAL_COMMUNITY): Payer: Self-pay

## 2021-11-25 ENCOUNTER — Other Ambulatory Visit (HOSPITAL_COMMUNITY): Payer: Self-pay

## 2021-12-05 ENCOUNTER — Other Ambulatory Visit (HOSPITAL_COMMUNITY): Payer: Self-pay

## 2021-12-06 ENCOUNTER — Encounter: Payer: Self-pay | Admitting: Orthopaedic Surgery

## 2021-12-21 ENCOUNTER — Other Ambulatory Visit (HOSPITAL_COMMUNITY): Payer: Self-pay

## 2021-12-23 ENCOUNTER — Ambulatory Visit: Payer: No Typology Code available for payment source | Admitting: Orthopaedic Surgery

## 2021-12-23 ENCOUNTER — Ambulatory Visit (INDEPENDENT_AMBULATORY_CARE_PROVIDER_SITE_OTHER): Payer: No Typology Code available for payment source

## 2021-12-23 ENCOUNTER — Encounter: Payer: Self-pay | Admitting: Orthopaedic Surgery

## 2021-12-23 DIAGNOSIS — Z96651 Presence of right artificial knee joint: Secondary | ICD-10-CM | POA: Diagnosis not present

## 2021-12-23 NOTE — Progress Notes (Signed)
Office Visit Note   Patient: Samantha Curtis           Date of Birth: 10-04-1965           MRN: [id number] Visit Date: 12/23/2021              Requested by: Kirby Funk, MD [address] [place],  [place] [address] PCP: Kirby Funk, MD   Assessment & Plan: Visit Diagnoses:  1. Status post revision of total replacement of right knee     Plan: Margurete is now 4 months from the revision surgery.  She has been making slow progress.  Her short-term disability runs out on the 12th of this month therefore she would like to try to go back to full-time work on the 13th.  If she has any problems going back to work or being able to tolerate a full day she will follow-up with me to talk about restrictions or continuing her disability.  I would like to see her back for her scheduled appointment in 2 months to recheck her knee.  She will need two-view x-rays of the right knee at that time.  Follow-Up Instructions: Return in about 2 months (around 02/23/2022).   Orders:  Orders Placed This Encounter  Procedures   XR Knee 1-2 Views Right   No orders of the defined types were placed in this encounter.     Procedures: No procedures performed   Clinical Data: No additional findings.   Subjective: Chief Complaint  Patient presents with   Right Knee - Routine Post Op    HPI Patient is 4 months status post revision of right knee femoral component on 08/26/2021.  She is feeling improvement overall.  Swelling has improved.  She feels like she is about 65 to 70% from baseline now.  Does experience swelling with increased activity and towards the end of the day.  Review of Systems   Objective: Vital Signs: There were no vitals taken for this visit.  Physical Exam  Ortho Exam Examination of right knee shows a fully healed surgical scar.  Small effusion.  Range of motion is approximately 0 to 95 degrees.  Stable to varus valgus.  Specialty Comments:  No specialty comments  available.  Imaging: XR Knee 1-2 Views Right  Result Date: 12/23/2021 Stable implants.  No complications.    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) 08/26/2021   Status post revision of total replacement of right knee 08/26/2021   Status post total right knee replacement 08/04/2021   Allergic rhinitis 04/27/2021   Body mass index (BMI) of 25.0 to 29.9 04/27/2021   Cardiomegaly 04/27/2021   Cough variant asthma 04/27/2021   Gastroduodenitis 04/27/2021   Osteoarthritis of knee 04/27/2021   Pure hypercholesterolemia 04/27/2021   Restless legs 04/27/2021   Ventricular premature depolarization 04/27/2021   S/P TKR (total knee replacement), bilateral    Gastroesophageal reflux disease    Post-operative pain    Elevated blood pressure reading    Bilateral primary osteoarthritis of knee 07/03/2017   Chest pain 08/31/2014   Palpitations 08/31/2014   Lateral epicondylitis of right elbow 04/02/2013   Past Medical History:  Diagnosis Date   Arthritis    "knees" (07/04/2017)   Complication of anesthesia    Dysrhythmia    PALPITATIONS   GERD (gastroesophageal reflux disease)    High cholesterol    Leg pain  PONV (postoperative nausea and vomiting)     Family History  Problem Relation Age of Onset   Dementia Mother     Past Surgical History:  Procedure Laterality Date   ENDOVENOUS ABLATION SAPHENOUS VEIN W/ LASER Left 2005   [facility]   JOINT REPLACEMENT     KNEE ARTHROSCOPY Right    SHOULDER ARTHROSCOPY Right    "spurs"   SHOULDER ARTHROSCOPY Right 06/24/2019   Procedure: RIGHT SHOULDER ARTHROSCOPY; CHONDROPLASTY AND DEBRIEDMENT;  Surgeon: Marcene Corning, MD;  Location: [facility];  Service: Orthopedics;  Laterality: Right;   TOTAL KNEE ARTHROPLASTY Bilateral 07/03/2017   Procedure: TOTAL KNEE BILATERAL;  Surgeon: Marcene Corning, MD;  [facility];  Service: Orthopedics;   Laterality: Bilateral;   TOTAL KNEE REVISION Right 08/26/2021   Procedure: RIGHT TOTAL KNEE REVISION;  Surgeon: Tarry Kos, MD;  [facility];  Service: Orthopedics;  Laterality: Right;   TUBAL LIGATION     VAGINAL HYSTERECTOMY     Social History   Occupational History   Not on file  Tobacco Use   Smoking status: Never   Smokeless tobacco: Never  Vaping Use   Vaping Use: Never used  Substance and Sexual Activity   Alcohol use: No   Drug use: No   Sexual activity: Yes

## 2022-01-09 ENCOUNTER — Other Ambulatory Visit (HOSPITAL_COMMUNITY): Payer: Self-pay

## 2022-01-09 ENCOUNTER — Other Ambulatory Visit: Payer: Self-pay | Admitting: Orthopaedic Surgery

## 2022-01-11 ENCOUNTER — Other Ambulatory Visit (HOSPITAL_COMMUNITY): Payer: Self-pay

## 2022-01-11 ENCOUNTER — Other Ambulatory Visit: Payer: Self-pay | Admitting: Orthopaedic Surgery

## 2022-01-12 ENCOUNTER — Other Ambulatory Visit (HOSPITAL_COMMUNITY): Payer: Self-pay

## 2022-01-16 ENCOUNTER — Other Ambulatory Visit (HOSPITAL_COMMUNITY): Payer: Self-pay

## 2022-01-16 ENCOUNTER — Other Ambulatory Visit: Payer: Self-pay | Admitting: Orthopaedic Surgery

## 2022-01-18 ENCOUNTER — Other Ambulatory Visit (HOSPITAL_COMMUNITY): Payer: Self-pay

## 2022-01-18 ENCOUNTER — Other Ambulatory Visit: Payer: Self-pay | Admitting: Orthopaedic Surgery

## 2022-01-19 ENCOUNTER — Other Ambulatory Visit: Payer: Self-pay | Admitting: Orthopaedic Surgery

## 2022-01-19 ENCOUNTER — Other Ambulatory Visit (HOSPITAL_COMMUNITY): Payer: Self-pay

## 2022-01-24 ENCOUNTER — Other Ambulatory Visit (HOSPITAL_COMMUNITY): Payer: Self-pay

## 2022-01-24 ENCOUNTER — Other Ambulatory Visit: Payer: Self-pay | Admitting: Orthopaedic Surgery

## 2022-01-25 ENCOUNTER — Other Ambulatory Visit (HOSPITAL_COMMUNITY): Payer: Self-pay

## 2022-02-06 ENCOUNTER — Other Ambulatory Visit (HOSPITAL_COMMUNITY): Payer: Self-pay

## 2022-02-06 MED ORDER — METOPROLOL TARTRATE 25 MG PO TABS
25.0000 mg | ORAL_TABLET | Freq: Every day | ORAL | 3 refills | Status: AC
  Filled 2022-02-06: qty 90, 90d supply, fill #0
  Filled 2022-05-03: qty 90, 90d supply, fill #1
  Filled 2022-08-12: qty 90, 90d supply, fill #2
  Filled 2022-11-08 (×2): qty 90, 90d supply, fill #3

## 2022-02-21 ENCOUNTER — Ambulatory Visit: Payer: No Typology Code available for payment source | Admitting: Orthopaedic Surgery

## 2022-02-21 ENCOUNTER — Other Ambulatory Visit (HOSPITAL_COMMUNITY): Payer: Self-pay

## 2022-02-22 ENCOUNTER — Other Ambulatory Visit (HOSPITAL_COMMUNITY): Payer: Self-pay

## 2022-02-22 MED ORDER — ATORVASTATIN CALCIUM 10 MG PO TABS
10.0000 mg | ORAL_TABLET | Freq: Every day | ORAL | 3 refills | Status: DC
  Filled 2022-02-22: qty 90, 90d supply, fill #0
  Filled 2022-05-28: qty 90, 90d supply, fill #1
  Filled 2022-08-12: qty 90, 90d supply, fill #2
  Filled 2022-11-08 (×2): qty 90, 90d supply, fill #3

## 2022-02-28 ENCOUNTER — Ambulatory Visit: Payer: No Typology Code available for payment source | Admitting: Orthopaedic Surgery

## 2022-02-28 ENCOUNTER — Ambulatory Visit: Payer: Self-pay

## 2022-02-28 ENCOUNTER — Other Ambulatory Visit (HOSPITAL_COMMUNITY): Payer: Self-pay

## 2022-02-28 ENCOUNTER — Encounter: Payer: Self-pay | Admitting: Orthopaedic Surgery

## 2022-02-28 DIAGNOSIS — Z96651 Presence of right artificial knee joint: Secondary | ICD-10-CM | POA: Diagnosis not present

## 2022-02-28 MED ORDER — AMOXICILLIN 500 MG PO CAPS
2000.0000 mg | ORAL_CAPSULE | ORAL | 6 refills | Status: AC
  Filled 2022-02-28: qty 4, 1d supply, fill #0
  Filled 2022-06-28: qty 4, 1d supply, fill #1
  Filled 2022-08-14: qty 4, 1d supply, fill #2
  Filled 2022-11-08 (×2): qty 4, 1d supply, fill #3

## 2022-02-28 MED ORDER — TRAMADOL HCL 50 MG PO TABS
50.0000 mg | ORAL_TABLET | Freq: Every evening | ORAL | 0 refills | Status: AC | PRN
  Filled 2022-02-28 (×2): qty 30, 15d supply, fill #0

## 2022-02-28 NOTE — Progress Notes (Signed)
Post-Op Visit Note   Patient: Samantha Curtis           Date of Birth: 01-29-1966           MRN: [id number] Visit Date: 02/28/2022 PCP: Kirby Funk, MD   Assessment & Plan:  Chief Complaint:  Chief Complaint  Patient presents with   Right Knee - Follow-up    Right total knee revision 08/26/2021   Visit Diagnoses:  1. Status post revision of total replacement of right knee     Plan: Patient is 62-month status post right total knee revision of femoral component on 08/26/2021.  She has been back to work full-time.  Has effusion with increased activity.  Her main complaint is just the swelling and the limitations due to the swelling.  Denies any constitutional symptoms.  Examination of right knee shows mild joint effusion.  No signs of infection.  Range of motion is at baseline.  Fully healed surgical scar.  Patient declined x-rays today.  Casondra is recovering well from the surgery.  Her main issue seems to be the effusion which is aggravated by activity.  She is in poor spirits due to her continued activity limitations due to this.  She will continue to treat symptomatically.  I offered to aspirate the effusion and sent to the lab for analysis but she decided to hold off for now.  Recheck in 6 months with two-view x-rays of the right knee.  Follow-Up Instructions: Return in about 6 months (around 08/29/2022).   Orders:  No orders of the defined types were placed in this encounter.  Meds ordered this encounter  Medications   traMADol (ULTRAM) 50 MG tablet    Sig: Take 1-2 tablets (50-100 mg total) by mouth at bedtime as needed.    Dispense:  30 tablet    Refill:  0   amoxicillin (AMOXIL) 500 MG capsule    Sig: Take 4 capsules (2,000 mg total) by mouth once for 1 dose.    Dispense:  4 capsule    Refill:  6    Imaging: No results found.  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) 08/26/2021    Status post revision of total replacement of right knee 08/26/2021   Status post total right knee replacement 08/04/2021   Allergic rhinitis 04/27/2021   Body mass index (BMI) of 25.0 to 29.9 04/27/2021   Cardiomegaly 04/27/2021   Cough variant asthma 04/27/2021   Gastroduodenitis 04/27/2021   Osteoarthritis of knee 04/27/2021   Pure hypercholesterolemia 04/27/2021   Restless legs 04/27/2021   Ventricular premature depolarization 04/27/2021   S/P TKR (total knee replacement), bilateral    Gastroesophageal reflux disease    Post-operative pain    Elevated blood pressure reading    Bilateral primary osteoarthritis of knee 07/03/2017   Chest pain 08/31/2014   Palpitations 08/31/2014   Lateral epicondylitis of right elbow 04/02/2013   Past Medical History:  Diagnosis Date   Arthritis    "knees" (07/04/2017)   Complication of anesthesia    Dysrhythmia    PALPITATIONS   GERD (gastroesophageal reflux disease)    High cholesterol    Leg pain    PONV (postoperative nausea and vomiting)     Family History  Problem Relation Age of Onset   Dementia Mother     Past Surgical History:  Procedure Laterality Date   ENDOVENOUS ABLATION SAPHENOUS VEIN W/ LASER Left 2005  [facility]   JOINT REPLACEMENT     KNEE ARTHROSCOPY Right    SHOULDER ARTHROSCOPY Right    "spurs"   SHOULDER ARTHROSCOPY Right 06/24/2019   Procedure: RIGHT SHOULDER ARTHROSCOPY; CHONDROPLASTY AND DEBRIEDMENT;  Surgeon: Marcene Corning, MD;  Location: [facility];  Service: Orthopedics;  Laterality: Right;   TOTAL KNEE ARTHROPLASTY Bilateral 07/03/2017   Procedure: TOTAL KNEE BILATERAL;  Surgeon: Marcene Corning, MD;  [facility];  Service: Orthopedics;  Laterality: Bilateral;   TOTAL KNEE REVISION Right 08/26/2021   Procedure: RIGHT TOTAL KNEE REVISION;  Surgeon: Tarry Kos, MD;  Location[facility];  Service: Orthopedics;  Laterality: Right;   TUBAL LIGATION     VAGINAL HYSTERECTOMY     Social History    Occupational History   Not on file  Tobacco Use   Smoking status: Never   Smokeless tobacco: Never  Vaping Use   Vaping Use: Never used  Substance and Sexual Activity   Alcohol use: No   Drug use: No   Sexual activity: Yes

## 2022-04-06 ENCOUNTER — Other Ambulatory Visit (HOSPITAL_COMMUNITY): Payer: Self-pay

## 2022-04-07 ENCOUNTER — Other Ambulatory Visit: Payer: Self-pay | Admitting: Obstetrics and Gynecology

## 2022-04-07 DIAGNOSIS — Z1231 Encounter for screening mammogram for malignant neoplasm of breast: Secondary | ICD-10-CM

## 2022-04-11 ENCOUNTER — Ambulatory Visit (INDEPENDENT_AMBULATORY_CARE_PROVIDER_SITE_OTHER): Payer: No Typology Code available for payment source | Admitting: Orthopaedic Surgery

## 2022-04-11 ENCOUNTER — Ambulatory Visit (INDEPENDENT_AMBULATORY_CARE_PROVIDER_SITE_OTHER): Payer: No Typology Code available for payment source

## 2022-04-11 DIAGNOSIS — Z96651 Presence of right artificial knee joint: Secondary | ICD-10-CM

## 2022-04-11 MED ORDER — BUPIVACAINE HCL 0.5 % IJ SOLN
2.0000 mL | INTRAMUSCULAR | Status: AC | PRN
  Administered 2022-04-11: 2 mL via INTRA_ARTICULAR

## 2022-04-11 MED ORDER — LIDOCAINE HCL 1 % IJ SOLN
2.0000 mL | INTRAMUSCULAR | Status: AC | PRN
  Administered 2022-04-11: 2 mL

## 2022-04-11 NOTE — Progress Notes (Signed)
Office Visit Note   Patient: Samantha Curtis           Date of Birth: 09-05-1965           MRN: [id number] Visit Date: 04/11/2022              Requested by: Lavone Orn, MD [address] [place],  [place] [address] PCP: Lavone Orn, MD   Assessment & Plan: Visit Diagnoses:  1. Status post revision of total replacement of right knee     Plan: Impression is right knee effusion 6 to 7 months status post revision femoral component.  Under sterile conditions I aspirated 40 cc of blood-tinged fluid.  There is no gross purulence.  We will send this to the lab.  Compression wrap applied.  She will try to return back to work.  Follow-up as scheduled.  I will notify her with the fluid results.  Follow-Up Instructions: No follow-ups on file.   Orders:  Orders Placed This Encounter  Procedures   Large Joint Inj: R knee   Anaerobic and Aerobic Culture   XR KNEE 3 VIEW RIGHT   Synovial Fluid Analysis, Complete   No orders of the defined types were placed in this encounter.     Procedures: Large Joint Inj: R knee on 04/11/2022 2:11 PM Indications: pain Details: 22 G needle  Arthrogram: No  Medications: 2 mL lidocaine 1 %; 2 mL bupivacaine 0.5 % Aspirate: 40 mL blood-tinged; sent for lab analysis Outcome: tolerated well, no immediate complications Consent was given by the patient. Patient was prepped and draped in the usual sterile fashion.       Clinical Data: No additional findings.   Subjective: Chief Complaint  Patient presents with   Right Knee - Pain    HPI Patient returns today approximately 6 to 7 months status post a revision of the right femoral component.  She has had recent worsening in the last 3 weeks.  She has had increased pain and swelling and is causing her to limp.  She is having a lot of trouble working.  She also has significant difficulty with ADLs as well.  She is having trouble sleeping at night.  Denies any constitutional  symptoms.  Review of Systems   Objective: Vital Signs: There were no vitals taken for this visit.  Physical Exam  Ortho Exam Examination of the right knee shows moderate joint effusion.  There is no warmth.  There is guarding to range of motion secondary to pain and effusion.  Good varus valgus stability. Specialty Comments:  No specialty comments available.  Imaging: XR KNEE 3 VIEW RIGHT  Result Date: 04/11/2022 AP lateral and sunrise views show stable revision femoral component and unchanged tibial baseplate.  Joint effusion present in the suprapatellar space.    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 ([place]) 08/26/2021   Status post revision of total replacement of right knee 08/26/2021   Status post total right knee replacement 08/04/2021   Allergic rhinitis 04/27/2021   Body mass index (BMI) of 25.0 to 29.9 04/27/2021   Cardiomegaly 04/27/2021   Cough variant asthma 04/27/2021   Gastroduodenitis 04/27/2021   Osteoarthritis of knee 04/27/2021   Pure hypercholesterolemia 04/27/2021   Restless legs 04/27/2021   Ventricular premature depolarization 04/27/2021   S/P TKR (total knee replacement), bilateral    Gastroesophageal reflux disease    Post-operative pain  Elevated blood pressure reading    Bilateral primary osteoarthritis of knee 07/03/2017   Chest pain 08/31/2014   Palpitations 08/31/2014   Lateral epicondylitis of right elbow 04/02/2013   Past Medical History:  Diagnosis Date   Arthritis    "knees" (1/32/4401)   Complication of anesthesia    Dysrhythmia    PALPITATIONS   GERD (gastroesophageal reflux disease)    High cholesterol    Leg pain    PONV (postoperative nausea and vomiting)     Family History  Problem Relation Age of Onset   Dementia Mother     Past Surgical History:  Procedure Laterality Date   ENDOVENOUS ABLATION SAPHENOUS VEIN W/ LASER Left 2005    [facility]   JOINT REPLACEMENT     KNEE ARTHROSCOPY Right    SHOULDER ARTHROSCOPY Right    "spurs"   SHOULDER ARTHROSCOPY Right 06/24/2019   Procedure: RIGHT SHOULDER ARTHROSCOPY; CHONDROPLASTY AND DEBRIEDMENT;  Surgeon: Melrose Nakayama, MD;  Location: [facility];  Service: Orthopedics;  Laterality: Right;   TOTAL KNEE ARTHROPLASTY Bilateral 07/03/2017   Procedure: TOTAL KNEE BILATERAL;  Surgeon: Melrose Nakayama, MD;  Location: [facility];  Service: Orthopedics;  Laterality: Bilateral;   TOTAL KNEE REVISION Right 08/26/2021   Procedure: RIGHT TOTAL KNEE REVISION;  Surgeon: Leandrew Koyanagi, MD;  Location: [facility];  Service: Orthopedics;  Laterality: Right;   TUBAL LIGATION     VAGINAL HYSTERECTOMY     Social History   Occupational History   Not on file  Tobacco Use   Smoking status: Never   Smokeless tobacco: Never  Vaping Use   Vaping Use: Never used  Substance and Sexual Activity   Alcohol use: No   Drug use: No   Sexual activity: Yes

## 2022-04-17 ENCOUNTER — Encounter: Payer: Self-pay | Admitting: Orthopaedic Surgery

## 2022-04-17 LAB — SYNOVIAL FLUID ANALYSIS, COMPLETE
Basophils, %: 0 %
Eosinophils-Synovial: 0 % (ref 0–2)
Lymphocytes-Synovial Fld: 20 % (ref 0–74)
Monocyte/Macrophage: 0 % (ref 0–69)
Neutrophil, Synovial: 80 % — ABNORMAL HIGH (ref 0–24)
Synoviocytes, %: 0 % (ref 0–15)
WBC, Synovial: 1154 cells/uL — ABNORMAL HIGH (ref <150)

## 2022-04-17 LAB — ANAEROBIC AND AEROBIC CULTURE
AER RESULT:: NO GROWTH
MICRO NUMBER:: 14093254
MICRO NUMBER:: 14093255
SPECIMEN QUALITY:: ADEQUATE
SPECIMEN QUALITY:: ADEQUATE

## 2022-05-03 ENCOUNTER — Other Ambulatory Visit (HOSPITAL_COMMUNITY): Payer: Self-pay

## 2022-05-05 ENCOUNTER — Ambulatory Visit: Payer: No Typology Code available for payment source | Admitting: Sports Medicine

## 2022-05-05 ENCOUNTER — Encounter: Payer: Self-pay | Admitting: Sports Medicine

## 2022-05-05 ENCOUNTER — Ambulatory Visit: Payer: Self-pay

## 2022-05-05 DIAGNOSIS — M25461 Effusion, right knee: Secondary | ICD-10-CM | POA: Diagnosis not present

## 2022-05-05 DIAGNOSIS — M25561 Pain in right knee: Secondary | ICD-10-CM | POA: Diagnosis not present

## 2022-05-05 DIAGNOSIS — M7121 Synovial cyst of popliteal space [Baker], right knee: Secondary | ICD-10-CM

## 2022-05-05 DIAGNOSIS — Z96651 Presence of right artificial knee joint: Secondary | ICD-10-CM

## 2022-05-05 MED ORDER — LIDOCAINE HCL 1 % IJ SOLN
3.0000 mL | INTRAMUSCULAR | Status: AC | PRN
  Administered 2022-05-05: 3 mL

## 2022-05-05 NOTE — Progress Notes (Signed)
   Procedure Note  Patient: Samantha Curtis             Date of Birth: Mar 02, 1966           MRN: [id number]             Visit Date: 05/05/2022  Procedures: Visit Diagnoses:  1. Status post revision of total replacement of right knee   2. Pain and swelling of right knee   3. Baker's cyst of knee, right    *Procedure: Right Baker's Cyst, Aspiration Large Joint Inj: R knee on 05/05/2022 2:39 PM Details: 22 G 1.5 in needle, ultrasound-guided posterior approach Medications: 3 mL lidocaine 1 % Aspirate: 9 mL clear and blood-tinged Outcome: tolerated well, no immediate complications  After discussion on risks/benefits/indications, informed verbal consent was obtained and a timeout was performed. The patient was lying prone on exam table and the patient's knee was prepped with betadine and alcohol swabs. Utilizing ultrasound-guidance with the probe in a longitudinal position, the Baker's cyst was identified and the surrounding soft tissue area was anesthesized first with a 25G, 1.5" needle with approximately 3 cc of lidocaine 1% using sterile technique. Following this, using ultrasound guidance via an in-plane approach, an 18G, 1.5" needle was directed into cyst and approximately 9 cc's of blood-tinged fluid was aspirated from the cyst and posterior knee joint. No medicine was injected into the knee joint. Patient tolerated the procedure well without immediate complications.   Procedure, treatment alternatives, risks and benefits explained, specific risks discussed. Consent was given by the patient. Immediately prior to procedure a time out was called to verify the correct patient, procedure, equipment, support staff and site/side marked as required. Patient was prepped and draped in the usual sterile fashion.        - I evaluated the patient about 10 minutes post-injection and she had improvement in swelling and pain within the knee - she will ice and maintain compression sleeve - follow-up with  Dr. Roda Shutters as indicated; I am happy to see them as needed  Madelyn Brunner, DO Primary Care Sports Medicine Physician  [facility] - Orthopedics  This note was dictated using Dragon naturally speaking software and may contain errors in syntax, spelling, or content which have not been identified prior to signing this note.

## 2022-05-05 NOTE — Patient Instructions (Signed)
Leocadia, it was great to meet you today, thank you for letting me participate in your care.  We did drain the Baker's cyst from the knee today.  - Ice for 20 mins 2-3x a day - Wear knee compressive sleeve when up and walking - May take NSAID's as needed  You will follow-up Dr. Roda Shutters to discuss next steps.  If you have any further questions, please give the clinic a call [phone].  Madelyn Brunner, DO Primary Care Sports Medicine Physician  [facility] - Orthopedics

## 2022-05-31 ENCOUNTER — Ambulatory Visit
Admission: RE | Admit: 2022-05-31 | Discharge: 2022-05-31 | Disposition: A | Discharge status: Home / Self Care | Payer: No Typology Code available for payment source | Source: Ambulatory Visit | Attending: Obstetrics and Gynecology | Admitting: Obstetrics and Gynecology

## 2022-05-31 DIAGNOSIS — Z1231 Encounter for screening mammogram for malignant neoplasm of breast: Secondary | ICD-10-CM

## 2022-06-14 ENCOUNTER — Ambulatory Visit (INDEPENDENT_AMBULATORY_CARE_PROVIDER_SITE_OTHER): Payer: No Typology Code available for payment source | Admitting: Orthopaedic Surgery

## 2022-06-14 ENCOUNTER — Encounter: Payer: Self-pay | Admitting: Orthopaedic Surgery

## 2022-06-14 ENCOUNTER — Telehealth: Payer: Self-pay | Admitting: Orthopaedic Surgery

## 2022-06-14 DIAGNOSIS — M7121 Synovial cyst of popliteal space [Baker], right knee: Secondary | ICD-10-CM

## 2022-06-14 DIAGNOSIS — M25461 Effusion, right knee: Secondary | ICD-10-CM | POA: Diagnosis not present

## 2022-06-14 DIAGNOSIS — M25561 Pain in right knee: Secondary | ICD-10-CM

## 2022-06-14 DIAGNOSIS — Z96651 Presence of right artificial knee joint: Secondary | ICD-10-CM

## 2022-06-14 NOTE — Progress Notes (Signed)
Office Visit Note   Patient: Samantha Curtis           Date of Birth: Apr 23, 1966           MRN: [id number] Visit Date: 06/14/2022              Requested by: Kirby Funk, MD [address] [place],  [place] [address] PCP: Kirby Funk, MD   Assessment & Plan: Visit Diagnoses:  1. Status post revision of total replacement of right knee   2. Baker's cyst of knee, right   3. Pain and swelling of right knee     Plan: Unfortunately Ron has continues to struggle with symptoms of pain and swelling despite the revision surgery that was about 9 months ago.  She is having a tough time going back to work because of its physical nature.  She is required to stand for prolonged periods of time.  I can tell that she is in a tremendous amount of discomfort.  Her workup for infection has been negative.  At this point I feel that she would benefit from time away from work to allow her body to rest.  We have written her out for 8 weeks.  Recheck at that time.  Follow-Up Instructions: Return in about 8 weeks (around 08/09/2022).   Orders:  No orders of the defined types were placed in this encounter.  No orders of the defined types were placed in this encounter.     Procedures: No procedures performed   Clinical Data: No additional findings.   Subjective: Chief Complaint  Patient presents with   Right Knee - Follow-up    Right total knee arthroplasty 08/26/2021    HPI Bjorn Loser returns today for follow-up status post right total knee revision of femoral component on 08/26/2021.  She continues to have constant symptoms of pain and swelling during work and after work.  She is not able to work a full shift without symptoms.  She is struggling quite a bit.  She is physically drained after a full shift due to the knee.  Review of Systems   Objective: Vital Signs: There were no vitals taken for this visit.  Physical Exam  Ortho Exam Examination of right knee shows a fully healed  surgical scar.  No evidence of infection.  Good varus valgus stability.  She has pain with range of motion.  She has a moderate joint effusion. Specialty Comments:  No specialty comments available.  Imaging: No results found.   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) 08/26/2021   Status post revision of total replacement of right knee 08/26/2021   Status post total right knee replacement 08/04/2021   Allergic rhinitis 04/27/2021   Body mass index (BMI) of 25.0 to 29.9 04/27/2021   Cardiomegaly 04/27/2021   Cough variant asthma 04/27/2021   Gastroduodenitis 04/27/2021   Osteoarthritis of knee 04/27/2021   Pure hypercholesterolemia 04/27/2021   Restless legs 04/27/2021   Ventricular premature depolarization 04/27/2021   S/P TKR (total knee replacement), bilateral    Gastroesophageal reflux disease    Post-operative pain    Elevated blood pressure reading    Bilateral primary osteoarthritis of knee 07/03/2017   Chest pain 08/31/2014   Palpitations 08/31/2014   Lateral epicondylitis of right elbow 04/02/2013   Past Medical History:  Diagnosis Date   Arthritis    "knees" (07/04/2017)   Complication of  anesthesia    Dysrhythmia    PALPITATIONS   GERD (gastroesophageal reflux disease)    High cholesterol    Leg pain    PONV (postoperative nausea and vomiting)     Family History  Problem Relation Age of Onset   Dementia Mother     Past Surgical History:  Procedure Laterality Date   ENDOVENOUS ABLATION SAPHENOUS VEIN W/ LASER Left 2005   [facility]   JOINT REPLACEMENT     KNEE ARTHROSCOPY Right    SHOULDER ARTHROSCOPY Right    "spurs"   SHOULDER ARTHROSCOPY Right 06/24/2019   Procedure: RIGHT SHOULDER ARTHROSCOPY; CHONDROPLASTY AND DEBRIEDMENT;  Surgeon: Marcene Corning, MD;  Location: [facility];  Service: Orthopedics;  Laterality: Right;   TOTAL KNEE ARTHROPLASTY Bilateral  07/03/2017   Procedure: TOTAL KNEE BILATERAL;  Surgeon: Marcene Corning, MD;  [facility];  Service: Orthopedics;  Laterality: Bilateral;   TOTAL KNEE REVISION Right 08/26/2021   Procedure: RIGHT TOTAL KNEE REVISION;  Surgeon: Tarry Kos, MD;  [facility];  Service: Orthopedics;  Laterality: Right;   TUBAL LIGATION     VAGINAL HYSTERECTOMY     Social History   Occupational History   Not on file  Tobacco Use   Smoking status: Never   Smokeless tobacco: Never  Vaping Use   Vaping Use: Never used  Substance and Sexual Activity   Alcohol use: No   Drug use: No   Sexual activity: Yes

## 2022-06-14 NOTE — Telephone Encounter (Signed)
[employer] forms received. To [employer]. 

## 2022-06-20 ENCOUNTER — Telehealth: Payer: Self-pay | Admitting: Orthopaedic Surgery

## 2022-06-20 NOTE — Telephone Encounter (Signed)
errro

## 2022-06-28 ENCOUNTER — Other Ambulatory Visit (HOSPITAL_COMMUNITY): Payer: Self-pay

## 2022-07-05 ENCOUNTER — Other Ambulatory Visit (HOSPITAL_COMMUNITY): Payer: Self-pay

## 2022-07-12 ENCOUNTER — Other Ambulatory Visit (HOSPITAL_COMMUNITY): Payer: Self-pay

## 2022-07-13 ENCOUNTER — Other Ambulatory Visit (HOSPITAL_COMMUNITY): Payer: Self-pay

## 2022-07-13 MED ORDER — FINASTERIDE 1 MG PO TABS
2.0000 mg | ORAL_TABLET | Freq: Every day | ORAL | 11 refills | Status: DC
  Filled 2022-07-13: qty 60, 30d supply, fill #0
  Filled 2022-08-12: qty 60, 30d supply, fill #1

## 2022-07-18 ENCOUNTER — Other Ambulatory Visit (HOSPITAL_COMMUNITY): Payer: Self-pay

## 2022-08-02 ENCOUNTER — Telehealth: Payer: Self-pay | Admitting: Orthopaedic Surgery

## 2022-08-02 ENCOUNTER — Encounter: Payer: Self-pay | Admitting: Orthopaedic Surgery

## 2022-08-02 ENCOUNTER — Ambulatory Visit (INDEPENDENT_AMBULATORY_CARE_PROVIDER_SITE_OTHER): Payer: 59 | Admitting: Orthopaedic Surgery

## 2022-08-02 DIAGNOSIS — Z96651 Presence of right artificial knee joint: Secondary | ICD-10-CM | POA: Diagnosis not present

## 2022-08-02 NOTE — Progress Notes (Signed)
Office Visit Note   Patient: Samantha Curtis           Date of Birth: 04/29/1966           MRN: [id number] Visit Date: 08/02/2022              Requested by: Lavone Orn, MD [address] [place],  [place] [address] PCP: Lavone Orn, MD   Assessment & Plan: Visit Diagnoses:  1. Status post revision of total replacement of right knee     Plan: Impression is continuing to right knee pain to the lateral side.  We will repeat bone scan to look for loosening of the tibial tray.  Wear for 4 weeks.  Follow-up after the bone scan.  Follow-Up Instructions: Return in about 4 weeks (around 08/30/2022).   Orders:  Orders Placed This Encounter  Procedures   NM Bone Scan 3 Phase   No orders of the defined types were placed in this encounter.     Procedures: No procedures performed   Clinical Data: No additional findings.   Subjective: Chief Complaint  Patient presents with   Right Knee - Follow-up    Right total knee revision 08/26/2021    HPI  Patient returns today for follow-up of the right knee pain status post total knee revision in August 26, 2021.  She has felt significant improvement only when she is resting it.  She continues to have stabbing lateral knee pain especially with going down steps.  Continues to have symptomatic swelling and Baker's cyst.  Review of Systems   Objective: Vital Signs: There were no vitals taken for this visit.  Physical Exam  Ortho Exam  Examination right knee shows joint effusion and Baker's cyst.  Lateral joint line tenderness.  Specialty Comments:  No specialty comments available.  Imaging: No results found.   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 ([place]) 08/26/2021   Status post revision of total replacement of right knee 08/26/2021   Status post total right knee replacement 08/04/2021   Allergic rhinitis 04/27/2021   Body  mass index (BMI) of 25.0 to 29.9 04/27/2021   Cardiomegaly 04/27/2021   Cough variant asthma 04/27/2021   Gastroduodenitis 04/27/2021   Osteoarthritis of knee 04/27/2021   Pure hypercholesterolemia 04/27/2021   Restless legs 04/27/2021   Ventricular premature depolarization 04/27/2021   S/P TKR (total knee replacement), bilateral    Gastroesophageal reflux disease    Post-operative pain    Elevated blood pressure reading    Bilateral primary osteoarthritis of knee 07/03/2017   Chest pain 08/31/2014   Palpitations 08/31/2014   Lateral epicondylitis of right elbow 04/02/2013   Past Medical History:  Diagnosis Date   Arthritis    "knees" (A999333)   Complication of anesthesia    Dysrhythmia    PALPITATIONS   GERD (gastroesophageal reflux disease)    High cholesterol    Leg pain    PONV (postoperative nausea and vomiting)     Family History  Problem Relation Age of Onset   Dementia Mother     Past Surgical History:  Procedure Laterality Date   ENDOVENOUS ABLATION SAPHENOUS VEIN W/ LASER Left 2005   [facility]   JOINT REPLACEMENT     KNEE ARTHROSCOPY Right    SHOULDER ARTHROSCOPY Right    "spurs"   SHOULDER ARTHROSCOPY Right 06/24/2019   Procedure: RIGHT SHOULDER ARTHROSCOPY; CHONDROPLASTY AND DEBRIEDMENT;  Surgeon: Melrose Nakayama, MD;  [facility];  Service: Orthopedics;  Laterality: Right;   TOTAL KNEE ARTHROPLASTY Bilateral 07/03/2017   Procedure: TOTAL KNEE BILATERAL;  Surgeon: Melrose Nakayama, MD;  Location: [place];  Service: Orthopedics;  Laterality: Bilateral;   TOTAL KNEE REVISION Right 08/26/2021   Procedure: RIGHT TOTAL KNEE REVISION;  Surgeon: Leandrew Koyanagi, MD;  Location: [facility];  Service: Orthopedics;  Laterality: Right;   TUBAL LIGATION     VAGINAL HYSTERECTOMY     Social History   Occupational History   Not on file  Tobacco Use   Smoking status: Never   Smokeless tobacco: Never  Vaping Use   Vaping Use: Never used  Substance and Sexual  Activity   Alcohol use: No   Drug use: No   Sexual activity: Yes

## 2022-08-02 NOTE — Telephone Encounter (Signed)
[employer] forms received. To [employer].

## 2022-08-04 ENCOUNTER — Other Ambulatory Visit: Payer: Self-pay | Admitting: Internal Medicine

## 2022-08-04 DIAGNOSIS — M8580 Other specified disorders of bone density and structure, unspecified site: Secondary | ICD-10-CM

## 2022-08-14 ENCOUNTER — Other Ambulatory Visit (HOSPITAL_COMMUNITY): Payer: Self-pay

## 2022-08-21 ENCOUNTER — Encounter (HOSPITAL_COMMUNITY): Payer: 59

## 2022-08-21 ENCOUNTER — Other Ambulatory Visit (HOSPITAL_COMMUNITY): Payer: 59

## 2022-08-23 ENCOUNTER — Encounter (HOSPITAL_COMMUNITY): Payer: 59

## 2022-08-23 ENCOUNTER — Other Ambulatory Visit (HOSPITAL_COMMUNITY): Payer: 59

## 2022-08-24 ENCOUNTER — Encounter (HOSPITAL_COMMUNITY)
Admission: RE | Admit: 2022-08-24 | Discharge: 2022-08-24 | Disposition: A | Discharge status: Home / Self Care | Payer: 59 | Source: Ambulatory Visit | Attending: Orthopaedic Surgery | Admitting: Orthopaedic Surgery

## 2022-08-24 DIAGNOSIS — Z96651 Presence of right artificial knee joint: Secondary | ICD-10-CM

## 2022-08-24 DIAGNOSIS — M25561 Pain in right knee: Secondary | ICD-10-CM | POA: Diagnosis not present

## 2022-08-24 MED ORDER — TECHNETIUM TC 99M MEDRONATE IV KIT
20.0000 | PACK | Freq: Once | INTRAVENOUS | Status: AC | PRN
  Administered 2022-08-24: 21 via INTRAVENOUS

## 2022-08-28 ENCOUNTER — Other Ambulatory Visit: Payer: 59

## 2022-08-29 ENCOUNTER — Encounter: Payer: Self-pay | Admitting: Orthopaedic Surgery

## 2022-08-29 ENCOUNTER — Ambulatory Visit: Payer: 59 | Admitting: Orthopaedic Surgery

## 2022-08-29 DIAGNOSIS — Z96651 Presence of right artificial knee joint: Secondary | ICD-10-CM

## 2022-08-29 NOTE — Progress Notes (Signed)
Office Visit Note   Patient: Samantha Curtis           Date of Birth: 19-Apr-1966           MRN: [id number] Visit Date: 08/29/2022              Requested by: Lavone Orn, MD [address] [place],  [place] [address] PCP: Kathalene Frames, MD   Assessment & Plan: Visit Diagnoses:  1. Status post revision of total replacement of right knee     Plan: Bone scan of the right knee does not show any definitive loosening of the prosthesis.  At this point she has ongoing right knee pain and effusion despite aggressive conservative management.  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  She will begin the process of filing for disability.  Follow-Up Instructions: Return in about 1 year (around 08/29/2023).   Orders:  No orders of the defined types were placed in this encounter.  No orders of the defined types were placed in this encounter.     Procedures: No procedures performed   Clinical Data: No additional findings.   Subjective: Chief Complaint  Patient presents with   Right Knee - Follow-up    Right total knee revision 08/26/2021    HPI  Patient returns today to discuss bone scan results.  Denies any changes in symptoms.  Review of Systems   Objective: Vital Signs: There were no vitals taken for this visit.  Physical Exam  Ortho Exam  Examination right knee shows fully healed surgical scar.  No cellulitis or warmth.  Moderate joint effusion.  Pain with range of motion.  Specialty Comments:  No specialty comments available.  Imaging: No results found.   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 ([place]) 08/26/2021   Status post revision of total replacement of right knee 08/26/2021   Status post total right knee replacement 08/04/2021   Allergic rhinitis 04/27/2021    Body mass index (BMI) of 25.0 to 29.9 04/27/2021   Cardiomegaly 04/27/2021   Cough variant asthma 04/27/2021   Gastroduodenitis 04/27/2021   Osteoarthritis of knee 04/27/2021   Pure hypercholesterolemia 04/27/2021   Restless legs 04/27/2021   Ventricular premature depolarization 04/27/2021   S/P TKR (total knee replacement), bilateral    Gastroesophageal reflux disease    Post-operative pain    Elevated blood pressure reading    Bilateral primary osteoarthritis of knee 07/03/2017   Chest pain 08/31/2014   Palpitations 08/31/2014   Lateral epicondylitis of right elbow 04/02/2013   Past Medical History:  Diagnosis Date   Arthritis    "knees" (A999333)   Complication of anesthesia    Dysrhythmia    PALPITATIONS   GERD (gastroesophageal reflux disease)    High cholesterol    Leg pain    PONV (postoperative nausea and vomiting)     Family History  Problem Relation Age of Onset   Dementia Mother     Past Surgical History:  Procedure Laterality Date   ENDOVENOUS ABLATION SAPHENOUS VEIN W/ LASER Left 2005   [facility]   JOINT REPLACEMENT     KNEE ARTHROSCOPY Right    SHOULDER ARTHROSCOPY Right    "spurs"   SHOULDER ARTHROSCOPY Right 06/24/2019   Procedure: RIGHT SHOULDER ARTHROSCOPY; CHONDROPLASTY  AND DEBRIEDMENT;  Surgeon: Melrose Nakayama, MD;  [facility];  Service: Orthopedics;  Laterality: Right;   TOTAL KNEE ARTHROPLASTY Bilateral 07/03/2017   Procedure: TOTAL KNEE BILATERAL;  Surgeon: Melrose Nakayama, MD;  Location: [facility];  Service: Orthopedics;  Laterality: Bilateral;   TOTAL KNEE REVISION Right 08/26/2021   Procedure: RIGHT TOTAL KNEE REVISION;  Surgeon: Leandrew Koyanagi, MD;  Location: [facility];  Service: Orthopedics;  Laterality: Right;   TUBAL LIGATION     VAGINAL HYSTERECTOMY     Social History   Occupational History   Not on file  Tobacco Use   Smoking status: Never   Smokeless tobacco: Never  Vaping Use   Vaping Use: Never used  Substance and  Sexual Activity   Alcohol use: No   Drug use: No   Sexual activity: Yes

## 2022-08-30 ENCOUNTER — Telehealth: Payer: Self-pay | Admitting: Orthopaedic Surgery

## 2022-08-30 NOTE — Telephone Encounter (Signed)
[employer] forms received. To [employer]. 

## 2022-08-31 ENCOUNTER — Telehealth: Payer: Self-pay | Admitting: Orthopaedic Surgery

## 2022-08-31 ENCOUNTER — Encounter: Payer: Self-pay | Admitting: Orthopaedic Surgery

## 2022-08-31 ENCOUNTER — Ambulatory Visit: Payer: 59 | Admitting: Orthopaedic Surgery

## 2022-08-31 NOTE — Telephone Encounter (Signed)
Received a call from patient. She needs a note for her employer that states why she is unable to work. Explain in detail her knee condition, and is unable to do any prolonged walking, standing, sitting, kneeling,squatting, bending. And that her knee condition interrupts her sleep due to pain and swelling. Please advise when note is in chart. Thank you!

## 2022-09-04 ENCOUNTER — Telehealth: Payer: Self-pay | Admitting: Orthopaedic Surgery

## 2022-09-04 NOTE — Telephone Encounter (Signed)
I spoke to her and clarified why I can't say she can't do all jobs.  We'll keep the letter the way it is.  Thanks.

## 2022-09-04 NOTE — Telephone Encounter (Signed)
Tried to call. No answer. No voicemail.  

## 2022-09-04 NOTE — Telephone Encounter (Signed)
Received call from patient regarding the work note from 08/31/22. 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 Please call when ready at [phone]. She states she must have today as her deadline is tomorrow.

## 2022-09-04 NOTE — Telephone Encounter (Signed)
Patient states she need to speak to Lauren about he letter that was sent about her disability

## 2022-09-07 ENCOUNTER — Other Ambulatory Visit: Payer: 59

## 2022-09-08 DIAGNOSIS — M810 Age-related osteoporosis without current pathological fracture: Secondary | ICD-10-CM | POA: Diagnosis not present

## 2022-09-11 ENCOUNTER — Other Ambulatory Visit (HOSPITAL_COMMUNITY): Payer: Self-pay

## 2022-09-11 MED ORDER — ERGOCALCIFEROL 1.25 MG (50000 UT) PO CAPS
50000.0000 [IU] | ORAL_CAPSULE | ORAL | 0 refills | Status: AC
  Filled 2022-09-11: qty 12, 84d supply, fill #0

## 2022-11-08 ENCOUNTER — Other Ambulatory Visit (HOSPITAL_COMMUNITY): Payer: Self-pay

## 2022-11-23 ENCOUNTER — Encounter: Payer: Self-pay | Admitting: Orthopaedic Surgery

## 2022-11-23 NOTE — Telephone Encounter (Signed)
fyi

## 2023-01-02 ENCOUNTER — Ambulatory Visit (INDEPENDENT_AMBULATORY_CARE_PROVIDER_SITE_OTHER): Payer: Self-pay | Admitting: Physician Assistant

## 2023-01-02 DIAGNOSIS — Z96651 Presence of right artificial knee joint: Secondary | ICD-10-CM

## 2023-01-02 NOTE — Progress Notes (Signed)
Post-Op Visit Note   Patient: Samantha Curtis           Date of Birth: 1965-07-27           MRN: [id number] Visit Date: 01/02/2023 PCP: Emilio Aspen, MD   Assessment & Plan:  Chief Complaint:  Chief Complaint  Patient presents with   Right Knee - Follow-up    Right total knee revision 08/26/2021   Visit Diagnoses:  1. Status post revision of total replacement of right knee     Plan: Patient is a pleasant 57 year old female who comes in today almost 1-1/2-year status post right total knee revision 08/26/2021.  She has continued to experience pain and weakness which has progressively worsened.  The pain she has is primarily going from a seated to standing position as well as when she is standing, sitting or walking too long.  At times, she feels pressure to the distal femur and feels as though it may snap.  She has been taking muscle relaxers, Tylenol and Advil without relief.  Examination of her right knee reveals a small effusion.  Range of motion 0 to 120 degrees.  She is stable to valgus varus stress.  She does have 3 out of 5 strength with resisted straight leg raise.  She is neurovascularly intact distally.  At this point, I have provided her with a home exercise program to continue working on quad strengthening exercises.  She will turn in her disability paperwork.  She will see [place] in 6 months for recheck.  Call with concerns or questions in the meantime.  Follow-Up Instructions: Return in about 6 months (around 07/05/2023).   Orders:  No orders of the defined types were placed in this encounter.  No orders of the defined types were placed in this encounter.   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/2021   S/P TKR (total knee replacement), bilateral    Gastroesophageal reflux disease    Post-operative pain    Elevated blood pressure reading    Bilateral primary osteoarthritis of knee 07/03/2017   Chest pain 08/31/2014   Palpitations 08/31/2014   Lateral epicondylitis of right elbow 04/02/2013   Past Medical History:  Diagnosis Date   Arthritis    "knees" (07/04/2017)   Complication of anesthesia    Dysrhythmia    PALPITATIONS   GERD (gastroesophageal reflux disease)    High cholesterol    Leg pain    PONV (postoperative nausea and vomiting)     Family History  Problem Relation Age of Onset   Dementia Mother     Past Surgical History:  Procedure Laterality Date   ENDOVENOUS ABLATION SAPHENOUS VEIN W/ LASER Left 2005   [facility]   JOINT REPLACEMENT     KNEE ARTHROSCOPY Right    SHOULDER ARTHROSCOPY Right    "spurs"   SHOULDER ARTHROSCOPY Right 06/24/2019   Procedure: RIGHT SHOULDER ARTHROSCOPY; CHONDROPLASTY AND DEBRIEDMENT;  Surgeon: Marcene Corning,  MD;  [facility];  Service: Orthopedics;  Laterality: Right;   TOTAL KNEE ARTHROPLASTY Bilateral 07/03/2017   Procedure: TOTAL KNEE BILATERAL;  Surgeon: Marcene Corning, MD;  [facility];  Service: Orthopedics;  Laterality: Bilateral;   TOTAL KNEE REVISION Right 08/26/2021   Procedure: RIGHT TOTAL KNEE REVISION;  Surgeon: Tarry Kos, MD;  [facility];  Service: Orthopedics;  Laterality: Right;   TUBAL LIGATION     VAGINAL HYSTERECTOMY     Social History   Occupational History   Not on file  Tobacco Use   Smoking status: Never   Smokeless tobacco: Never  Vaping Use   Vaping status: Never Used   Substance and Sexual Activity   Alcohol use: No   Drug use: No   Sexual activity: Yes

## 2023-01-04 ENCOUNTER — Encounter: Payer: Self-pay | Admitting: Orthopaedic Surgery

## 2023-01-04 ENCOUNTER — Ambulatory Visit: Payer: Self-pay | Admitting: Orthopaedic Surgery

## 2023-01-08 NOTE — Telephone Encounter (Signed)
Samantha Curtis is fine.

## 2023-01-30 ENCOUNTER — Other Ambulatory Visit (HOSPITAL_COMMUNITY): Payer: Self-pay

## 2023-01-30 MED ORDER — ATORVASTATIN CALCIUM 10 MG PO TABS
10.0000 mg | ORAL_TABLET | Freq: Every day | ORAL | 3 refills | Status: AC
  Filled 2023-01-30: qty 90, 90d supply, fill #0
  Filled 2023-05-15: qty 90, 90d supply, fill #1
  Filled 2023-05-16: qty 90, 90d supply, fill #0
  Filled 2023-05-16 – 2023-08-09 (×2): qty 90, 90d supply, fill #1
  Filled 2023-10-24: qty 90, 90d supply, fill #2

## 2023-01-30 MED ORDER — METOPROLOL TARTRATE 25 MG PO TABS
25.0000 mg | ORAL_TABLET | Freq: Every day | ORAL | 3 refills | Status: AC
  Filled 2023-01-30: qty 90, 90d supply, fill #0
  Filled 2023-05-15: qty 90, 90d supply, fill #1
  Filled 2023-05-16: qty 90, 90d supply, fill #0
  Filled 2023-05-16 – 2023-08-09 (×2): qty 90, 90d supply, fill #1
  Filled 2023-10-24: qty 90, 90d supply, fill #2

## 2023-02-23 ENCOUNTER — Ambulatory Visit: Payer: Self-pay | Admitting: Orthopaedic Surgery

## 2023-03-05 ENCOUNTER — Other Ambulatory Visit (HOSPITAL_COMMUNITY): Payer: Self-pay

## 2023-04-04 ENCOUNTER — Other Ambulatory Visit (HOSPITAL_COMMUNITY): Payer: Self-pay

## 2023-04-17 ENCOUNTER — Other Ambulatory Visit (HOSPITAL_BASED_OUTPATIENT_CLINIC_OR_DEPARTMENT_OTHER): Payer: Self-pay

## 2023-04-23 ENCOUNTER — Other Ambulatory Visit (HOSPITAL_BASED_OUTPATIENT_CLINIC_OR_DEPARTMENT_OTHER): Payer: Self-pay

## 2023-04-23 ENCOUNTER — Other Ambulatory Visit (HOSPITAL_COMMUNITY): Payer: Self-pay

## 2023-04-23 MED ORDER — METHOCARBAMOL 500 MG PO TABS
500.0000 mg | ORAL_TABLET | Freq: Two times a day (BID) | ORAL | 0 refills | Status: AC | PRN
  Filled 2023-04-23 – 2023-07-07 (×2): qty 60, 30d supply, fill #0

## 2023-04-23 MED ORDER — TRAMADOL HCL 50 MG PO TABS
50.0000 mg | ORAL_TABLET | Freq: Two times a day (BID) | ORAL | 0 refills | Status: AC | PRN
  Filled 2023-04-23 – 2023-04-24 (×2): qty 60, 30d supply, fill #0

## 2023-04-24 ENCOUNTER — Other Ambulatory Visit (HOSPITAL_COMMUNITY): Payer: Self-pay

## 2023-04-24 ENCOUNTER — Other Ambulatory Visit (HOSPITAL_BASED_OUTPATIENT_CLINIC_OR_DEPARTMENT_OTHER): Payer: Self-pay

## 2023-04-26 ENCOUNTER — Other Ambulatory Visit (HOSPITAL_COMMUNITY): Payer: Self-pay

## 2023-05-09 ENCOUNTER — Other Ambulatory Visit (HOSPITAL_BASED_OUTPATIENT_CLINIC_OR_DEPARTMENT_OTHER): Payer: Self-pay

## 2023-05-09 ENCOUNTER — Other Ambulatory Visit: Payer: Self-pay | Admitting: Orthopaedic Surgery

## 2023-05-15 ENCOUNTER — Other Ambulatory Visit (HOSPITAL_COMMUNITY): Payer: Self-pay

## 2023-05-16 ENCOUNTER — Other Ambulatory Visit (HOSPITAL_COMMUNITY): Payer: Self-pay

## 2023-05-16 ENCOUNTER — Other Ambulatory Visit (HOSPITAL_BASED_OUTPATIENT_CLINIC_OR_DEPARTMENT_OTHER): Payer: Self-pay

## 2023-06-19 ENCOUNTER — Other Ambulatory Visit (HOSPITAL_BASED_OUTPATIENT_CLINIC_OR_DEPARTMENT_OTHER): Payer: Self-pay

## 2023-07-02 NOTE — Progress Notes (Signed)
 Office Visit Note   Patient: Samantha Curtis           Date of Birth: April 24, 1966           MRN: [id number] Visit Date: 07/03/2023              Requested by: Charlott Dorn LABOR, MD [address] [place],  [place] [address] PCP: Charlott Dorn LABOR, MD   Assessment & Plan: Visit Diagnoses:  1. Loosening of prosthesis of right total knee replacement, initial encounter (HCC)   2. Status post revision of total replacement of right knee     Plan: Patient continues to have chronic right knee pain status post femoral component revision.  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  Will see her back in about 5 months.  Follow-Up Instructions: Return in about 5 months (around 12/01/2023).   Orders:  Orders Placed This Encounter  Procedures   XR Knee 1-2 Views Right   No orders of the defined types were placed in this encounter.     Procedures: No procedures performed   Clinical Data: No additional findings.   Subjective: Chief Complaint  Patient presents with   Right Knee - Follow-up    Right total knee revision 08/26/21    HPI Dalary returns today for a follow-up evaluation of chronic right knee pain.  She is status post femoral component revision on 08/26/2021.  She is currently out of work.  Continues to endorse frequent spasms and symptomatic Baker's cyst.  Unable to work.  Has excruciating pain after standing for 2 hours.  Unable to sleep at night. Review of Systems  Constitutional: Negative.   HENT: Negative.    Eyes: Negative.   Respiratory: Negative.    Cardiovascular: Negative.   Endocrine: Negative.   Musculoskeletal: Negative.   Neurological: Negative.   Hematological: Negative.   Psychiatric/Behavioral: Negative.    All other systems reviewed and are negative.    Objective: Vital Signs: There were no vitals taken for this visit.  Physical Exam Vitals and nursing note reviewed.  Constitutional:       Appearance: She is well-developed.  HENT:     Head: Normocephalic and atraumatic.  Pulmonary:     Effort: Pulmonary effort is normal.  Abdominal:     Palpations: Abdomen is soft.  Musculoskeletal:     Cervical back: Neck supple.  Skin:    General: Skin is warm.     Capillary Refill: Capillary refill takes less than 2 seconds.  Neurological:     Mental Status: She is alert and oriented to person, place, and time.  Psychiatric:        Behavior: Behavior normal.        Thought Content: Thought content normal.        Judgment: Judgment normal.     Ortho Exam Exam of the right knee shows fully healed surgical scar.  Unchanged from prior exam.  Pain with range of motion. Specialty Comments:  No specialty comments available.  Imaging: No results found.   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) 08/26/2021   Status post revision of total replacement of right knee 08/26/2021   Status post total right knee replacement 08/04/2021   Allergic rhinitis 04/27/2021   Body mass index (BMI) of 25.0 to 29.9 04/27/2021   Cardiomegaly 04/27/2021   Cough variant  asthma 04/27/2021   Gastroduodenitis 04/27/2021   Osteoarthritis of knee 04/27/2021   Pure hypercholesterolemia 04/27/2021   Restless legs 04/27/2021   Ventricular premature depolarization 04/27/2021   S/P TKR (total knee replacement), bilateral    Gastroesophageal reflux disease    Post-operative pain    Elevated blood pressure reading    Bilateral primary osteoarthritis of knee 07/03/2017   Chest pain 08/31/2014   Palpitations 08/31/2014   Lateral epicondylitis of right elbow 04/02/2013   Past Medical History:  Diagnosis Date   Arthritis    knees (07/04/2017)   Complication of anesthesia    Dysrhythmia    PALPITATIONS   GERD (gastroesophageal reflux disease)    High cholesterol    Leg pain    PONV (postoperative nausea and  vomiting)     Family History  Problem Relation Age of Onset   Dementia Mother     Past Surgical History:  Procedure Laterality Date   ENDOVENOUS ABLATION SAPHENOUS VEIN W/ LASER Left 2005    [facility]   JOINT REPLACEMENT     KNEE ARTHROSCOPY Right    SHOULDER ARTHROSCOPY Right    spurs   SHOULDER ARTHROSCOPY Right 06/24/2019   Procedure: RIGHT SHOULDER ARTHROSCOPY; CHONDROPLASTY AND DEBRIEDMENT;  Surgeon: Sheril Coy, MD;  [facility];  Service: Orthopedics;  Laterality: Right;   TOTAL KNEE ARTHROPLASTY Bilateral 07/03/2017   Procedure: TOTAL KNEE BILATERAL;  Surgeon: Sheril Coy, MD;  [facility];  Service: Orthopedics;  Laterality: Bilateral;   TOTAL KNEE REVISION Right 08/26/2021   Procedure: RIGHT TOTAL KNEE REVISION;  Surgeon: Jerri Kay HERO, MD;  [facility];  Service: Orthopedics;  Laterality: Right;   TUBAL LIGATION     VAGINAL HYSTERECTOMY     Social History   Occupational History   Not on file  Tobacco Use   Smoking status: Never   Smokeless tobacco: Never  Vaping Use   Vaping status: Never Used  Substance and Sexual Activity   Alcohol use: No   Drug use: No   Sexual activity: Yes

## 2023-07-03 ENCOUNTER — Ambulatory Visit: Payer: Self-pay | Admitting: Orthopaedic Surgery

## 2023-07-03 ENCOUNTER — Other Ambulatory Visit (INDEPENDENT_AMBULATORY_CARE_PROVIDER_SITE_OTHER): Payer: Self-pay

## 2023-07-03 DIAGNOSIS — T84032A Mechanical loosening of internal right knee prosthetic joint, initial encounter: Secondary | ICD-10-CM

## 2023-07-03 DIAGNOSIS — Z96651 Presence of right artificial knee joint: Secondary | ICD-10-CM

## 2023-07-07 ENCOUNTER — Other Ambulatory Visit (HOSPITAL_BASED_OUTPATIENT_CLINIC_OR_DEPARTMENT_OTHER): Payer: Self-pay

## 2023-07-09 ENCOUNTER — Other Ambulatory Visit (HOSPITAL_BASED_OUTPATIENT_CLINIC_OR_DEPARTMENT_OTHER): Payer: Self-pay

## 2023-07-12 ENCOUNTER — Other Ambulatory Visit (HOSPITAL_BASED_OUTPATIENT_CLINIC_OR_DEPARTMENT_OTHER): Payer: Self-pay

## 2023-07-12 ENCOUNTER — Encounter (HOSPITAL_BASED_OUTPATIENT_CLINIC_OR_DEPARTMENT_OTHER): Payer: Self-pay | Admitting: Pharmacist

## 2023-08-09 ENCOUNTER — Other Ambulatory Visit (HOSPITAL_BASED_OUTPATIENT_CLINIC_OR_DEPARTMENT_OTHER): Payer: Self-pay

## 2023-08-28 ENCOUNTER — Ambulatory Visit: Payer: 59 | Admitting: Orthopaedic Surgery

## 2023-10-22 ENCOUNTER — Other Ambulatory Visit (HOSPITAL_COMMUNITY): Payer: Self-pay

## 2023-10-22 MED ORDER — METHOCARBAMOL 500 MG PO TABS
500.0000 mg | ORAL_TABLET | Freq: Two times a day (BID) | ORAL | 0 refills | Status: AC | PRN
  Filled 2023-10-22 – 2023-10-24 (×2): qty 60, 30d supply, fill #0

## 2023-10-24 ENCOUNTER — Other Ambulatory Visit (HOSPITAL_COMMUNITY): Payer: Self-pay

## 2023-10-24 ENCOUNTER — Other Ambulatory Visit (HOSPITAL_BASED_OUTPATIENT_CLINIC_OR_DEPARTMENT_OTHER): Payer: Self-pay

## 2023-10-24 MED ORDER — ALENDRONATE SODIUM 70 MG PO TABS
70.0000 mg | ORAL_TABLET | ORAL | 3 refills | Status: AC
  Filled 2023-10-24: qty 12, 84d supply, fill #0
  Filled 2024-02-03: qty 12, 84d supply, fill #1
  Filled 2024-04-21: qty 12, 84d supply, fill #2

## 2023-11-27 ENCOUNTER — Other Ambulatory Visit (INDEPENDENT_AMBULATORY_CARE_PROVIDER_SITE_OTHER): Payer: Self-pay

## 2023-11-27 ENCOUNTER — Ambulatory Visit (INDEPENDENT_AMBULATORY_CARE_PROVIDER_SITE_OTHER): Payer: Self-pay | Admitting: Orthopaedic Surgery

## 2023-11-27 DIAGNOSIS — Z96651 Presence of right artificial knee joint: Secondary | ICD-10-CM

## 2023-11-27 DIAGNOSIS — M7121 Synovial cyst of popliteal space [Baker], right knee: Secondary | ICD-10-CM

## 2023-11-27 DIAGNOSIS — M25561 Pain in right knee: Secondary | ICD-10-CM

## 2023-11-27 NOTE — Progress Notes (Signed)
 Office Visit Note   Patient: Samantha Curtis           Date of Birth: 04-08-1966           MRN: [id number] Visit Date: 11/27/2023              Requested by: Benedetta Bradley, MD [address] [place],  [place] [address] PCP: Benedetta Bradley, MD   Assessment & Plan: Visit Diagnoses:  1. Status post revision of total replacement of right knee     Plan: History of Present Illness Samantha Curtis is a 58 year old female with chronic right knee issues status post multiple right knee surgeries.    She experiences significant knee swelling and pain, which worsens with activity and requires frequent sitting and icing. She has difficulty standing quickly and often trips due to insufficient foot lift, especially when wearing flip flops. Random, sharp pain sometimes radiates to her ankle, causing swelling and instability, affecting her ability to descend stairs normally.  Her sleep is severely disrupted, waking every two hours due to discomfort, which limits her activities and impacts her ability to work. 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  She takes Robaxin , Tylenol , and Advil for pain management, with Tramadol  used sparingly due to side effects. She experiences severe pain episodes associated with Baker's cysts a couple of times a month. She has undergone five knee surgeries and has a rotating platform implant.  Examination of right knee shows a fully healed surgical scar.  Continues to have pain throughout range of motion.  Collaterals are stable.  There is swelling and joint effusion.  Baker's cyst is present.  Assessment and Plan Chronic knee pain with effusion and Baker's cyst Chronic knee pain with effusion and recurrent Baker's cysts causing swelling, pain, and instability.  Revision surgical intervention has low yield and high risk. She prefers to avoid surgery. - Order  knee x-ray - Follow up on a yearly basis for repeat evaluation - Based on the severity and frequency of her symptoms I do not feel that she is able to hold out any sort of job.  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 Surgical revision carries significant risks. She prefers conservative management.  Total face to face encounter time was greater than 25 minutes and over half of this time was spent in counseling and/or coordination of care.  Follow-Up Instructions: Return in about 1 year (around 11/26/2024).   Orders:  Orders Placed This Encounter  Procedures   XR Knee 1-2 Views Right   No orders of the defined types were placed in this encounter.  Subjective: Chief Complaint  Patient presents with   Right Knee - Pain    Review of Systems  Constitutional: Negative.   HENT: Negative.    Eyes: Negative.   Respiratory: Negative.    Cardiovascular: Negative.   Endocrine: Negative.   Musculoskeletal: Negative.   Neurological: Negative.   Hematological: Negative.   Psychiatric/Behavioral: Negative.    All other systems reviewed and are negative.    Objective: Vital Signs: There were no vitals taken for this visit.  Physical Exam Vitals and nursing note reviewed.  Constitutional:      Appearance: She is well-developed.  HENT:     Head: Atraumatic.     Nose: Nose normal.  Eyes:  Extraocular Movements: Extraocular movements intact.  Cardiovascular:     Pulses: Normal pulses.  Pulmonary:     Effort: Pulmonary effort is normal.  Abdominal:     Palpations: Abdomen is soft.  Musculoskeletal:     Cervical back: Neck supple.  Skin:    General: Skin is warm.     Capillary Refill: Capillary refill takes less than 2 seconds.  Neurological:     Mental Status: She is alert. Mental status is at baseline.  Psychiatric:        Behavior: Behavior normal.        Thought Content: Thought content normal.         Judgment: Judgment normal.   Imaging: XR Knee 1-2 Views Right Result Date: 11/27/2023 X-rays of the right knee show status post right total knee arthroplasty with revision femoral component.  No interval changes.  Stable lucencies under the tibial baseplate.    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) 08/26/2021   Status post revision of total replacement of right knee 08/26/2021   Status post total right knee replacement 08/04/2021   Allergic rhinitis 04/27/2021   Body mass index (BMI) of 25.0 to 29.9 04/27/2021   Cardiomegaly 04/27/2021   Cough variant asthma 04/27/2021   Gastroduodenitis 04/27/2021   Osteoarthritis of knee 04/27/2021   Pure hypercholesterolemia 04/27/2021   Restless legs 04/27/2021   Ventricular premature depolarization 04/27/2021   S/P TKR (total knee replacement), bilateral    Gastroesophageal reflux disease    Post-operative pain    Elevated blood pressure reading    Bilateral primary osteoarthritis of knee 07/03/2017   Chest pain 08/31/2014   Palpitations 08/31/2014   Lateral epicondylitis of right elbow 04/02/2013   Past Medical History:  Diagnosis Date   Arthritis    "knees" (07/04/2017)   Complication of anesthesia    Dysrhythmia    PALPITATIONS   GERD (gastroesophageal reflux disease)    High cholesterol    Leg pain    PONV (postoperative nausea and vomiting)     Family History  Problem Relation Age of Onset   Dementia Mother     Past Surgical History:  Procedure Laterality Date   ENDOVENOUS ABLATION SAPHENOUS VEIN W/ LASER Left 2005   [facility]   JOINT REPLACEMENT     KNEE ARTHROSCOPY Right    SHOULDER ARTHROSCOPY Right    "spurs"   SHOULDER ARTHROSCOPY Right 06/24/2019   Procedure: RIGHT SHOULDER ARTHROSCOPY; CHONDROPLASTY AND DEBRIEDMENT;  Surgeon: Dayne Even, MD;  Location: [facility];  Service: Orthopedics;  Laterality: Right;    TOTAL KNEE ARTHROPLASTY Bilateral 07/03/2017   Procedure: TOTAL KNEE BILATERAL;  Surgeon: Dayne Even, MD;  Location: [facility];  Service: Orthopedics;  Laterality: Bilateral;   TOTAL KNEE REVISION Right 08/26/2021   Procedure: RIGHT TOTAL KNEE REVISION;  Surgeon: Wes Hamman, MD;  Location: [facility];  Service: Orthopedics;  Laterality: Right;   TUBAL LIGATION     VAGINAL HYSTERECTOMY     Social History   Occupational History   Not on file  Tobacco Use   Smoking status: Never   Smokeless tobacco: Never  Vaping Use   Vaping status: Never Used  Substance and Sexual Activity   Alcohol use: No   Drug use: No   Sexual activity: Yes

## 2023-12-04 ENCOUNTER — Ambulatory Visit: Payer: Self-pay | Admitting: Orthopaedic Surgery

## 2024-01-14 ENCOUNTER — Other Ambulatory Visit (HOSPITAL_BASED_OUTPATIENT_CLINIC_OR_DEPARTMENT_OTHER): Payer: Self-pay

## 2024-02-03 ENCOUNTER — Other Ambulatory Visit (HOSPITAL_BASED_OUTPATIENT_CLINIC_OR_DEPARTMENT_OTHER): Payer: Self-pay

## 2024-02-04 ENCOUNTER — Other Ambulatory Visit (HOSPITAL_BASED_OUTPATIENT_CLINIC_OR_DEPARTMENT_OTHER): Payer: Self-pay

## 2024-02-04 ENCOUNTER — Other Ambulatory Visit: Payer: Self-pay

## 2024-02-04 MED ORDER — ATORVASTATIN CALCIUM 10 MG PO TABS
10.0000 mg | ORAL_TABLET | Freq: Every day | ORAL | 3 refills | Status: AC
  Filled 2024-02-04: qty 90, 90d supply, fill #0

## 2024-02-04 MED ORDER — METOPROLOL TARTRATE 25 MG PO TABS
25.0000 mg | ORAL_TABLET | Freq: Every day | ORAL | 3 refills | Status: AC
  Filled 2024-02-04: qty 90, 90d supply, fill #0
  Filled 2024-05-05: qty 90, 90d supply, fill #1

## 2024-02-16 ENCOUNTER — Other Ambulatory Visit (HOSPITAL_BASED_OUTPATIENT_CLINIC_OR_DEPARTMENT_OTHER): Payer: Self-pay

## 2024-04-21 ENCOUNTER — Encounter: Payer: Self-pay | Admitting: Radiology

## 2024-04-21 ENCOUNTER — Other Ambulatory Visit (HOSPITAL_BASED_OUTPATIENT_CLINIC_OR_DEPARTMENT_OTHER): Payer: Self-pay

## 2024-04-22 ENCOUNTER — Other Ambulatory Visit (HOSPITAL_BASED_OUTPATIENT_CLINIC_OR_DEPARTMENT_OTHER): Payer: Self-pay

## 2024-04-23 ENCOUNTER — Other Ambulatory Visit (HOSPITAL_BASED_OUTPATIENT_CLINIC_OR_DEPARTMENT_OTHER): Payer: Self-pay

## 2024-04-23 MED ORDER — METHOCARBAMOL 500 MG PO TABS
500.0000 mg | ORAL_TABLET | Freq: Two times a day (BID) | ORAL | 0 refills | Status: AC | PRN
  Filled 2024-04-23: qty 60, 30d supply, fill #0

## 2024-04-25 ENCOUNTER — Other Ambulatory Visit (HOSPITAL_BASED_OUTPATIENT_CLINIC_OR_DEPARTMENT_OTHER): Payer: Self-pay

## 2024-05-06 ENCOUNTER — Other Ambulatory Visit (HOSPITAL_BASED_OUTPATIENT_CLINIC_OR_DEPARTMENT_OTHER): Payer: Self-pay

## 2024-05-16 ENCOUNTER — Other Ambulatory Visit (HOSPITAL_BASED_OUTPATIENT_CLINIC_OR_DEPARTMENT_OTHER): Payer: Self-pay

## 2024-05-22 ENCOUNTER — Other Ambulatory Visit (HOSPITAL_COMMUNITY): Payer: Self-pay

## 2024-05-22 ENCOUNTER — Other Ambulatory Visit (HOSPITAL_BASED_OUTPATIENT_CLINIC_OR_DEPARTMENT_OTHER): Payer: Self-pay

## 2024-05-22 MED ORDER — ROSUVASTATIN CALCIUM 5 MG PO TABS
5.0000 mg | ORAL_TABLET | Freq: Every day | ORAL | 3 refills | Status: AC
  Filled 2024-05-22 (×2): qty 90, 90d supply, fill #0

## 2024-07-16 ENCOUNTER — Encounter: Payer: Self-pay | Admitting: Orthopaedic Surgery
